# Patient Record
Sex: Male | Born: 1935 | Race: White | Hispanic: No | Marital: Married | Smoking: Former smoker
Health system: Southern US, Community
[De-identification: ages and names within clinical notes are randomized; demographics above are authoritative.]

## PROBLEM LIST (undated history)

## (undated) DIAGNOSIS — K219 Gastro-esophageal reflux disease without esophagitis: Secondary | ICD-10-CM

## (undated) DIAGNOSIS — R3915 Urgency of urination: Secondary | ICD-10-CM

## (undated) DIAGNOSIS — I10 Essential (primary) hypertension: Secondary | ICD-10-CM

## (undated) DIAGNOSIS — I252 Old myocardial infarction: Secondary | ICD-10-CM

## (undated) DIAGNOSIS — J449 Chronic obstructive pulmonary disease, unspecified: Secondary | ICD-10-CM

## (undated) DIAGNOSIS — N201 Calculus of ureter: Secondary | ICD-10-CM

## (undated) DIAGNOSIS — I440 Atrioventricular block, first degree: Secondary | ICD-10-CM

## (undated) DIAGNOSIS — C61 Malignant neoplasm of prostate: Secondary | ICD-10-CM

## (undated) DIAGNOSIS — E785 Hyperlipidemia, unspecified: Secondary | ICD-10-CM

## (undated) DIAGNOSIS — M1990 Unspecified osteoarthritis, unspecified site: Secondary | ICD-10-CM

## (undated) DIAGNOSIS — Z973 Presence of spectacles and contact lenses: Secondary | ICD-10-CM

## (undated) DIAGNOSIS — J4520 Mild intermittent asthma, uncomplicated: Secondary | ICD-10-CM

## (undated) DIAGNOSIS — L309 Dermatitis, unspecified: Secondary | ICD-10-CM

## (undated) DIAGNOSIS — E039 Hypothyroidism, unspecified: Secondary | ICD-10-CM

## (undated) DIAGNOSIS — N200 Calculus of kidney: Secondary | ICD-10-CM

## (undated) DIAGNOSIS — Z9189 Other specified personal risk factors, not elsewhere classified: Secondary | ICD-10-CM

## (undated) DIAGNOSIS — R600 Localized edema: Secondary | ICD-10-CM

## (undated) DIAGNOSIS — M069 Rheumatoid arthritis, unspecified: Secondary | ICD-10-CM

## (undated) DIAGNOSIS — R350 Frequency of micturition: Secondary | ICD-10-CM

## (undated) DIAGNOSIS — I2510 Atherosclerotic heart disease of native coronary artery without angina pectoris: Secondary | ICD-10-CM

## (undated) DIAGNOSIS — B0050 Herpesviral ocular disease, unspecified: Secondary | ICD-10-CM

## (undated) DIAGNOSIS — R972 Elevated prostate specific antigen [PSA]: Secondary | ICD-10-CM

## (undated) DIAGNOSIS — Z974 Presence of external hearing-aid: Secondary | ICD-10-CM

## (undated) DIAGNOSIS — Z87442 Personal history of urinary calculi: Secondary | ICD-10-CM

## (undated) HISTORY — DX: Elevated prostate specific antigen (PSA): R97.20

## (undated) HISTORY — DX: Atherosclerotic heart disease of native coronary artery without angina pectoris: I25.10

## (undated) HISTORY — PX: CORONARY ANGIOPLASTY WITH STENT PLACEMENT: SHX49

## (undated) HISTORY — PX: TONSILLECTOMY: SUR1361

## (undated) HISTORY — DX: Rheumatoid arthritis, unspecified: M06.9

---

## 1999-09-22 HISTORY — PX: TOTAL HIP ARTHROPLASTY: SHX124

## 2004-09-21 HISTORY — PX: PILONIDAL CYST EXCISION: SHX744

## 2006-10-10 DIAGNOSIS — R972 Elevated prostate specific antigen [PSA]: Secondary | ICD-10-CM

## 2006-10-10 HISTORY — DX: Elevated prostate specific antigen (PSA): R97.20

## 2008-09-21 HISTORY — PX: COLONOSCOPY: SHX174

## 2009-08-07 LAB — HM COLONOSCOPY: HM Colonoscopy: 2010

## 2011-11-27 DIAGNOSIS — L570 Actinic keratosis: Secondary | ICD-10-CM | POA: Diagnosis not present

## 2011-11-27 DIAGNOSIS — B356 Tinea cruris: Secondary | ICD-10-CM | POA: Diagnosis not present

## 2011-12-08 DIAGNOSIS — L259 Unspecified contact dermatitis, unspecified cause: Secondary | ICD-10-CM | POA: Diagnosis not present

## 2011-12-11 DIAGNOSIS — H2510 Age-related nuclear cataract, unspecified eye: Secondary | ICD-10-CM | POA: Diagnosis not present

## 2011-12-15 DIAGNOSIS — M76829 Posterior tibial tendinitis, unspecified leg: Secondary | ICD-10-CM | POA: Diagnosis not present

## 2011-12-15 DIAGNOSIS — M79609 Pain in unspecified limb: Secondary | ICD-10-CM | POA: Diagnosis not present

## 2011-12-16 DIAGNOSIS — L259 Unspecified contact dermatitis, unspecified cause: Secondary | ICD-10-CM | POA: Diagnosis not present

## 2011-12-16 DIAGNOSIS — R0602 Shortness of breath: Secondary | ICD-10-CM | POA: Diagnosis not present

## 2011-12-16 DIAGNOSIS — B356 Tinea cruris: Secondary | ICD-10-CM | POA: Diagnosis not present

## 2011-12-17 DIAGNOSIS — H2510 Age-related nuclear cataract, unspecified eye: Secondary | ICD-10-CM | POA: Diagnosis not present

## 2011-12-17 DIAGNOSIS — H269 Unspecified cataract: Secondary | ICD-10-CM | POA: Diagnosis not present

## 2012-01-05 DIAGNOSIS — Z79899 Other long term (current) drug therapy: Secondary | ICD-10-CM | POA: Diagnosis not present

## 2012-01-05 DIAGNOSIS — E7800 Pure hypercholesterolemia, unspecified: Secondary | ICD-10-CM | POA: Diagnosis not present

## 2012-01-05 DIAGNOSIS — M25559 Pain in unspecified hip: Secondary | ICD-10-CM | POA: Diagnosis not present

## 2012-01-11 DIAGNOSIS — H2510 Age-related nuclear cataract, unspecified eye: Secondary | ICD-10-CM | POA: Diagnosis not present

## 2012-01-14 DIAGNOSIS — H2510 Age-related nuclear cataract, unspecified eye: Secondary | ICD-10-CM | POA: Diagnosis not present

## 2012-01-14 DIAGNOSIS — H5200 Hypermetropia, unspecified eye: Secondary | ICD-10-CM | POA: Diagnosis not present

## 2012-01-14 DIAGNOSIS — M5137 Other intervertebral disc degeneration, lumbosacral region: Secondary | ICD-10-CM | POA: Diagnosis not present

## 2012-01-18 DIAGNOSIS — J209 Acute bronchitis, unspecified: Secondary | ICD-10-CM | POA: Diagnosis not present

## 2012-03-04 DIAGNOSIS — H2510 Age-related nuclear cataract, unspecified eye: Secondary | ICD-10-CM | POA: Diagnosis not present

## 2012-03-21 DIAGNOSIS — H269 Unspecified cataract: Secondary | ICD-10-CM | POA: Diagnosis not present

## 2012-03-21 DIAGNOSIS — H52209 Unspecified astigmatism, unspecified eye: Secondary | ICD-10-CM | POA: Diagnosis not present

## 2012-03-21 DIAGNOSIS — H2510 Age-related nuclear cataract, unspecified eye: Secondary | ICD-10-CM | POA: Diagnosis not present

## 2012-04-14 DIAGNOSIS — Z9861 Coronary angioplasty status: Secondary | ICD-10-CM | POA: Diagnosis not present

## 2012-04-14 DIAGNOSIS — N289 Disorder of kidney and ureter, unspecified: Secondary | ICD-10-CM | POA: Diagnosis not present

## 2012-04-14 DIAGNOSIS — I252 Old myocardial infarction: Secondary | ICD-10-CM | POA: Diagnosis not present

## 2012-04-14 DIAGNOSIS — I2510 Atherosclerotic heart disease of native coronary artery without angina pectoris: Secondary | ICD-10-CM | POA: Diagnosis not present

## 2012-05-09 DIAGNOSIS — R972 Elevated prostate specific antigen [PSA]: Secondary | ICD-10-CM | POA: Diagnosis not present

## 2012-05-09 DIAGNOSIS — N400 Enlarged prostate without lower urinary tract symptoms: Secondary | ICD-10-CM | POA: Diagnosis not present

## 2012-05-09 DIAGNOSIS — N201 Calculus of ureter: Secondary | ICD-10-CM | POA: Diagnosis not present

## 2012-05-09 DIAGNOSIS — R350 Frequency of micturition: Secondary | ICD-10-CM | POA: Diagnosis not present

## 2012-05-12 DIAGNOSIS — R350 Frequency of micturition: Secondary | ICD-10-CM | POA: Diagnosis not present

## 2012-05-12 DIAGNOSIS — N201 Calculus of ureter: Secondary | ICD-10-CM | POA: Diagnosis not present

## 2012-05-12 DIAGNOSIS — R972 Elevated prostate specific antigen [PSA]: Secondary | ICD-10-CM | POA: Diagnosis not present

## 2012-05-12 DIAGNOSIS — N400 Enlarged prostate without lower urinary tract symptoms: Secondary | ICD-10-CM | POA: Diagnosis not present

## 2012-05-16 DIAGNOSIS — H169 Unspecified keratitis: Secondary | ICD-10-CM | POA: Diagnosis not present

## 2012-05-17 DIAGNOSIS — Z0000 Encounter for general adult medical examination without abnormal findings: Secondary | ICD-10-CM | POA: Diagnosis not present

## 2012-05-17 DIAGNOSIS — N400 Enlarged prostate without lower urinary tract symptoms: Secondary | ICD-10-CM | POA: Diagnosis not present

## 2012-05-17 DIAGNOSIS — M1990 Unspecified osteoarthritis, unspecified site: Secondary | ICD-10-CM | POA: Diagnosis not present

## 2012-05-17 DIAGNOSIS — I2510 Atherosclerotic heart disease of native coronary artery without angina pectoris: Secondary | ICD-10-CM | POA: Diagnosis not present

## 2012-05-19 DIAGNOSIS — B0052 Herpesviral keratitis: Secondary | ICD-10-CM | POA: Diagnosis not present

## 2012-05-25 DIAGNOSIS — I6529 Occlusion and stenosis of unspecified carotid artery: Secondary | ICD-10-CM | POA: Diagnosis not present

## 2012-05-30 DIAGNOSIS — I2510 Atherosclerotic heart disease of native coronary artery without angina pectoris: Secondary | ICD-10-CM | POA: Diagnosis not present

## 2012-05-30 DIAGNOSIS — D649 Anemia, unspecified: Secondary | ICD-10-CM | POA: Diagnosis not present

## 2012-05-30 DIAGNOSIS — Z23 Encounter for immunization: Secondary | ICD-10-CM | POA: Diagnosis not present

## 2012-06-02 DIAGNOSIS — B0052 Herpesviral keratitis: Secondary | ICD-10-CM | POA: Diagnosis not present

## 2012-06-02 DIAGNOSIS — Z961 Presence of intraocular lens: Secondary | ICD-10-CM | POA: Diagnosis not present

## 2012-06-21 DIAGNOSIS — B0052 Herpesviral keratitis: Secondary | ICD-10-CM | POA: Diagnosis not present

## 2012-06-21 DIAGNOSIS — M1990 Unspecified osteoarthritis, unspecified site: Secondary | ICD-10-CM | POA: Diagnosis not present

## 2012-06-21 DIAGNOSIS — M25559 Pain in unspecified hip: Secondary | ICD-10-CM | POA: Diagnosis not present

## 2012-09-21 HISTORY — PX: CATARACT EXTRACTION W/ INTRAOCULAR LENS  IMPLANT, BILATERAL: SHX1307

## 2012-10-04 DIAGNOSIS — B0050 Herpesviral ocular disease, unspecified: Secondary | ICD-10-CM | POA: Insufficient documentation

## 2012-10-04 DIAGNOSIS — I10 Essential (primary) hypertension: Secondary | ICD-10-CM | POA: Insufficient documentation

## 2012-10-04 DIAGNOSIS — E785 Hyperlipidemia, unspecified: Secondary | ICD-10-CM | POA: Insufficient documentation

## 2012-10-05 DIAGNOSIS — R269 Unspecified abnormalities of gait and mobility: Secondary | ICD-10-CM | POA: Diagnosis not present

## 2012-10-05 DIAGNOSIS — I10 Essential (primary) hypertension: Secondary | ICD-10-CM | POA: Diagnosis not present

## 2012-10-05 DIAGNOSIS — L2089 Other atopic dermatitis: Secondary | ICD-10-CM | POA: Diagnosis not present

## 2012-10-05 DIAGNOSIS — E785 Hyperlipidemia, unspecified: Secondary | ICD-10-CM | POA: Diagnosis not present

## 2012-10-06 DIAGNOSIS — E785 Hyperlipidemia, unspecified: Secondary | ICD-10-CM | POA: Diagnosis not present

## 2012-10-06 LAB — LIPID PANEL
Cholesterol: 124 mg/dL (ref 0–200)
HDL: 48 mg/dL (ref 35–70)
Triglycerides: 69 mg/dL (ref 40–160)

## 2012-10-07 DIAGNOSIS — R609 Edema, unspecified: Secondary | ICD-10-CM | POA: Insufficient documentation

## 2012-10-07 DIAGNOSIS — R269 Unspecified abnormalities of gait and mobility: Secondary | ICD-10-CM | POA: Insufficient documentation

## 2012-10-07 DIAGNOSIS — L309 Dermatitis, unspecified: Secondary | ICD-10-CM | POA: Insufficient documentation

## 2012-10-10 DIAGNOSIS — E669 Obesity, unspecified: Secondary | ICD-10-CM | POA: Diagnosis not present

## 2012-10-10 DIAGNOSIS — R269 Unspecified abnormalities of gait and mobility: Secondary | ICD-10-CM | POA: Diagnosis not present

## 2012-10-10 DIAGNOSIS — M1990 Unspecified osteoarthritis, unspecified site: Secondary | ICD-10-CM | POA: Diagnosis not present

## 2012-10-10 DIAGNOSIS — E785 Hyperlipidemia, unspecified: Secondary | ICD-10-CM | POA: Diagnosis not present

## 2012-11-10 DIAGNOSIS — M6281 Muscle weakness (generalized): Secondary | ICD-10-CM | POA: Diagnosis not present

## 2012-11-10 DIAGNOSIS — M159 Polyosteoarthritis, unspecified: Secondary | ICD-10-CM | POA: Diagnosis not present

## 2012-11-10 DIAGNOSIS — R269 Unspecified abnormalities of gait and mobility: Secondary | ICD-10-CM | POA: Diagnosis not present

## 2012-11-10 DIAGNOSIS — R279 Unspecified lack of coordination: Secondary | ICD-10-CM | POA: Diagnosis not present

## 2012-11-14 DIAGNOSIS — M159 Polyosteoarthritis, unspecified: Secondary | ICD-10-CM | POA: Diagnosis not present

## 2012-11-14 DIAGNOSIS — M6281 Muscle weakness (generalized): Secondary | ICD-10-CM | POA: Diagnosis not present

## 2012-11-14 DIAGNOSIS — R269 Unspecified abnormalities of gait and mobility: Secondary | ICD-10-CM | POA: Diagnosis not present

## 2012-11-14 DIAGNOSIS — R279 Unspecified lack of coordination: Secondary | ICD-10-CM | POA: Diagnosis not present

## 2012-11-16 DIAGNOSIS — R269 Unspecified abnormalities of gait and mobility: Secondary | ICD-10-CM | POA: Diagnosis not present

## 2012-11-16 DIAGNOSIS — R279 Unspecified lack of coordination: Secondary | ICD-10-CM | POA: Diagnosis not present

## 2012-11-16 DIAGNOSIS — M159 Polyosteoarthritis, unspecified: Secondary | ICD-10-CM | POA: Diagnosis not present

## 2012-11-16 DIAGNOSIS — M6281 Muscle weakness (generalized): Secondary | ICD-10-CM | POA: Diagnosis not present

## 2012-11-17 DIAGNOSIS — R279 Unspecified lack of coordination: Secondary | ICD-10-CM | POA: Diagnosis not present

## 2012-11-17 DIAGNOSIS — R269 Unspecified abnormalities of gait and mobility: Secondary | ICD-10-CM | POA: Diagnosis not present

## 2012-11-17 DIAGNOSIS — M159 Polyosteoarthritis, unspecified: Secondary | ICD-10-CM | POA: Diagnosis not present

## 2012-11-17 DIAGNOSIS — M6281 Muscle weakness (generalized): Secondary | ICD-10-CM | POA: Diagnosis not present

## 2012-11-22 DIAGNOSIS — M6281 Muscle weakness (generalized): Secondary | ICD-10-CM | POA: Diagnosis not present

## 2012-11-22 DIAGNOSIS — R269 Unspecified abnormalities of gait and mobility: Secondary | ICD-10-CM | POA: Diagnosis not present

## 2012-11-22 DIAGNOSIS — R279 Unspecified lack of coordination: Secondary | ICD-10-CM | POA: Diagnosis not present

## 2012-11-22 DIAGNOSIS — M159 Polyosteoarthritis, unspecified: Secondary | ICD-10-CM | POA: Diagnosis not present

## 2012-11-23 DIAGNOSIS — R279 Unspecified lack of coordination: Secondary | ICD-10-CM | POA: Diagnosis not present

## 2012-11-23 DIAGNOSIS — M6281 Muscle weakness (generalized): Secondary | ICD-10-CM | POA: Diagnosis not present

## 2012-11-23 DIAGNOSIS — M159 Polyosteoarthritis, unspecified: Secondary | ICD-10-CM | POA: Diagnosis not present

## 2012-11-23 DIAGNOSIS — R269 Unspecified abnormalities of gait and mobility: Secondary | ICD-10-CM | POA: Diagnosis not present

## 2012-11-28 DIAGNOSIS — M159 Polyosteoarthritis, unspecified: Secondary | ICD-10-CM | POA: Diagnosis not present

## 2012-11-28 DIAGNOSIS — R279 Unspecified lack of coordination: Secondary | ICD-10-CM | POA: Diagnosis not present

## 2012-11-28 DIAGNOSIS — R269 Unspecified abnormalities of gait and mobility: Secondary | ICD-10-CM | POA: Diagnosis not present

## 2012-11-28 DIAGNOSIS — M6281 Muscle weakness (generalized): Secondary | ICD-10-CM | POA: Diagnosis not present

## 2012-11-29 DIAGNOSIS — M6281 Muscle weakness (generalized): Secondary | ICD-10-CM | POA: Diagnosis not present

## 2012-11-29 DIAGNOSIS — R269 Unspecified abnormalities of gait and mobility: Secondary | ICD-10-CM | POA: Diagnosis not present

## 2012-11-29 DIAGNOSIS — R279 Unspecified lack of coordination: Secondary | ICD-10-CM | POA: Diagnosis not present

## 2012-11-29 DIAGNOSIS — M159 Polyosteoarthritis, unspecified: Secondary | ICD-10-CM | POA: Diagnosis not present

## 2012-12-05 DIAGNOSIS — M6281 Muscle weakness (generalized): Secondary | ICD-10-CM | POA: Diagnosis not present

## 2012-12-05 DIAGNOSIS — R279 Unspecified lack of coordination: Secondary | ICD-10-CM | POA: Diagnosis not present

## 2012-12-05 DIAGNOSIS — M159 Polyosteoarthritis, unspecified: Secondary | ICD-10-CM | POA: Diagnosis not present

## 2012-12-05 DIAGNOSIS — R269 Unspecified abnormalities of gait and mobility: Secondary | ICD-10-CM | POA: Diagnosis not present

## 2012-12-06 DIAGNOSIS — R269 Unspecified abnormalities of gait and mobility: Secondary | ICD-10-CM | POA: Diagnosis not present

## 2012-12-06 DIAGNOSIS — R279 Unspecified lack of coordination: Secondary | ICD-10-CM | POA: Diagnosis not present

## 2012-12-06 DIAGNOSIS — M6281 Muscle weakness (generalized): Secondary | ICD-10-CM | POA: Diagnosis not present

## 2012-12-06 DIAGNOSIS — M159 Polyosteoarthritis, unspecified: Secondary | ICD-10-CM | POA: Diagnosis not present

## 2012-12-07 ENCOUNTER — Telehealth: Payer: Self-pay | Admitting: *Deleted

## 2012-12-07 NOTE — Telephone Encounter (Signed)
Patient stated that he is going to Dr Elmon Else or [facility] office tomorrow. He will keep [place] posted

## 2012-12-07 NOTE — Telephone Encounter (Signed)
Message copied by Mariana Kaufman on Wed Dec 07, 2012  4:54 PM ------      Message from: Kimber Relic      Created: Wed Dec 07, 2012  2:02 PM      Regarding: Referral to dermatologist      Contact: [phone]       [facility] # [id number]      Left msg on my cell phone that he needs referral to Derm for a rash. I am OK with any Drematologist he wants. If no preference, refer to Dr. Margo Aye or Terri Piedra. ------

## 2012-12-08 DIAGNOSIS — L538 Other specified erythematous conditions: Secondary | ICD-10-CM | POA: Diagnosis not present

## 2012-12-08 DIAGNOSIS — I872 Venous insufficiency (chronic) (peripheral): Secondary | ICD-10-CM | POA: Diagnosis not present

## 2012-12-12 DIAGNOSIS — R269 Unspecified abnormalities of gait and mobility: Secondary | ICD-10-CM | POA: Diagnosis not present

## 2012-12-12 DIAGNOSIS — R279 Unspecified lack of coordination: Secondary | ICD-10-CM | POA: Diagnosis not present

## 2012-12-12 DIAGNOSIS — M6281 Muscle weakness (generalized): Secondary | ICD-10-CM | POA: Diagnosis not present

## 2012-12-12 DIAGNOSIS — M159 Polyosteoarthritis, unspecified: Secondary | ICD-10-CM | POA: Diagnosis not present

## 2012-12-13 DIAGNOSIS — M159 Polyosteoarthritis, unspecified: Secondary | ICD-10-CM | POA: Diagnosis not present

## 2012-12-13 DIAGNOSIS — R269 Unspecified abnormalities of gait and mobility: Secondary | ICD-10-CM | POA: Diagnosis not present

## 2012-12-13 DIAGNOSIS — R279 Unspecified lack of coordination: Secondary | ICD-10-CM | POA: Diagnosis not present

## 2012-12-13 DIAGNOSIS — M6281 Muscle weakness (generalized): Secondary | ICD-10-CM | POA: Diagnosis not present

## 2012-12-15 DIAGNOSIS — M159 Polyosteoarthritis, unspecified: Secondary | ICD-10-CM | POA: Diagnosis not present

## 2012-12-15 DIAGNOSIS — R279 Unspecified lack of coordination: Secondary | ICD-10-CM | POA: Diagnosis not present

## 2012-12-15 DIAGNOSIS — R269 Unspecified abnormalities of gait and mobility: Secondary | ICD-10-CM | POA: Diagnosis not present

## 2012-12-15 DIAGNOSIS — M6281 Muscle weakness (generalized): Secondary | ICD-10-CM | POA: Diagnosis not present

## 2012-12-20 DIAGNOSIS — M6281 Muscle weakness (generalized): Secondary | ICD-10-CM | POA: Diagnosis not present

## 2012-12-20 DIAGNOSIS — M159 Polyosteoarthritis, unspecified: Secondary | ICD-10-CM | POA: Diagnosis not present

## 2012-12-20 DIAGNOSIS — R279 Unspecified lack of coordination: Secondary | ICD-10-CM | POA: Diagnosis not present

## 2012-12-20 DIAGNOSIS — R269 Unspecified abnormalities of gait and mobility: Secondary | ICD-10-CM | POA: Diagnosis not present

## 2012-12-21 DIAGNOSIS — R269 Unspecified abnormalities of gait and mobility: Secondary | ICD-10-CM | POA: Diagnosis not present

## 2012-12-21 DIAGNOSIS — R279 Unspecified lack of coordination: Secondary | ICD-10-CM | POA: Diagnosis not present

## 2012-12-21 DIAGNOSIS — M6281 Muscle weakness (generalized): Secondary | ICD-10-CM | POA: Diagnosis not present

## 2012-12-21 DIAGNOSIS — M159 Polyosteoarthritis, unspecified: Secondary | ICD-10-CM | POA: Diagnosis not present

## 2012-12-22 DIAGNOSIS — R279 Unspecified lack of coordination: Secondary | ICD-10-CM | POA: Diagnosis not present

## 2012-12-22 DIAGNOSIS — R269 Unspecified abnormalities of gait and mobility: Secondary | ICD-10-CM | POA: Diagnosis not present

## 2012-12-22 DIAGNOSIS — M159 Polyosteoarthritis, unspecified: Secondary | ICD-10-CM | POA: Diagnosis not present

## 2012-12-22 DIAGNOSIS — M6281 Muscle weakness (generalized): Secondary | ICD-10-CM | POA: Diagnosis not present

## 2012-12-26 DIAGNOSIS — R279 Unspecified lack of coordination: Secondary | ICD-10-CM | POA: Diagnosis not present

## 2012-12-26 DIAGNOSIS — R269 Unspecified abnormalities of gait and mobility: Secondary | ICD-10-CM | POA: Diagnosis not present

## 2012-12-26 DIAGNOSIS — M159 Polyosteoarthritis, unspecified: Secondary | ICD-10-CM | POA: Diagnosis not present

## 2012-12-26 DIAGNOSIS — M6281 Muscle weakness (generalized): Secondary | ICD-10-CM | POA: Diagnosis not present

## 2013-01-11 DIAGNOSIS — B0052 Herpesviral keratitis: Secondary | ICD-10-CM | POA: Diagnosis not present

## 2013-01-11 DIAGNOSIS — Z961 Presence of intraocular lens: Secondary | ICD-10-CM | POA: Diagnosis not present

## 2013-01-11 DIAGNOSIS — H35369 Drusen (degenerative) of macula, unspecified eye: Secondary | ICD-10-CM | POA: Diagnosis not present

## 2013-01-13 DIAGNOSIS — M67919 Unspecified disorder of synovium and tendon, unspecified shoulder: Secondary | ICD-10-CM | POA: Diagnosis not present

## 2013-01-13 DIAGNOSIS — M719 Bursopathy, unspecified: Secondary | ICD-10-CM | POA: Diagnosis not present

## 2013-02-07 DIAGNOSIS — E7800 Pure hypercholesterolemia, unspecified: Secondary | ICD-10-CM | POA: Diagnosis not present

## 2013-02-07 DIAGNOSIS — I2510 Atherosclerotic heart disease of native coronary artery without angina pectoris: Secondary | ICD-10-CM | POA: Diagnosis not present

## 2013-02-07 DIAGNOSIS — J40 Bronchitis, not specified as acute or chronic: Secondary | ICD-10-CM | POA: Diagnosis not present

## 2013-02-07 DIAGNOSIS — J45909 Unspecified asthma, uncomplicated: Secondary | ICD-10-CM | POA: Diagnosis not present

## 2013-02-14 DIAGNOSIS — E7800 Pure hypercholesterolemia, unspecified: Secondary | ICD-10-CM | POA: Diagnosis not present

## 2013-02-14 DIAGNOSIS — R059 Cough, unspecified: Secondary | ICD-10-CM | POA: Diagnosis not present

## 2013-02-14 DIAGNOSIS — R0989 Other specified symptoms and signs involving the circulatory and respiratory systems: Secondary | ICD-10-CM | POA: Diagnosis not present

## 2013-02-14 DIAGNOSIS — J45909 Unspecified asthma, uncomplicated: Secondary | ICD-10-CM | POA: Diagnosis not present

## 2013-02-14 DIAGNOSIS — J40 Bronchitis, not specified as acute or chronic: Secondary | ICD-10-CM | POA: Diagnosis not present

## 2013-02-14 DIAGNOSIS — I2510 Atherosclerotic heart disease of native coronary artery without angina pectoris: Secondary | ICD-10-CM | POA: Diagnosis not present

## 2013-02-14 DIAGNOSIS — R05 Cough: Secondary | ICD-10-CM | POA: Diagnosis not present

## 2013-02-15 DIAGNOSIS — M25549 Pain in joints of unspecified hand: Secondary | ICD-10-CM | POA: Diagnosis not present

## 2013-02-15 DIAGNOSIS — M67919 Unspecified disorder of synovium and tendon, unspecified shoulder: Secondary | ICD-10-CM | POA: Diagnosis not present

## 2013-02-15 DIAGNOSIS — Z0189 Encounter for other specified special examinations: Secondary | ICD-10-CM | POA: Diagnosis not present

## 2013-02-28 DIAGNOSIS — M9981 Other biomechanical lesions of cervical region: Secondary | ICD-10-CM | POA: Diagnosis not present

## 2013-02-28 DIAGNOSIS — S239XXA Sprain of unspecified parts of thorax, initial encounter: Secondary | ICD-10-CM | POA: Diagnosis not present

## 2013-02-28 DIAGNOSIS — M999 Biomechanical lesion, unspecified: Secondary | ICD-10-CM | POA: Diagnosis not present

## 2013-03-06 DIAGNOSIS — M999 Biomechanical lesion, unspecified: Secondary | ICD-10-CM | POA: Diagnosis not present

## 2013-03-06 DIAGNOSIS — S239XXA Sprain of unspecified parts of thorax, initial encounter: Secondary | ICD-10-CM | POA: Diagnosis not present

## 2013-03-06 DIAGNOSIS — M9981 Other biomechanical lesions of cervical region: Secondary | ICD-10-CM | POA: Diagnosis not present

## 2013-03-07 DIAGNOSIS — M9981 Other biomechanical lesions of cervical region: Secondary | ICD-10-CM | POA: Diagnosis not present

## 2013-03-07 DIAGNOSIS — M999 Biomechanical lesion, unspecified: Secondary | ICD-10-CM | POA: Diagnosis not present

## 2013-03-07 DIAGNOSIS — S239XXA Sprain of unspecified parts of thorax, initial encounter: Secondary | ICD-10-CM | POA: Diagnosis not present

## 2013-03-13 DIAGNOSIS — S239XXA Sprain of unspecified parts of thorax, initial encounter: Secondary | ICD-10-CM | POA: Diagnosis not present

## 2013-03-13 DIAGNOSIS — M999 Biomechanical lesion, unspecified: Secondary | ICD-10-CM | POA: Diagnosis not present

## 2013-03-13 DIAGNOSIS — M9981 Other biomechanical lesions of cervical region: Secondary | ICD-10-CM | POA: Diagnosis not present

## 2013-03-15 DIAGNOSIS — M999 Biomechanical lesion, unspecified: Secondary | ICD-10-CM | POA: Diagnosis not present

## 2013-03-15 DIAGNOSIS — S239XXA Sprain of unspecified parts of thorax, initial encounter: Secondary | ICD-10-CM | POA: Diagnosis not present

## 2013-03-15 DIAGNOSIS — M9981 Other biomechanical lesions of cervical region: Secondary | ICD-10-CM | POA: Diagnosis not present

## 2013-03-16 DIAGNOSIS — Z0189 Encounter for other specified special examinations: Secondary | ICD-10-CM | POA: Diagnosis not present

## 2013-03-16 DIAGNOSIS — M1990 Unspecified osteoarthritis, unspecified site: Secondary | ICD-10-CM | POA: Diagnosis not present

## 2013-03-16 DIAGNOSIS — R972 Elevated prostate specific antigen [PSA]: Secondary | ICD-10-CM | POA: Diagnosis not present

## 2013-03-16 DIAGNOSIS — D649 Anemia, unspecified: Secondary | ICD-10-CM | POA: Diagnosis not present

## 2013-03-16 DIAGNOSIS — I00 Rheumatic fever without heart involvement: Secondary | ICD-10-CM | POA: Diagnosis not present

## 2013-03-20 DIAGNOSIS — M9981 Other biomechanical lesions of cervical region: Secondary | ICD-10-CM | POA: Diagnosis not present

## 2013-03-20 DIAGNOSIS — M999 Biomechanical lesion, unspecified: Secondary | ICD-10-CM | POA: Diagnosis not present

## 2013-03-20 DIAGNOSIS — S239XXA Sprain of unspecified parts of thorax, initial encounter: Secondary | ICD-10-CM | POA: Diagnosis not present

## 2013-03-23 DIAGNOSIS — N400 Enlarged prostate without lower urinary tract symptoms: Secondary | ICD-10-CM | POA: Diagnosis not present

## 2013-03-23 DIAGNOSIS — D649 Anemia, unspecified: Secondary | ICD-10-CM | POA: Diagnosis not present

## 2013-03-23 DIAGNOSIS — I2510 Atherosclerotic heart disease of native coronary artery without angina pectoris: Secondary | ICD-10-CM | POA: Diagnosis not present

## 2013-03-27 DIAGNOSIS — E7800 Pure hypercholesterolemia, unspecified: Secondary | ICD-10-CM | POA: Diagnosis not present

## 2013-03-27 DIAGNOSIS — M9981 Other biomechanical lesions of cervical region: Secondary | ICD-10-CM | POA: Diagnosis not present

## 2013-03-27 DIAGNOSIS — M129 Arthropathy, unspecified: Secondary | ICD-10-CM | POA: Diagnosis not present

## 2013-03-27 DIAGNOSIS — D649 Anemia, unspecified: Secondary | ICD-10-CM | POA: Diagnosis not present

## 2013-03-27 DIAGNOSIS — S239XXA Sprain of unspecified parts of thorax, initial encounter: Secondary | ICD-10-CM | POA: Diagnosis not present

## 2013-03-27 DIAGNOSIS — I2510 Atherosclerotic heart disease of native coronary artery without angina pectoris: Secondary | ICD-10-CM | POA: Diagnosis not present

## 2013-03-27 DIAGNOSIS — M999 Biomechanical lesion, unspecified: Secondary | ICD-10-CM | POA: Diagnosis not present

## 2013-04-04 DIAGNOSIS — S239XXA Sprain of unspecified parts of thorax, initial encounter: Secondary | ICD-10-CM | POA: Diagnosis not present

## 2013-04-04 DIAGNOSIS — M9981 Other biomechanical lesions of cervical region: Secondary | ICD-10-CM | POA: Diagnosis not present

## 2013-04-04 DIAGNOSIS — M999 Biomechanical lesion, unspecified: Secondary | ICD-10-CM | POA: Diagnosis not present

## 2013-04-10 DIAGNOSIS — M999 Biomechanical lesion, unspecified: Secondary | ICD-10-CM | POA: Diagnosis not present

## 2013-04-10 DIAGNOSIS — M9981 Other biomechanical lesions of cervical region: Secondary | ICD-10-CM | POA: Diagnosis not present

## 2013-04-10 DIAGNOSIS — S239XXA Sprain of unspecified parts of thorax, initial encounter: Secondary | ICD-10-CM | POA: Diagnosis not present

## 2013-04-11 DIAGNOSIS — M2550 Pain in unspecified joint: Secondary | ICD-10-CM | POA: Diagnosis not present

## 2013-04-11 DIAGNOSIS — M129 Arthropathy, unspecified: Secondary | ICD-10-CM | POA: Diagnosis not present

## 2013-04-11 DIAGNOSIS — M064 Inflammatory polyarthropathy: Secondary | ICD-10-CM | POA: Diagnosis not present

## 2013-04-11 DIAGNOSIS — IMO0001 Reserved for inherently not codable concepts without codable children: Secondary | ICD-10-CM | POA: Diagnosis not present

## 2013-04-11 DIAGNOSIS — Z0189 Encounter for other specified special examinations: Secondary | ICD-10-CM | POA: Diagnosis not present

## 2013-04-11 DIAGNOSIS — Z79899 Other long term (current) drug therapy: Secondary | ICD-10-CM | POA: Diagnosis not present

## 2013-04-14 DIAGNOSIS — I10 Essential (primary) hypertension: Secondary | ICD-10-CM | POA: Diagnosis not present

## 2013-04-14 DIAGNOSIS — I6529 Occlusion and stenosis of unspecified carotid artery: Secondary | ICD-10-CM | POA: Diagnosis not present

## 2013-04-14 DIAGNOSIS — E7800 Pure hypercholesterolemia, unspecified: Secondary | ICD-10-CM | POA: Diagnosis not present

## 2013-04-14 DIAGNOSIS — I2510 Atherosclerotic heart disease of native coronary artery without angina pectoris: Secondary | ICD-10-CM | POA: Diagnosis not present

## 2013-04-19 DIAGNOSIS — M19049 Primary osteoarthritis, unspecified hand: Secondary | ICD-10-CM | POA: Diagnosis not present

## 2013-04-19 DIAGNOSIS — M069 Rheumatoid arthritis, unspecified: Secondary | ICD-10-CM | POA: Diagnosis not present

## 2013-05-09 DIAGNOSIS — M069 Rheumatoid arthritis, unspecified: Secondary | ICD-10-CM | POA: Diagnosis not present

## 2013-05-12 DIAGNOSIS — N201 Calculus of ureter: Secondary | ICD-10-CM | POA: Diagnosis not present

## 2013-05-12 DIAGNOSIS — R350 Frequency of micturition: Secondary | ICD-10-CM | POA: Diagnosis not present

## 2013-05-12 DIAGNOSIS — R972 Elevated prostate specific antigen [PSA]: Secondary | ICD-10-CM | POA: Diagnosis not present

## 2013-05-12 DIAGNOSIS — N400 Enlarged prostate without lower urinary tract symptoms: Secondary | ICD-10-CM | POA: Diagnosis not present

## 2013-05-22 DIAGNOSIS — M069 Rheumatoid arthritis, unspecified: Secondary | ICD-10-CM

## 2013-05-22 HISTORY — DX: Rheumatoid arthritis, unspecified: M06.9

## 2013-05-23 DIAGNOSIS — H43819 Vitreous degeneration, unspecified eye: Secondary | ICD-10-CM | POA: Diagnosis not present

## 2013-05-23 DIAGNOSIS — H35369 Drusen (degenerative) of macula, unspecified eye: Secondary | ICD-10-CM | POA: Diagnosis not present

## 2013-05-23 DIAGNOSIS — B0052 Herpesviral keratitis: Secondary | ICD-10-CM | POA: Diagnosis not present

## 2013-05-26 DIAGNOSIS — R972 Elevated prostate specific antigen [PSA]: Secondary | ICD-10-CM | POA: Diagnosis not present

## 2013-05-26 DIAGNOSIS — R350 Frequency of micturition: Secondary | ICD-10-CM | POA: Diagnosis not present

## 2013-05-26 DIAGNOSIS — N201 Calculus of ureter: Secondary | ICD-10-CM | POA: Diagnosis not present

## 2013-05-26 DIAGNOSIS — N400 Enlarged prostate without lower urinary tract symptoms: Secondary | ICD-10-CM | POA: Diagnosis not present

## 2013-06-05 DIAGNOSIS — Z23 Encounter for immunization: Secondary | ICD-10-CM | POA: Diagnosis not present

## 2013-06-06 DIAGNOSIS — M7989 Other specified soft tissue disorders: Secondary | ICD-10-CM | POA: Diagnosis not present

## 2013-06-06 DIAGNOSIS — M79609 Pain in unspecified limb: Secondary | ICD-10-CM | POA: Diagnosis not present

## 2013-06-06 DIAGNOSIS — M069 Rheumatoid arthritis, unspecified: Secondary | ICD-10-CM | POA: Diagnosis not present

## 2013-07-10 ENCOUNTER — Encounter: Payer: Self-pay | Admitting: Internal Medicine

## 2013-08-07 ENCOUNTER — Encounter: Payer: Self-pay | Admitting: Geriatric Medicine

## 2013-08-07 ENCOUNTER — Non-Acute Institutional Stay: Payer: Medicare Other | Admitting: Geriatric Medicine

## 2013-08-07 DIAGNOSIS — M069 Rheumatoid arthritis, unspecified: Secondary | ICD-10-CM

## 2013-08-07 DIAGNOSIS — I10 Essential (primary) hypertension: Secondary | ICD-10-CM | POA: Diagnosis not present

## 2013-08-07 DIAGNOSIS — R269 Unspecified abnormalities of gait and mobility: Secondary | ICD-10-CM

## 2013-08-07 DIAGNOSIS — R609 Edema, unspecified: Secondary | ICD-10-CM

## 2013-08-07 DIAGNOSIS — L259 Unspecified contact dermatitis, unspecified cause: Secondary | ICD-10-CM

## 2013-08-07 DIAGNOSIS — E785 Hyperlipidemia, unspecified: Secondary | ICD-10-CM | POA: Diagnosis not present

## 2013-08-07 DIAGNOSIS — B0050 Herpesviral ocular disease, unspecified: Secondary | ICD-10-CM | POA: Diagnosis not present

## 2013-08-07 DIAGNOSIS — L309 Dermatitis, unspecified: Secondary | ICD-10-CM

## 2013-08-07 NOTE — Progress Notes (Signed)
Patient ID: Kevin Kane, male   DOB: 1930/06/21, 79 y.o.   MRN: [id number]  [facility] [phone])  Code Status:Full Code   Contact Information   Name Relation Home Work Mobile   Camplin,Susan Spouse [phone]  [phone]      Chief Complaint  Patient presents with  . Medical Managment of Chronic Issues    blood pressure, cholesterol, obesity, CAD, eczema    HPI: This is an 79 y.o. male resident of [facility], Independent Living section evaluated today for management of ongoing medical issues. Patient returned from his home in [place] approximately one month ago. He reports that he was seen by her primary care provider and cardiologist recently , lab work was performed. No changes regarding coronary disease, blood pressure or cholesterol management.   Patient has developed new problems over the last few months. He had a torn rotator cuff on the left. Was evaluated by orthopedics, had an injection, the pain was relieved. Sometime after this he developed pain in his shoulder and fingers. He was evaluated by a rheumatologist who diagnosed rheumatoid arthritis. He has been taking a prednisone taper with good relief of his pain symptoms. Patient has also been started on low dose Plaquenil.   A few weeks ago patient was in [place], had a cough with wheezing. The cough had been present for a while, he felt very bad. He arranged to be seen by a physician who did housecalls. Patient was diagnosed with sinusitis and prescribed a 3 week course of Augmentin. Cough has resolved, no wheezing.  The patient feels generally very well today, he does have a pain in his right calf that comes and goes. This was occurring during the summer he was evaluated and told he did not have a blood clot in that leg. Pain does not occur when he's at rest, it does happen while he is walking. Feels like a cramp in his leg.    Allergies  Allergen Reactions  .  Ketoconazole       Medication List       This list is accurate as of: 08/07/13  5:20 PM.  Always use your most recent med list.               acyclovir 400 MG tablet  Commonly known as:  ZOVIRAX  Take 400 mg by mouth. Take one tablet daily for virus in eyes     amoxicillin 875 MG tablet  Commonly known as:  AMOXIL  Take 875 mg by mouth 2 (two) times daily.     aspirin 81 MG tablet  Take 81 mg by mouth daily.     atorvastatin 80 MG tablet  Commonly known as:  LIPITOR  Take 80 mg by mouth. Take one daily for cholesterol     fluocinolone 0.025 % cream  Commonly known as:  SYNALAR  Apply topically as needed. FOR ECZEMA     hydroxychloroquine 200 MG tablet  Commonly known as:  PLAQUENIL  Take 200 mg by mouth daily.     levothyroxine 25 MCG tablet  Commonly known as:  SYNTHROID, LEVOTHROID  Take 25 mcg by mouth daily before breakfast.     metoprolol succinate 50 MG 24 hr tablet  Commonly known as:  TOPROL-XL  Take 50 mg by mouth. Take one tablet daily for blood pressure     multivitamin tablet  Take 1 tablet by mouth daily.     nitroGLYCERIN 0.4 MG SL tablet  Commonly known  as:  NITROSTAT  Place 0.4 mg under the tongue every 5 (five) minutes as needed for chest pain.     predniSONE 5 MG tablet  Commonly known as:  DELTASONE  Take 5 mg by mouth 2 (two) times daily with a meal. Patient is tapering off medicaiton     triamcinolone cream 0.1 %  Commonly known as:  KENALOG  Apply 1 application topically. Apply 1-2 times daily behind ear and belly button        DATA REVIEWED  Radiologic Exams:   Cardiovascular Exams:   Laboratory Studies  05/30/2012 Dr. Fara Boros office labs  WBC 9.7, hemoglobin 12.9, hematocrit 40.2, platelet 293  B12 425.3  Iron 75  Sodium 145, potassium 4.3, chloride 109, glucose 107, BUN 26, creatinine 1.1  Lab Results- [facility]  Component Value Date   CHOL 124 10/06/2012   TRIG 69 10/06/2012   HDL 48 10/06/2012   LDLCALC 62  10/06/2012   History   Social History Narrative   Patient is Married since 1955. Retired Psychologist, educational. Lives in single level home, Independent Living section at [facility] since 2013.  Spends half the year at home in [place].    Stopped smoking 1964, Moderate alcohol intake, 5 glasses wine/ week    Minimal exercise, walking. Has pet cat.   Patient has no Advanced planning documents                Review of Systems  DATA OBTAINED: from patient GENERAL: Feels well   No fevers, fatigue, change in appetite or weight SKIN: No rash or open wounds. Dry itchy skin lower legs EYES: No eye pain, dryness or itching  No change in vision, recent eye exam in [place] EARS: No earache, tinnitus or change in hearing(uses hearing aids) NOSE: No congestion, drainage or bleeding MOUTH/THROAT: No mouth or tooth pain  No sore throat No difficulty chewing or swallowing RESPIRATORY: No cough, wheezing, SOB CARDIAC: No chest pain, palpitations  No edema. GI: No abdominal pain  No N/V/ Occasional Diarrhea, No constipation  No heartburn or reflux  GU: No dysuria, frequency or urgency  No change in urine volume or character No nocturia or change in stream   MUSCULOSKELETAL: No joint pain, swelling Mild stiffness finger joints  No back pain  Left calf ache(cramp) No muscle weakness  Gait is steady  No recent falls.  NEUROLOGIC: No dizziness, fainting, headacheNo change in mental status.  PSYCHIATRIC: No feelings of anxiety, depression Sleeps well.      Physical Exam Filed Vitals:   08/07/13 2019  BP: 136/80  Pulse: 68  Height: 5\' 7"  (1.702 m)  Weight: 257 lb (116.574 kg)  SpO2: 96%   Body mass index is 40.24 kg/(m^2).  GENERAL APPEARANCE: No acute distress, appropriately groomed, Morbidly obese body habitus. Alert, pleasant, conversant. SKIN: No diaphoresis, rash, unusual lesions, wounds. Skin on lower legs as very thick, dry HEAD: Normocephalic, atraumatic EYES:  Conjunctiva/lids clear. Pupils round, reactive.  EARS: External exam WNL   Hearing grossly normal. NOSE: No deformity or discharge. MOUTH/THROAT: Lips w/o lesions. Oral mucosa, tongue moist, w/o lesion. Oropharynx w/o redness or lesions.  NECK: Supple, full ROM. No thyroid tenderness, enlargement or nodule LYMPHATICS: No head, neck or supraclavicular adenopathy RESPIRATORY: Breathing is even, unlabored. Lung sounds are clear and full.  CARDIOVASCULAR: Heart RRR. No murmur or extra heart sounds  ARTERIAL: No carotid bruit.   VENOUS: Venous stasis skin changes present. Mild tenderness left posterior-lateral calf  EDEMA:  Trace, non pitting bilateral LE edema.  MUSCULOSKELETAL: Moves all extremities with full ROM, strength and tone. Back is without kyphosis, scoliosis or spinal process tenderness. Gait is steady NEUROLOGIC: Oriented to time, place, person. Cranial nerves 2-12 grossly intact, speech clear, no tremor. Patella, brachial DTR 2+. PSYCHIATRIC: Mood and affect appropriate to situation  ASSESSMENT/PLAN  Unspecified essential hypertension Blood pressure well controlled with current medication. Lab updated while he was in [place], will request copy of results  Rheumatoid arthritis New diagnosis in the last month or so. Patient is completing a prednisone taper, is also taking low-dose Plaquenil. He reports no joint pain today. Will follow lab at intervals  Morbid obesity Approximately 5 pound weight loss since last visit here, patient has not changed any dietary or exercise habits  Herpes simplex with unspecified ophthalmic complication The patient returned to ophthalmologist recently. Was told he is to take the acyclovir as a prophylactic measure to prevent reemergence of viral symptoms.  Other and unspecified hyperlipidemia Patient had recent lab studies while in [place], have requested   Follow up: 4 months, Dr. Annamary Carolin T.Sholanda Croson, NP-C 08/07/2013

## 2013-08-07 NOTE — Assessment & Plan Note (Signed)
New diagnosis in the last month or so. Patient is completing a prednisone taper, is also taking low-dose Plaquenil. He reports no joint pain today. Will follow lab at intervals

## 2013-08-07 NOTE — Assessment & Plan Note (Signed)
The patient returned to ophthalmologist recently. Was told he is to take the acyclovir as a prophylactic measure to prevent reemergence of viral symptoms.

## 2013-08-07 NOTE — Assessment & Plan Note (Signed)
Blood pressure well controlled with current medication. Lab updated while he was in [place], will request copy of results

## 2013-08-07 NOTE — Assessment & Plan Note (Signed)
Patient had recent lab studies while in [place], have requested

## 2013-08-07 NOTE — Assessment & Plan Note (Signed)
Approximately 5 pound weight loss since last visit here, patient has not changed any dietary or exercise habits

## 2013-08-08 ENCOUNTER — Encounter: Payer: Self-pay | Admitting: Geriatric Medicine

## 2013-08-10 ENCOUNTER — Encounter: Payer: Self-pay | Admitting: Geriatric Medicine

## 2013-09-06 DIAGNOSIS — L259 Unspecified contact dermatitis, unspecified cause: Secondary | ICD-10-CM | POA: Diagnosis not present

## 2013-09-06 DIAGNOSIS — I872 Venous insufficiency (chronic) (peripheral): Secondary | ICD-10-CM | POA: Diagnosis not present

## 2013-10-06 ENCOUNTER — Non-Acute Institutional Stay (SKILLED_NURSING_FACILITY): Payer: Medicare Other | Admitting: Geriatric Medicine

## 2013-10-06 ENCOUNTER — Encounter: Payer: Self-pay | Admitting: Geriatric Medicine

## 2013-10-06 ENCOUNTER — Telehealth: Payer: Self-pay

## 2013-10-06 DIAGNOSIS — L309 Dermatitis, unspecified: Secondary | ICD-10-CM

## 2013-10-06 DIAGNOSIS — R0602 Shortness of breath: Secondary | ICD-10-CM | POA: Diagnosis not present

## 2013-10-06 DIAGNOSIS — L259 Unspecified contact dermatitis, unspecified cause: Secondary | ICD-10-CM | POA: Diagnosis not present

## 2013-10-06 DIAGNOSIS — R0902 Hypoxemia: Secondary | ICD-10-CM

## 2013-10-06 NOTE — Progress Notes (Signed)
Patient ID: Kevin Kane, male   DOB: May 18, 1930, 80 y.o.   MRN: [id number]  [facility] [phone])  Code Status:Full Code       Contact Information   Name [place] Spouse [phone]  [place]  Patient presents with  . Hypoxemia    HPI: This is a 80 y.o. male resident of [facility],  Independent Living  section.  He was admitted to the rehabilitation section this afternoon due to coughing, shortness of breath and low O2 saturation(84%). He was placed on supplemental oxygen, O2 saturation improved to 92%. By the time he arrived in the rehabilitation section oxygen level was 98% on room air. Chest x-ray was ordered result below. Patient and spouse recount some history regarding this man's respiratory issues. He has been using budesonide nebulizer treatment and albuterol MDI intermittently since 2011. This medication was prescribed by a pulmonologist in [place]. Patient has also had at least 2 ED visit, 1 each in [place] and [place], in the last year due to episodes of shortness of breath and hypoxia. Both of these episodes resolved with minimal interventions.   Allergies  Allergen Reactions  . Ketoconazole       Medication List       This list is accurate as of: 10/06/13  3:51 PM.  Always use your most recent med list.               acyclovir 400 MG tablet  Commonly known as:  ZOVIRAX  Take 400 mg by mouth. Take one tablet daily for virus in eyes     albuterol 108 (90 BASE) MCG/ACT inhaler  Commonly known as:  PROVENTIL HFA;VENTOLIN HFA  Inhale 2 puffs into the lungs every 6 (six) hours as needed for wheezing or shortness of breath.  Start taking on:  10/07/2013     amoxicillin 875 MG tablet  Commonly known as:  AMOXIL  Take 875 mg by mouth 2 (two) times daily.     aspirin 81 MG tablet  Take 81 mg by mouth daily.     atorvastatin 80 MG tablet  Commonly known  as:  LIPITOR  Take 80 mg by mouth. Take one daily for cholesterol     budesonide 0.25 MG/2ML nebulizer solution  Commonly known as:  PULMICORT  Take 0.25 mg by nebulization daily.  Start taking on:  10/07/2013     hydroxychloroquine 200 MG tablet  Commonly known as:  PLAQUENIL  Take 200 mg by mouth daily.     levothyroxine 25 MCG tablet  Commonly known as:  SYNTHROID, LEVOTHROID  Take 25 mcg by mouth daily before breakfast.     metoprolol succinate 50 MG 24 hr tablet  Commonly known as:  TOPROL-XL  Take 50 mg by mouth. Take one tablet daily for blood pressure     multivitamin tablet  Take 1 tablet by mouth daily.     nitroGLYCERIN 0.4 MG SL tablet  Commonly known as:  NITROSTAT  Place 0.4 mg under the tongue every 5 (five) minutes as needed for chest pain.        DATA REVIEWED  Radiologic Exams  [facility] X-ray 10/06/2013 Chest x-ray: No cardiomegaly. Mild pulmonary vascular congestion. No pleural effusion. Patchy atelectasis or interstitial pneumonitis right lung base   Cardiovascular Exams:   Laboratory Studies  05/30/2012 Dr. Macie Burows office labs  WBC 9.7, hemoglobin 12.9, hematocrit 40.2, platelet  293  B12 425.3  Iron 75  Sodium 145, potassium 4.3, chloride 109, glucose 107, BUN 26, creatinine 1.1  Lab Results- [facility]  Component Value Date   CHOL 124 10/06/2012   TRIG 69 10/06/2012   HDL 48 10/06/2012   LDLCALC 62 10/06/2012        Review of Systems  DATA OBTAINED: from patient GENERAL: Feels "OK now"   No fevers, fatigue. Recent decreased appetite, mild weight loss SKIN: No rash or open wounds. Dry itchy skin lower legs EYES: No eye pain, dryness or itching  No change in vision EARS: No earache, tinnitus or change in hearing(uses hearing aids) NOSE: No congestion, drainage or bleeding MOUTH/THROAT: No mouth or tooth pain   No sore throat   has difficulty chewing, lower bridge is uncomfortable  RESPIRATORY: Cough, SOB earlier today    CARDIAC: No chest pain, palpitations  No edema. GI: No abdominal pain  No N/V/ Occasional Diarrhea, No constipation  No heartburn or reflux  GU: No dysuria, frequency or urgency  No change in urine volume or character No nocturia or change in stream   MUSCULOSKELETAL: No joint pain, swelling Mild stiffness finger joints, mild lower back pain he attributes these both to RA   No muscle weakness  Gait is steady  No recent falls.  NEUROLOGIC: No dizziness, fainting, headache    No change in mental status.  PSYCHIATRIC: No feelings of anxiety, depression Sleeps well.      PHYSICAL EXAM  Filed Vitals:   10/06/13 1417  BP: 137/70  Pulse: 80  Temp: 99.2 F (37.3 C)  Resp: 20  Weight: 257 lb 12.8 oz (116.937 kg)  SpO2: 98%   Body mass index is 40.37 kg/(m^2).  GENERAL APPEARANCE: No acute distress, appropriately groomed, Morbidly obese body habitus. Alert, pleasant, conversant. SKIN: No diaphoresis, rash, unusual lesions, wounds. Skin on lower legs as very thick, dry HEAD: Normocephalic, atraumatic EYES: Conjunctiva/lids clear. Pupils round, reactive.  EARS: External exam WNL   Hearing grossly normal. NOSE: No deformity or discharge. MOUTH/THROAT: Lips w/o lesions. Oral mucosa, tongue moist, w/o lesion. Oropharynx w/o redness or lesions.  NECK: Supple, full ROM. No thyroid tenderness, enlargement or nodule LYMPHATICS: No head, neck or supraclavicular adenopathy RESPIRATORY: Breathing is even, unlabored. Lung sounds are clear and full.  CARDIOVASCULAR: Heart RRR. No murmur or extra heart sounds  ARTERIAL: No carotid bruit.   VENOUS: Venous stasis skin changes present. Mild tenderness left posterior-lateral calf  EDEMA: Trace, non pitting bilateral LE edema.  MUSCULOSKELETAL: Moves all extremities with full ROM, strength and tone. Back is without kyphosis, scoliosis or spinal process tenderness. Gait is steady NEUROLOGIC: Oriented to time, place, person. Cranial nerves 2-12 grossly intact,  speech clear, no tremor. Patella, brachial DTR 2+. PSYCHIATRIC: Mood and affect appropriate to situation  ASSESSMENT/PLAN  Hypoxia SOB/ hypoxia, recurrent episodes, may be asthma aggravated by URI, obesity , poor general conditioning are other factors. Has used budesonide neb tx intermittently, recommend daily use to maintain respiratory status, use albuterol MDI as rescue inhaler. Recommend PFTs- pt. Agrees.  Recommend he spend the night in Rehab, if he remains stable [place]/c to [place] home tomorrow.  Morbid obesity Unchanged, no intentional weight loss. He has depressed appetite recently, possibly due to poor fitting dental appliance.   Eczema Patient reports he has seen a new dermatologist recently, Rx a different cream which has been very helpful. He does not have the cream with him today, cannot recall the name   Follow up:  AS scheduled in [place] clinic  PFTs to be arranged  Evania Lyne T.Hobie Kohles, NP-C 10/06/2013

## 2013-10-06 NOTE — Telephone Encounter (Signed)
[facility] called, Kevin Kane wants appt made at [facility] for Pulmonary Function test and appt with her next month. Appt at [facility] Tues 10/10/13 at 3:00 at [address]. Appt with Kevin Kane 11/15/13 at 9:30. Called [name] back at [facility] and gave her the date and time.

## 2013-10-09 ENCOUNTER — Encounter: Payer: Self-pay | Admitting: Geriatric Medicine

## 2013-10-09 ENCOUNTER — Other Ambulatory Visit: Payer: Self-pay | Admitting: Geriatric Medicine

## 2013-10-09 DIAGNOSIS — R0602 Shortness of breath: Secondary | ICD-10-CM

## 2013-10-09 DIAGNOSIS — R0902 Hypoxemia: Secondary | ICD-10-CM | POA: Insufficient documentation

## 2013-10-09 NOTE — Assessment & Plan Note (Signed)
Patient reports he has seen a new dermatologist recently, Rx a different cream which has been very helpful. He does not have the cream with him today, cannot recall the name

## 2013-10-09 NOTE — Assessment & Plan Note (Signed)
Unchanged, no intentional weight loss. He has depressed appetite recently, possibly due to poor fitting dental appliance.

## 2013-10-09 NOTE — Assessment & Plan Note (Signed)
SOB/ hypoxia, recurrent episodes, may be asthma aggravated by URI, obesity , poor general conditioning are other factors. Has used budesonide neb tx intermittently, recommend daily use to maintain respiratory status, use albuterol MDI as rescue inhaler. Recommend PFTs- pt. Agrees.  Recommend he spend the night in Rehab, if he remains stable [place] tomorrow.

## 2013-10-10 ENCOUNTER — Ambulatory Visit (INDEPENDENT_AMBULATORY_CARE_PROVIDER_SITE_OTHER): Payer: Medicare Other | Admitting: Internal Medicine

## 2013-10-10 DIAGNOSIS — R0602 Shortness of breath: Secondary | ICD-10-CM

## 2013-10-10 LAB — PULMONARY FUNCTION TEST
DL/VA % pred: 56 %
DL/VA: 2.53 ml/min/mmHg/L
DLCO unc % pred: 38 %
DLCO unc: 11.48 ml/min/mmHg
FEF 25-75 Post: 1.05 L/sec
FEF 25-75 Pre: 0.75 L/sec
FEF2575-%Change-Post: 40 %
FEF2575-%Pred-Post: 60 %
FEF2575-%Pred-Pre: 43 %
FEV1-%Change-Post: 8 %
FEV1-%Pred-Post: 59 %
FEV1-%Pred-Pre: 54 %
FEV1-POST: 1.52 L
FEV1-Pre: 1.4 L
FEV1FVC-%CHANGE-POST: 0 %
FEV1FVC-%Pred-Pre: 94 %
FEV6-%CHANGE-POST: 9 %
FEV6-%PRED-PRE: 61 %
FEV6-%Pred-Post: 67 %
FEV6-PRE: 2.08 L
FEV6-Post: 2.28 L
FEV6FVC-%Pred-Post: 107 %
FEV6FVC-%Pred-Pre: 107 %
FVC-%Change-Post: 9 %
FVC-%PRED-POST: 62 %
FVC-%PRED-PRE: 57 %
FVC-PRE: 2.08 L
FVC-Post: 2.28 L
POST FEV6/FVC RATIO: 100 %
Post FEV1/FVC ratio: 67 %
Pre FEV1/FVC ratio: 67 %
Pre FEV6/FVC Ratio: 100 %
RV % PRED: 88 %
RV: 2.27 L
TLC % pred: 69 %
TLC: 4.66 L

## 2013-10-10 NOTE — Progress Notes (Signed)
PFT done today. Katie Welchel,CMA  

## 2013-10-17 ENCOUNTER — Telehealth: Payer: Self-pay

## 2013-10-17 NOTE — Telephone Encounter (Signed)
Patient called asking for Pulmonary Function test results done on 10/10/13. Results are here, will ask Claudette what she recommends.

## 2013-10-19 NOTE — Telephone Encounter (Signed)
Spoke with [name] about his Pulmonary test per Claudette probable Asthma, continue same medication. Keep appt in Feb with Claudette.

## 2013-10-19 NOTE — Telephone Encounter (Signed)
Thank you :)

## 2013-11-15 ENCOUNTER — Non-Acute Institutional Stay: Payer: Medicare Other | Admitting: Geriatric Medicine

## 2013-11-15 ENCOUNTER — Encounter: Payer: Self-pay | Admitting: Geriatric Medicine

## 2013-11-15 VITALS — BP 124/62 | HR 64 | Ht 67.0 in | Wt 249.0 lb

## 2013-11-15 DIAGNOSIS — J449 Chronic obstructive pulmonary disease, unspecified: Secondary | ICD-10-CM

## 2013-11-15 MED ORDER — ALBUTEROL SULFATE HFA 108 (90 BASE) MCG/ACT IN AERS: 2.0000 | INHALATION_SPRAY | Freq: Four times a day (QID) | RESPIRATORY_TRACT | Status: DC | PRN

## 2013-11-15 NOTE — Assessment & Plan Note (Signed)
8lb weight loss since last visit due to limited diet re: dental issues. He is undergoing "1 1/2 year project" including dental implants. Not able to chew well at this time.  Weight loss is desirable though we discussed importance of maintaining adequate nutrition. Since he is unable to eat balanced meals due to his dental issues have recommended 1-2 servings of a nutritional supplement (boost or Ensure) daily to be sure he is maintaining adequate protein intake.  Discussed again that significant and permanent weight loss his best achieved by a combination of diet management and exercise.

## 2013-11-15 NOTE — Progress Notes (Signed)
Patient ID: Kevin Kane, male   DOB: 04-Aug-1930, 80 y.o.   MRN: [id number]  [facility] [phone])  Code Status:Full Code       Contact Information   Name Relation Home Work [place] Spouse [phone]  [phone]      Chief Complaint  Patient presents with  . Medical Managment of Chronic Issues    shortness of breath    HPI: This is a 80 y.o. male resident of [place], Independent [facility]  section.  He returns to clinic today in follow up of short Rehab stay due to SOB/ hypoxia.  Last visit: Hypoxia SOB/ hypoxia, recurrent episodes, may be asthma aggravated by URI, obesity , poor general conditioning are other factors. Has used budesonide neb tx intermittently, recommend daily use to maintain respiratory status, use albuterol MDI as rescue inhaler. Recommend PFTs- pt. Agrees.  Recommend he spend the night in Rehab, if he remains stable d/c to [place] home tomorrow.  Morbid obesity Unchanged, no intentional weight loss. He has depressed appetite recently, possibly due to poor fitting dental appliance.   Eczema Patient reports he has seen a new dermatologist recently, Rx a different cream which has been very helpful. He does not have the cream with him today, cannot recall the name  Since last visit patient underwent pulmonary function testing; results consistent with asthma. He was instructed to use budesonide daily and albuterol prn. Patient reports he has not been using the desonide very often, "I've been feeling pretty well".  Expresses concern about weight loss; he has less appetite, is not able to chew very well; "I'm eating less bad stuff" due to extended "dental project". This has included teeth extractions and will eventually include dental implants.  Patient reports his eczema has been under better control, is using clobetasol cream p.r.n.   Allergies  Allergen Reactions  . Ketoconazole       Medication List       This list is accurate as of: 11/15/13 10:21 AM.  Always use your most recent med list.               acyclovir 400 MG tablet  Commonly known as:  ZOVIRAX  Take 400 mg by mouth. Take one tablet daily for virus in eyes     albuterol 108 (90 BASE) MCG/ACT inhaler  Commonly known as:  PROAIR HFA  Inhale 2 puffs into the lungs every 6 (six) hours as needed for wheezing or shortness of breath.     aspirin 81 MG tablet  Take 81 mg by mouth daily.     atorvastatin 80 MG tablet  Commonly known as:  LIPITOR  Take 80 mg by mouth. Take one daily for cholesterol     budesonide 0.25 MG/2ML nebulizer solution  Commonly known as:  PULMICORT  Take 0.25 mg by nebulization daily.     clobetasol cream 0.05 %  Commonly known as:  TEMOVATE  Apply 1 application topically. As needed for eczema     hydroxychloroquine 200 MG tablet  Commonly known as:  PLAQUENIL  Take 200 mg by mouth daily.     levothyroxine 25 MCG tablet  Commonly known as:  SYNTHROID, LEVOTHROID  Take 25 mcg by mouth daily before breakfast.     metoprolol succinate 50 MG 24 hr tablet  Commonly known as:  TOPROL-XL  Take 50 mg by mouth. Take one tablet daily for blood pressure     multivitamin tablet  Take 1 tablet  by mouth daily.     nitroGLYCERIN 0.4 MG SL tablet  Commonly known as:  NITROSTAT  Place 0.4 mg under the tongue every 5 (five) minutes as needed for chest pain.        DATA REVIEWED  Radiologic Exams  [facility] 10/06/2013 Chest x-ray: No cardiomegaly. Mild pulmonary vascular congestion. No pleural effusion. Patchy atelectasis or interstitial pneumonitis right lung base   Cardiovascular Exams:   Laboratory Studies  05/30/2012 Dr. Macie Burows office labs  WBC 9.7, hemoglobin 12.9, hematocrit 40.2, platelet 293  B12 425.3  Iron 75  Sodium 145, potassium 4.3, chloride 109, glucose 107, BUN 26, creatinine 1.1  Pulmonary function test   [facility] 10/10/2013: Evidence of  moderate obstructive lung disease with small airway involvement (asthma); FEV1 54% predicted, FEV1/FVC <70%, FEF25-75% is reduced. Mild response to bronchodilator; FEV1 w/ 8% change after bronchodilator.     Lab Results- [facility]  Component Value Date   CHOL 124 10/06/2012   TRIG 69 10/06/2012   HDL 48 10/06/2012   LDLCALC 62 10/06/2012        Review of Systems  DATA OBTAINED: from patient GENERAL: Feels "OK now"   No fevers, fatigue. Recent decreased appetite, mild weight loss- see HPI SKIN: No rash or open wounds.  skin lower legs less dry/itchy EYES: No eye pain, dryness or itching  No change in vision EARS: No earache, tinnitus or change in hearing(uses hearing aids) NOSE: No congestion, drainage or bleeding MOUTH/THROAT: No mouth or tooth pain   No sore throat   has difficulty chewing,multiple missing teeth  RESPIRATORY: No Cough, wheezing. Feels SOB with activity CARDIAC: No chest pain, palpitations  No edema. NEUROLOGIC: No dizziness, fainting, headache    No change in mental status.  PSYCHIATRIC: No feelings of anxiety, depression Sleeps well.     PHYSICAL EXAM  Filed Vitals:   11/15/13 0937  BP: 124/62  Pulse: 64  Height: 5\' 7"  (1.702 m)  Weight: 249 lb (112.946 kg)  SpO2: 96%   Body mass index is 38.99 kg/(m^2).  GENERAL APPEARANCE: No acute distress, appropriately groomed, Morbidly obese body habitus. Alert, pleasant, conversant. SKIN: No diaphoresis, rash, unusual lesions, wounds. Skin on lower legs improved HEAD: Normocephalic, atraumatic EYES: Conjunctiva/lids clear. Pupils round, reactive.  EARS: External exam WNL   Hearing grossly normal. NOSE: No deformity or discharge. MOUTH/THROAT: Lips w/o lesions. Oral mucosa, tongue moist, w/o lesion. Oropharynx w/o redness or lesions.  NECK: Supple, full ROM. No thyroid tenderness, enlargement or nodule LYMPHATICS: No head, neck or supraclavicular adenopathy RESPIRATORY: Breathing is even, unlabored. Lung sounds are  clear, distant (due to body habitus).  CARDIOVASCULAR: Heart RRR. No murmur or extra heart sounds  EDEMA: Trace, non pitting bilateral LE edema.  MUSCULOSKELETAL:  Gait is steady NEUROLOGIC: Oriented to time, place, person. Speech clear, no tremor. Marland Kitchen PSYCHIATRIC: Mood and affect appropriate to situation  ASSESSMENT/PLAN  Chronic obstructive asthma, unspecified Reviewed asthma diagnosis as a chronic inflammatory problem best treated consistently since he has multiple exacerbations of SOB/hypoxemia in the last year Encouraged patient to use budesonide daily to prevent exacerbation of asthma. Use albuterol as rescue inhaler  Morbid obesity 8lb weight loss since last visit due to limited diet re: dental issues. He is undergoing "1 1/2 year project" including dental implants. Not able to chew well at this time.  Weight loss is desirable though we discussed importance of maintaining adequate nutrition. Since he is unable to eat balanced meals due to his dental issues  have recommended 1-2 servings of a nutritional supplement (boost or Ensure) daily to be sure he is maintaining adequate protein intake.  Discussed again that significant and permanent weight loss his best achieved by a combination of diet management and exercise.    Follow up: As scheduled w/ Dr.Green  Mardene Celeste, NP-C [place] [phone]   11/15/2013

## 2013-11-15 NOTE — Assessment & Plan Note (Addendum)
Reviewed asthma diagnosis as a chronic inflammatory problem best treated consistently since he has multiple exacerbations of SOB/hypoxemia in the last year Encouraged patient to use budesonide daily to prevent exacerbation of asthma. Use albuterol as rescue inhaler

## 2013-12-04 ENCOUNTER — Non-Acute Institutional Stay: Payer: Medicare Other | Admitting: Internal Medicine

## 2013-12-04 ENCOUNTER — Encounter: Payer: Self-pay | Admitting: Internal Medicine

## 2013-12-04 VITALS — BP 120/70 | HR 72 | Ht 67.0 in | Wt 247.0 lb

## 2013-12-04 DIAGNOSIS — J449 Chronic obstructive pulmonary disease, unspecified: Secondary | ICD-10-CM

## 2013-12-04 DIAGNOSIS — M069 Rheumatoid arthritis, unspecified: Secondary | ICD-10-CM | POA: Diagnosis not present

## 2013-12-04 DIAGNOSIS — I8310 Varicose veins of unspecified lower extremity with inflammation: Secondary | ICD-10-CM

## 2013-12-04 DIAGNOSIS — L309 Dermatitis, unspecified: Secondary | ICD-10-CM

## 2013-12-04 DIAGNOSIS — M545 Low back pain, unspecified: Secondary | ICD-10-CM

## 2013-12-04 DIAGNOSIS — E039 Hypothyroidism, unspecified: Secondary | ICD-10-CM

## 2013-12-04 DIAGNOSIS — I10 Essential (primary) hypertension: Secondary | ICD-10-CM

## 2013-12-04 DIAGNOSIS — L259 Unspecified contact dermatitis, unspecified cause: Secondary | ICD-10-CM

## 2013-12-04 DIAGNOSIS — I872 Venous insufficiency (chronic) (peripheral): Secondary | ICD-10-CM

## 2013-12-04 DIAGNOSIS — E785 Hyperlipidemia, unspecified: Secondary | ICD-10-CM | POA: Diagnosis not present

## 2013-12-04 DIAGNOSIS — R609 Edema, unspecified: Secondary | ICD-10-CM

## 2013-12-04 DIAGNOSIS — R269 Unspecified abnormalities of gait and mobility: Secondary | ICD-10-CM

## 2013-12-04 NOTE — Progress Notes (Signed)
Patient ID: Kevin Kane, male   DOB: August 11, 1930, 80 y.o.   MRN: [id number]    Location:  [facility] (12)    Allergies  Allergen Reactions  . Ketoconazole     Chief Complaint  Patient presents with  . Medical Managment of Chronic Issues    blood pressure, asthma, edema    HPI:   Unspecified essential hypertension: Controlled  Rheumatoid arthritis: Using Plaquenil. Gen. good control of his pain.  Eczema: Chronic changes in the lower legs. Seems to respond to steroid creams.  Other and unspecified hyperlipidemia. : Needs followup  Edema: Worsened when examined last year.  Gait disorder: Patient lives that his gait disturbance is getting worse. He is using any. He feels unbalanced when walking  Morbid obesity: Patient has lost 17 pounds in the last 14 months. He has been undergoing some dental procedures and probably cause him to lose weight. He has not altered his diet significantly otherwise.  Chronic obstructive asthma, unspecified: Patient has several different inhalers. He wondered if there is any overlap and if he can stop these medicines. He is doing well his breathing at the present time.  Lumbago: Chronic low back discomfort.  Venous stasis dermatitis: Patient is not applying his moisturizing agent on a regular basis.    Medications: Patient's Medications  New Prescriptions   No medications on file  Previous Medications   ACYCLOVIR (ZOVIRAX) 400 MG TABLET    Take 400 mg by mouth. Take one tablet daily for virus in eyes   ALBUTEROL (PROAIR HFA) 108 (90 BASE) MCG/ACT INHALER    Inhale 2 puffs into the lungs every 6 (six) hours as needed for wheezing or shortness of breath.   ASPIRIN 81 MG TABLET    Take 81 mg by mouth daily.   ATORVASTATIN (LIPITOR) 80 MG TABLET    Take 80 mg by mouth. Take one daily for cholesterol   BUDESONIDE (PULMICORT) 0.25 MG/2ML NEBULIZER SOLUTION    Take 0.25 mg by nebulization daily.   CLOBETASOL CREAM (TEMOVATE) 0.05 %    Apply 1 application topically. As needed for eczema   FEXOFENADINE (ALLEGRA) 180 MG TABLET    Take 180 mg by mouth daily. Take one tablet daily for allergies   HYDROXYCHLOROQUINE (PLAQUENIL) 200 MG TABLET    Take 200 mg by mouth daily.   IBUPROFEN (ADVIL) 200 MG CAPS    Take by mouth. Take one as needed for pain   LEVOTHYROXINE (SYNTHROID, LEVOTHROID) 25 MCG TABLET    Take 25 mcg by mouth daily before breakfast.   METOPROLOL SUCCINATE (TOPROL-XL) 50 MG 24 HR TABLET    Take 50 mg by mouth. Take one tablet daily for blood pressure   MULTIPLE VITAMIN (MULTIVITAMIN) TABLET    Take 1 tablet by mouth daily.   NITROGLYCERIN (NITROSTAT) 0.4 MG SL TABLET    Place 0.4 mg under the tongue every 5 (five) minutes as needed for chest pain.  Modified Medications   No medications on file  Discontinued Medications   No medications on file     Review of Systems  Constitutional: Positive for unexpected weight change (17# in last 14 Months. Possibly related to dental work).  Eyes: Negative.   Respiratory: Positive for shortness of breath and wheezing.   Cardiovascular: Positive for leg swelling. Negative for chest pain and palpitations.  Gastrointestinal:       Occasional nausea.  Endocrine:       History of elevated TSH.  Genitourinary: Positive for urgency.  Musculoskeletal: Positive for arthralgias, back pain and gait problem (walks wobbly and unsteady ).       One leg shorter than the other. Limps from prior hip surgery. Pain in the lumbar area after walking short distances.  Skin:       Chronic venous stasis changes in both legs. Pink inflammation of both lower legs.  Neurological: Positive for dizziness. Negative for tremors, seizures, syncope, speech difficulty, weakness, light-headedness, numbness and headaches.  Hematological: Negative for adenopathy. Bruises/bleeds easily.  Psychiatric/Behavioral: Negative for suicidal ideas, behavioral problems, confusion,  sleep disturbance, self-injury, decreased concentration and agitation. The patient is not nervous/anxious and is not hyperactive.     Filed Vitals:   12/04/13 1627  BP: 120/70  Pulse: 72  Height: 5\' 7"  (1.702 m)  Weight: 247 lb (112.038 kg)  SpO2: 93%   Physical Exam  Constitutional:  Obese  HENT:  Right Ear: External ear normal.  Left Ear: External ear normal.  Nose: Nose normal.  Mouth/Throat: Oropharynx is clear and moist. No oropharyngeal exudate.  Dentures and partial plate. Some teeth are missing.  Eyes: Conjunctivae and EOM are normal. Pupils are equal, round, and reactive to light.  Neck: No JVD present. No tracheal deviation present. No thyromegaly present.  Cardiovascular: Normal rate, regular rhythm, normal heart sounds and intact distal pulses.  Exam reveals no gallop and no friction rub.   No murmur heard. Pulmonary/Chest: No respiratory distress. He has no wheezes. He has no rales. He exhibits no tenderness.  Abdominal: He exhibits no distension and no mass. There is no tenderness.  Musculoskeletal: He exhibits edema. He exhibits no tenderness.  Lymphadenopathy:    He has no cervical adenopathy.  Skin:  Dry skin in general. Lower legs have a pink color suggesting chronic inflammation.  Psychiatric: He has a normal mood and affect. His behavior is normal. Judgment and thought content normal.     Labs reviewed: 05/30/2012  Dr. Macie Burows office labs CBC: Wbc 9.7, Rbc 4.46, Hgb 12.9, Hct 40.2, Platelet 293 Vitamin B12  425.3 Iron 75, UIBC 308, Saturation 19, IRN Binding 383 CMP: Sodium 145, Potassium 4.3, CL 109, glucose 107, BUN 26, Creatinine 1.1 Total Protein 5.8 10/06/2012 Lipid: cholesterol 124, triglyceride 69, HDL 48, LDL 62   Clinical Support on 10/10/2013  Component Date Value Ref Range Status  . FVC-Pre 10/10/2013 2.08   Final  . FVC-%Pred-Pre 10/10/2013 57   Final  . FVC-Post 10/10/2013 2.28   Final  . FVC-%Pred-Post 10/10/2013 62   Final    . FVC-%Change-Post 10/10/2013 9   Final  . FEV1-Pre 10/10/2013 1.40   Final  . FEV1-%Pred-Pre 10/10/2013 54   Final  . FEV1-Post 10/10/2013 1.52   Final  . FEV1-%Pred-Post 10/10/2013 59   Final  . FEV1-%Change-Post 10/10/2013 8   Final  . FEV6-Pre 10/10/2013 2.08   Final  . FEV6-%Pred-Pre 10/10/2013 61   Final  . FEV6-Post 10/10/2013 2.28   Final  . FEV6-%Pred-Post 10/10/2013 67   Final  . FEV6-%Change-Post 10/10/2013 9   Final  . Pre FEV1/FVC ratio 10/10/2013 67   Final  . FEV1FVC-%Pred-Pre 10/10/2013 94   Final  . Post FEV1/FVC ratio 10/10/2013 67   Final  . FEV1FVC-%Change-Post 10/10/2013 0   Final  . Pre FEV6/FVC Ratio 10/10/2013 100   Final  . FEV6FVC-%Pred-Pre 10/10/2013 107   Final  . Post FEV6/FVC ratio 10/10/2013 100   Final  . FEV6FVC-%Pred-Post 10/10/2013 107   Final  .  FEF 25-75 Pre 10/10/2013 0.75   Final  . FEF2575-%Pred-Pre 10/10/2013 43   Final  . FEF 25-75 Post 10/10/2013 1.05   Final  . FEF2575-%Pred-Post 10/10/2013 60   Final  . FEF2575-%Change-Post 10/10/2013 40   Final  . RV 10/10/2013 2.27   Final  . RV % pred 10/10/2013 88   Final  . TLC 10/10/2013 4.66   Final  . TLC % pred 10/10/2013 69   Final  . DLCO unc 10/10/2013 11.48   Final  . DLCO unc % pred 10/10/2013 38   Final  . DL/VA 10/10/2013 2.53   Final  . DL/VA % pred 10/10/2013 56   Final      Assessment/Plan  1. Unspecified essential hypertension Controlled  2. Rheumatoid arthritis Continue Plaquenil. I ordered CBC and CMP.  3. Eczema Continue steroid creams  4. Other and unspecified hyperlipidemia Recheck lab  5. Edema I did not increase diuretics today  6. Gait disorder Discuss further evaluation. The patient is agreeable, consider lumbar spine MRI and brain MRI. Problems with urination and bowels could relate to spinal stenosis or to brain degeneration.  7. Morbid obesity Advise patient that I thought his weight loss was  A positive thing. We will continue to keep an eye on  this.  8. Chronic obstructive asthma, unspecified Patient has not had any acute attacks although he does have occasional wheezing. Medications were discussed in detail. He will stay on all current medications  9. Lumbago Etiology uncertain. He most likely has osteoarthritis and degenerative problems of lower back. I cannot rule out lumbar stenosis or foraminal stenoses.  10. Venous stasis dermatitis Advised patient to apply Vaseline or Aquaphor at least 3 nights per week and to wear long tube socks up to his knees following application of these materials.  11. Hypothyroidism Recheck TSH. Currently on 25 mcg levothyroxine daily.

## 2013-12-07 DIAGNOSIS — I10 Essential (primary) hypertension: Secondary | ICD-10-CM | POA: Diagnosis not present

## 2013-12-07 DIAGNOSIS — I2510 Atherosclerotic heart disease of native coronary artery without angina pectoris: Secondary | ICD-10-CM | POA: Diagnosis not present

## 2013-12-07 DIAGNOSIS — R609 Edema, unspecified: Secondary | ICD-10-CM | POA: Diagnosis not present

## 2013-12-07 LAB — LIPID PANEL
Cholesterol: 158 mg/dL (ref 0–200)
HDL: 50 mg/dL (ref 35–70)
LDL Cholesterol: 91 mg/dL
LDl/HDL Ratio: 3.2
Triglycerides: 71 mg/dL (ref 40–160)

## 2013-12-07 LAB — BASIC METABOLIC PANEL
BUN: 20 mg/dL (ref 4–21)
Creatinine: 1.1 mg/dL (ref 0.6–1.3)
GLUCOSE: 91 mg/dL
POTASSIUM: 4.3 mmol/L (ref 3.4–5.3)
Sodium: 145 mmol/L (ref 137–147)

## 2013-12-07 LAB — HEPATIC FUNCTION PANEL
ALK PHOS: 82 U/L (ref 25–125)
ALT: 12 U/L (ref 10–40)
AST: 17 U/L (ref 14–40)
Bilirubin, Total: 0.4 mg/dL

## 2013-12-07 LAB — TSH: TSH: 3.98 u[IU]/mL (ref 0.41–5.90)

## 2014-02-01 DIAGNOSIS — B0052 Herpesviral keratitis: Secondary | ICD-10-CM | POA: Diagnosis not present

## 2014-02-01 DIAGNOSIS — H35369 Drusen (degenerative) of macula, unspecified eye: Secondary | ICD-10-CM | POA: Diagnosis not present

## 2014-02-14 DIAGNOSIS — B0052 Herpesviral keratitis: Secondary | ICD-10-CM | POA: Diagnosis not present

## 2014-02-14 DIAGNOSIS — H11149 Conjunctival xerosis, unspecified, unspecified eye: Secondary | ICD-10-CM | POA: Diagnosis not present

## 2014-03-05 ENCOUNTER — Encounter: Payer: Self-pay | Admitting: Internal Medicine

## 2014-04-04 DIAGNOSIS — I6529 Occlusion and stenosis of unspecified carotid artery: Secondary | ICD-10-CM | POA: Diagnosis not present

## 2014-04-04 DIAGNOSIS — I779 Disorder of arteries and arterioles, unspecified: Secondary | ICD-10-CM | POA: Diagnosis not present

## 2014-04-10 DIAGNOSIS — E7800 Pure hypercholesterolemia, unspecified: Secondary | ICD-10-CM | POA: Diagnosis not present

## 2014-04-10 DIAGNOSIS — M069 Rheumatoid arthritis, unspecified: Secondary | ICD-10-CM | POA: Diagnosis not present

## 2014-04-10 DIAGNOSIS — Z79899 Other long term (current) drug therapy: Secondary | ICD-10-CM | POA: Diagnosis not present

## 2014-04-16 DIAGNOSIS — E7800 Pure hypercholesterolemia, unspecified: Secondary | ICD-10-CM | POA: Diagnosis not present

## 2014-04-16 DIAGNOSIS — Z9861 Coronary angioplasty status: Secondary | ICD-10-CM | POA: Diagnosis not present

## 2014-04-16 DIAGNOSIS — I2119 ST elevation (STEMI) myocardial infarction involving other coronary artery of inferior wall: Secondary | ICD-10-CM | POA: Diagnosis not present

## 2014-04-16 DIAGNOSIS — I2510 Atherosclerotic heart disease of native coronary artery without angina pectoris: Secondary | ICD-10-CM | POA: Diagnosis not present

## 2014-04-16 DIAGNOSIS — I6529 Occlusion and stenosis of unspecified carotid artery: Secondary | ICD-10-CM | POA: Diagnosis not present

## 2014-04-18 DIAGNOSIS — I6529 Occlusion and stenosis of unspecified carotid artery: Secondary | ICD-10-CM | POA: Diagnosis not present

## 2014-04-18 DIAGNOSIS — R279 Unspecified lack of coordination: Secondary | ICD-10-CM | POA: Diagnosis not present

## 2014-04-18 DIAGNOSIS — I2119 ST elevation (STEMI) myocardial infarction involving other coronary artery of inferior wall: Secondary | ICD-10-CM | POA: Diagnosis not present

## 2014-04-18 DIAGNOSIS — J45909 Unspecified asthma, uncomplicated: Secondary | ICD-10-CM | POA: Diagnosis not present

## 2014-04-18 DIAGNOSIS — M069 Rheumatoid arthritis, unspecified: Secondary | ICD-10-CM | POA: Diagnosis not present

## 2014-04-18 DIAGNOSIS — E039 Hypothyroidism, unspecified: Secondary | ICD-10-CM | POA: Diagnosis not present

## 2014-05-09 DIAGNOSIS — Q664 Congenital talipes calcaneovalgus, unspecified foot: Secondary | ICD-10-CM | POA: Diagnosis not present

## 2014-05-09 DIAGNOSIS — M2170 Unequal limb length (acquired), unspecified site: Secondary | ICD-10-CM | POA: Diagnosis not present

## 2014-05-29 DIAGNOSIS — B0052 Herpesviral keratitis: Secondary | ICD-10-CM | POA: Diagnosis not present

## 2014-06-22 DIAGNOSIS — N201 Calculus of ureter: Secondary | ICD-10-CM | POA: Diagnosis not present

## 2014-06-22 DIAGNOSIS — R972 Elevated prostate specific antigen [PSA]: Secondary | ICD-10-CM | POA: Diagnosis not present

## 2014-06-22 DIAGNOSIS — R350 Frequency of micturition: Secondary | ICD-10-CM | POA: Diagnosis not present

## 2014-06-22 DIAGNOSIS — N400 Enlarged prostate without lower urinary tract symptoms: Secondary | ICD-10-CM | POA: Diagnosis not present

## 2014-06-22 DIAGNOSIS — Z125 Encounter for screening for malignant neoplasm of prostate: Secondary | ICD-10-CM | POA: Diagnosis not present

## 2014-06-26 DIAGNOSIS — J4540 Moderate persistent asthma, uncomplicated: Secondary | ICD-10-CM | POA: Diagnosis not present

## 2014-06-26 DIAGNOSIS — R0602 Shortness of breath: Secondary | ICD-10-CM | POA: Diagnosis not present

## 2014-07-02 ENCOUNTER — Encounter: Payer: Medicare Other | Admitting: Internal Medicine

## 2014-07-06 DIAGNOSIS — R972 Elevated prostate specific antigen [PSA]: Secondary | ICD-10-CM | POA: Diagnosis not present

## 2014-07-06 DIAGNOSIS — N201 Calculus of ureter: Secondary | ICD-10-CM | POA: Diagnosis not present

## 2014-07-06 DIAGNOSIS — R350 Frequency of micturition: Secondary | ICD-10-CM | POA: Diagnosis not present

## 2014-07-06 DIAGNOSIS — N401 Enlarged prostate with lower urinary tract symptoms: Secondary | ICD-10-CM | POA: Diagnosis not present

## 2014-07-09 DIAGNOSIS — Z23 Encounter for immunization: Secondary | ICD-10-CM | POA: Diagnosis not present

## 2014-11-27 DIAGNOSIS — M2042 Other hammer toe(s) (acquired), left foot: Secondary | ICD-10-CM | POA: Diagnosis not present

## 2014-11-27 DIAGNOSIS — M79671 Pain in right foot: Secondary | ICD-10-CM | POA: Diagnosis not present

## 2014-11-27 DIAGNOSIS — M25571 Pain in right ankle and joints of right foot: Secondary | ICD-10-CM | POA: Diagnosis not present

## 2014-11-27 DIAGNOSIS — M2041 Other hammer toe(s) (acquired), right foot: Secondary | ICD-10-CM | POA: Diagnosis not present

## 2014-11-28 ENCOUNTER — Encounter: Payer: Self-pay | Admitting: Nurse Practitioner

## 2014-11-28 ENCOUNTER — Non-Acute Institutional Stay: Payer: Medicare Other | Admitting: Nurse Practitioner

## 2014-11-28 VITALS — BP 160/78 | HR 64

## 2014-11-28 DIAGNOSIS — Z7189 Other specified counseling: Secondary | ICD-10-CM | POA: Diagnosis not present

## 2014-11-28 DIAGNOSIS — E785 Hyperlipidemia, unspecified: Secondary | ICD-10-CM

## 2014-11-28 DIAGNOSIS — I10 Essential (primary) hypertension: Secondary | ICD-10-CM

## 2014-11-28 DIAGNOSIS — E039 Hypothyroidism, unspecified: Secondary | ICD-10-CM

## 2014-11-28 NOTE — Progress Notes (Signed)
Patient ID: Kevin Kane, male   DOB: 1929/11/09, 81 y.o.   MRN: [id number]    Nursing Home Location:  [place] of Service: Clinic (12)  PCP: Estill Dooms, MD  Allergies  Allergen Reactions  . Ketoconazole     Chief Complaint  Patient presents with  . Advanced Directives    to talk about MOST & DNR forms for him and his wife. Here with wife.  (Does not want wt, BP taken, not here for that)    HPI:  Patient is a 81 y.o. male seen today at [facility] wanting to discuss DNR and MOST form. Given form from someone at [facility] not had routine visit in over a year, has not had blood work as well Review of Systems:  Review of Systems  Constitutional: Negative for activity change and appetite change.  Respiratory: Negative for cough and shortness of breath.   Cardiovascular: Negative for chest pain and palpitations.  Gastrointestinal: Negative for abdominal pain, diarrhea and constipation.  Genitourinary: Negative for difficulty urinating.    Past Medical History  Diagnosis Date  . Osteoarthrosis, unspecified whether generalized or localized, unspecified site 10/10/2012  . Morbid obesity 10/05/2012  . Other specified erythematous condition(695.89) 10/05/2012  . Edema 10/05/2012  . Herpes simplex with unspecified ophthalmic complication 1/54/0086  . Other and unspecified hyperlipidemia 10/04/2012  . Unspecified essential hypertension 10/04/2012  . Old myocardial infarction 10/04/2012  . Other atopic dermatitis and related conditions 10/04/2012  . Abnormality of gait 10/04/2012  . Elevated prostate specific antigen (PSA) 10/10/2006  . Coronary atherosclerosis of native coronary artery 10/10/2001  . Rheumatoid arthritis 05/2013  . Sinusitis, acute 06/2013    Augmentin x 3 weeks  . Gait disorder 08/07/2013  . Hypoxia 10/09/2013  . Chronic obstructive asthma, unspecified 11/15/2013    PFTs: 10/10/2013: Evidence of moderate obstructive  lung disease with small airway involvement (asthma); FEV1 54% predicted, FEV1/FVC <70%, FEF25-75% is reduced. Mild response to bronchodilator; FEV1 w/ 8% change after bronchodilator.       Past Surgical History  Procedure Laterality Date  . Angioplasty  2000  . Total hip arthroplasty  2001  . Back surgery      removed spinal tumor  . Cataract extraction w/ intraocular lens  implant, bilateral Bilateral 2013  . Colonoscopy  2010   Social History:   reports that he quit smoking about 52 years ago. He has never used smokeless tobacco. He reports that he drinks about 3.0 oz of alcohol per week. He reports that he does not use illicit drugs.  Family History  Problem Relation Age of Onset  . Stroke Mother     Medications: Patient's Medications  New Prescriptions   No medications on file  Previous Medications   ACYCLOVIR (ZOVIRAX) 400 MG TABLET    Take 400 mg by mouth. Take one tablet daily for virus in eyes   ALBUTEROL (PROAIR HFA) 108 (90 BASE) MCG/ACT INHALER    Inhale 2 puffs into the lungs every 6 (six) hours as needed for wheezing or shortness of breath.   ASPIRIN 81 MG TABLET    Take 81 mg by mouth daily.   ATORVASTATIN (LIPITOR) 80 MG TABLET    Take 80 mg by mouth. Take one daily for cholesterol   BUDESONIDE (PULMICORT) 0.25 MG/2ML NEBULIZER SOLUTION    Take 0.25 mg by nebulization daily.   CLOBETASOL CREAM (TEMOVATE) 0.05 %    Apply 1 application topically. As needed for eczema  FEXOFENADINE (ALLEGRA) 180 MG TABLET    Take 180 mg by mouth daily. Take one tablet daily for allergies   HYDROXYCHLOROQUINE (PLAQUENIL) 200 MG TABLET    Take 200 mg by mouth daily.   IBUPROFEN (ADVIL) 200 MG CAPS    Take by mouth. Take one as needed for pain   LEVOTHYROXINE (SYNTHROID, LEVOTHROID) 25 MCG TABLET    Take 25 mcg by mouth daily before breakfast.   METOPROLOL SUCCINATE (TOPROL-XL) 50 MG 24 HR TABLET    Take 50 mg by mouth. Take one tablet daily for blood pressure   MULTIPLE VITAMIN  (MULTIVITAMIN) TABLET    Take 1 tablet by mouth daily.   NITROGLYCERIN (NITROSTAT) 0.4 MG SL TABLET    Place 0.4 mg under the tongue every 5 (five) minutes as needed for chest pain.  Modified Medications   No medications on file  Discontinued Medications   No medications on file     Physical Exam: Filed Vitals:   11/28/14 1622  BP: 160/78  Pulse: 64    Physical Exam  Constitutional: He is oriented to person, place, and time. He appears well-developed and well-nourished.  Neck: Normal range of motion. Neck supple.  Cardiovascular: Normal rate, regular rhythm and normal heart sounds.   Pulmonary/Chest: Effort normal and breath sounds normal.  Abdominal: Soft. Bowel sounds are normal.  Neurological: He is alert and oriented to person, place, and time.  Skin: Skin is warm and dry.  Psychiatric: He has a normal mood and affect.    Labs reviewed: Basic Metabolic Panel:  Recent Labs  12/07/13  NA 145  K 4.3  BUN 20  CREATININE 1.1   Liver Function Tests:  Recent Labs  12/07/13  AST 17  ALT 12  ALKPHOS 82   No results for input(s): LIPASE, AMYLASE in the last 8760 hours. No results for input(s): AMMONIA in the last 8760 hours. CBC: No results for input(s): WBC, NEUTROABS, HGB, HCT, MCV, PLT in the last 8760 hours. TSH:  Recent Labs  12/07/13  TSH 3.98   A1C: No results found for: HGBA1C Lipid Panel:  Recent Labs  12/07/13  CHOL 158  HDL 50  LDLCALC 91  TRIG 71    Assessment/Plan  1. Advanced care planning/counseling discussion Majority of visit discussing and explaining DNR form. Also discussed MOST form. Pt and his wife report they would want everything done at this time. Form given to pt for him and his wife to further look over and make decisions about. Also discussed advanced directives, living will, HPOA  2. Essential hypertension -elevated today, pt does not take blood pressure at home.  Did not have time today for recheck but will come back for  lab work and clinic visit to discuss this further  3. Hypothyroidism, unspecified hypothyroidism type -will follow up TSH, conts on synthroid   4. Hyperlipemia -fasting lipids scheduled and CMP. conts on lipitor   To get blood work this week or early next week- follow up in 2 weeks for OV for medical management on chronic conditions

## 2014-11-29 DIAGNOSIS — E785 Hyperlipidemia, unspecified: Secondary | ICD-10-CM | POA: Diagnosis not present

## 2014-11-29 DIAGNOSIS — E039 Hypothyroidism, unspecified: Secondary | ICD-10-CM | POA: Diagnosis not present

## 2014-11-29 DIAGNOSIS — I10 Essential (primary) hypertension: Secondary | ICD-10-CM | POA: Diagnosis not present

## 2014-11-29 DIAGNOSIS — R609 Edema, unspecified: Secondary | ICD-10-CM | POA: Diagnosis not present

## 2014-11-29 LAB — HEPATIC FUNCTION PANEL
ALK PHOS: 91 U/L (ref 25–125)
ALT: 20 U/L (ref 10–40)
AST: 19 U/L (ref 14–40)
Bilirubin, Total: 0.3 mg/dL

## 2014-11-29 LAB — CBC AND DIFFERENTIAL
HEMATOCRIT: 37 % — AB (ref 41–53)
HEMOGLOBIN: 12.3 g/dL — AB (ref 13.5–17.5)
Platelets: 349 10*3/uL (ref 150–399)
WBC: 12.8 10*3/mL

## 2014-11-29 LAB — LIPID PANEL
Cholesterol: 138 mg/dL (ref 0–200)
HDL: 66 mg/dL (ref 35–70)
LDL Cholesterol: 62 mg/dL
LDL/HDL RATIO: 0.9
Triglycerides: 75 mg/dL (ref 40–160)

## 2014-11-29 LAB — BASIC METABOLIC PANEL
BUN: 28 mg/dL — AB (ref 4–21)
CREATININE: 1.2 mg/dL (ref 0.6–1.3)
Glucose: 89 mg/dL
POTASSIUM: 4.3 mmol/L (ref 3.4–5.3)
SODIUM: 143 mmol/L (ref 137–147)

## 2014-11-29 LAB — TSH: TSH: 7.63 u[IU]/mL — AB (ref 0.41–5.90)

## 2014-12-03 ENCOUNTER — Telehealth: Payer: Self-pay

## 2014-12-03 NOTE — Telephone Encounter (Signed)
Patient called back, no fevers, no chills, no urinary problems. Kevin Kane will discuss labs on 12/19/14 appt. Patient was fine with this, will call if he has any problems.

## 2014-12-03 NOTE — Telephone Encounter (Signed)
Left message for patient to call regarding his labs done 11/29/14

## 2014-12-14 DIAGNOSIS — I8311 Varicose veins of right lower extremity with inflammation: Secondary | ICD-10-CM | POA: Diagnosis not present

## 2014-12-14 DIAGNOSIS — L820 Inflamed seborrheic keratosis: Secondary | ICD-10-CM | POA: Diagnosis not present

## 2014-12-14 DIAGNOSIS — I8312 Varicose veins of left lower extremity with inflammation: Secondary | ICD-10-CM | POA: Diagnosis not present

## 2014-12-14 DIAGNOSIS — L570 Actinic keratosis: Secondary | ICD-10-CM | POA: Diagnosis not present

## 2014-12-19 ENCOUNTER — Encounter: Payer: Self-pay | Admitting: Nurse Practitioner

## 2014-12-19 ENCOUNTER — Non-Acute Institutional Stay: Payer: Medicare Other | Admitting: Nurse Practitioner

## 2014-12-19 VITALS — BP 144/82 | HR 64 | Temp 97.7°F | Wt 263.0 lb

## 2014-12-19 DIAGNOSIS — J449 Chronic obstructive pulmonary disease, unspecified: Secondary | ICD-10-CM

## 2014-12-19 DIAGNOSIS — I10 Essential (primary) hypertension: Secondary | ICD-10-CM | POA: Diagnosis not present

## 2014-12-19 DIAGNOSIS — E785 Hyperlipidemia, unspecified: Secondary | ICD-10-CM | POA: Diagnosis not present

## 2014-12-19 DIAGNOSIS — E039 Hypothyroidism, unspecified: Secondary | ICD-10-CM | POA: Diagnosis not present

## 2014-12-19 MED ORDER — LEVOTHYROXINE SODIUM 50 MCG PO TABS: 50.0000 ug | ORAL_TABLET | Freq: Every day | ORAL | Status: DC

## 2014-12-19 MED ORDER — ALBUTEROL SULFATE HFA 108 (90 BASE) MCG/ACT IN AERS: 2.0000 | INHALATION_SPRAY | Freq: Four times a day (QID) | RESPIRATORY_TRACT | Status: DC | PRN

## 2014-12-19 NOTE — Progress Notes (Signed)
Patient ID: Kevin Kane, male   DOB: [birth year], 81 y.o.   MRN: [id number]    Nursing Home Location:  [place] of Service: Clinic (12)  PCP: Estill Dooms, MD  Allergies  Allergen Reactions  . Ketoconazole     Chief Complaint  Patient presents with  . Medical Management of Chronic Issues    blood pressure, thyroid, cholesterol    HPI:  Patient is a 81 y.o. male seen today at [facility] for follow up.  He has a past medical history of COPD, HTN, hypothyroidism,hyperlipidemia, and obesity.  He is feeling well today and has no complaints. Reports he has been seeing dermatology recent with some skin lesion frozen off. Ongoing follow up with cardiology as well.   Review of Systems:  Review of Systems  Constitutional: Negative for fever, chills and fatigue.  HENT: Negative.   Respiratory: Positive for shortness of breath (chronic, only with activity). Negative for cough.   Cardiovascular: Negative for chest pain, palpitations and leg swelling.  Gastrointestinal: Negative.   Genitourinary: Negative for urgency, frequency and difficulty urinating.  Musculoskeletal: Negative for back pain, arthralgias and gait problem.  Skin: Positive for color change (venous statis bilateral lower extremtities.).  Neurological: Negative for dizziness, light-headedness and headaches.  Psychiatric/Behavioral: Negative.     Past Medical History  Diagnosis Date  . Osteoarthrosis, unspecified whether generalized or localized, unspecified site 10/10/2012  . Morbid obesity 10/05/2012  . Other specified erythematous condition(695.89) 10/05/2012  . Edema 10/05/2012  . Herpes simplex with unspecified ophthalmic complication 03/29/6282  . Other and unspecified hyperlipidemia 10/04/2012  . Unspecified essential hypertension 10/04/2012  . Old myocardial infarction 10/04/2012  . Other atopic dermatitis and related conditions 10/04/2012  . Abnormality of gait  10/04/2012  . Elevated prostate specific antigen (PSA) 10/10/2006  . Coronary atherosclerosis of native coronary artery 10/10/2001  . Rheumatoid arthritis 05/2013  . Sinusitis, acute 06/2013    Augmentin x 3 weeks  . Gait disorder 08/07/2013  . Hypoxia 10/09/2013  . Chronic obstructive asthma, unspecified 11/15/2013    PFTs: 10/10/2013: Evidence of moderate obstructive lung disease with small airway involvement (asthma); FEV1 54% predicted, FEV1/FVC <70%, FEF25-75% is reduced. Mild response to bronchodilator; FEV1 w/ 8% change after bronchodilator.       Past Surgical History  Procedure Laterality Date  . Angioplasty  2000  . Total hip arthroplasty  2001  . Back surgery      removed spinal tumor  . Cataract extraction w/ intraocular lens  implant, bilateral Bilateral 2013  . Colonoscopy  2010   Social History:   reports that he quit smoking about 52 years ago. He has never used smokeless tobacco. He reports that he drinks about 3.0 oz of alcohol per week. He reports that he does not use illicit drugs.  Family History  Problem Relation Age of Onset  . Stroke Mother     Medications: Patient's Medications  New Prescriptions   No medications on file  Previous Medications   ACYCLOVIR (ZOVIRAX) 400 MG TABLET    Take 400 mg by mouth. Take one tablet daily for virus in eyes   ALBUTEROL (PROAIR HFA) 108 (90 BASE) MCG/ACT INHALER    Inhale 2 puffs into the lungs every 6 (six) hours as needed for wheezing or shortness of breath.   ASPIRIN 81 MG TABLET    Take 81 mg by mouth daily.   ATORVASTATIN (LIPITOR) 80 MG TABLET    Take 80 mg by  mouth. Take one daily for cholesterol   BUDESONIDE (PULMICORT) 0.25 MG/2ML NEBULIZER SOLUTION    Take 0.25 mg by nebulization daily.   CLOBETASOL CREAM (TEMOVATE) 0.05 %    Apply 1 application topically. As needed for eczema   FEXOFENADINE (ALLEGRA) 180 MG TABLET    Take 180 mg by mouth daily. Take one tablet daily for allergies   HYDROXYCHLOROQUINE (PLAQUENIL)  200 MG TABLET    Take 200 mg by mouth daily.   IBUPROFEN (ADVIL) 200 MG CAPS    Take by mouth. Take one as needed for pain   LEVOTHYROXINE (SYNTHROID, LEVOTHROID) 25 MCG TABLET    Take 25 mcg by mouth daily before breakfast.   METOPROLOL SUCCINATE (TOPROL-XL) 50 MG 24 HR TABLET    Take 50 mg by mouth. Take one tablet daily for blood pressure   MULTIPLE VITAMIN (MULTIVITAMIN) TABLET    Take 1 tablet by mouth daily.   NITROGLYCERIN (NITROSTAT) 0.4 MG SL TABLET    Place 0.4 mg under the tongue every 5 (five) minutes as needed for chest pain.  Modified Medications   No medications on file  Discontinued Medications   No medications on file     Physical Exam: Filed Vitals:   12/19/14 0909  BP: 144/82  Pulse: 64  Temp: 97.7 F (36.5 C)  TempSrc: Oral  Weight: 263 lb (119.296 kg)    Physical Exam  Constitutional: He is oriented to person, place, and time. He appears well-developed and well-nourished.  HENT:  Head: Normocephalic and atraumatic.  Neck: Normal range of motion. Neck supple. No JVD present.  Cardiovascular: Normal rate, regular rhythm and normal heart sounds.   Pulmonary/Chest: Effort normal and breath sounds normal. He has no wheezes.  Musculoskeletal: Normal range of motion.  Lymphadenopathy:    He has no cervical adenopathy.  Neurological: He is alert and oriented to person, place, and time.  Skin: Skin is warm and dry.  Psychiatric: He has a normal mood and affect.    Labs reviewed: Basic Metabolic Panel:  Recent Labs  11/29/14  NA 143  K 4.3  BUN 28*  CREATININE 1.2   Liver Function Tests:  Recent Labs  11/29/14  AST 19  ALT 20  ALKPHOS 91   No results for input(s): LIPASE, AMYLASE in the last 8760 hours. No results for input(s): AMMONIA in the last 8760 hours. CBC:  Recent Labs  11/29/14  WBC 12.8  HGB 12.3*  HCT 37*  PLT 349   TSH:  Recent Labs  11/29/14  TSH 7.63*   A1C: No results found for: HGBA1C Lipid Panel:  Recent Labs   11/29/14  CHOL 138  HDL 66  LDLCALC 62  TRIG 75     Assessment/Plan 1. COPD Patient reports using Albuterol inhaler one time every other week. Patient reports shortness of breath with activity, but no wheezes.  Does not use Pulmicort nebulizer.    2. Hyperlipidemia Lipids are within desirable range.  conts on Lipitor, Encouraged heart healthy diet and light exercise.   3. Hypothyroidism Taking synthroid 25 mcg daily with elevated TSH.  Patient has gained almost 20 pounds in the last year (however he reports last weight was not accurate) -does report having less energy. Will increase Synthroid to 63mcg. Will recheck TSH in 8 weeks. Mr. Hodapp will be in [place], and he will have it rechecked there.    4. Obesity Encouraged patient to follow a heart healthy diet, to make good food choices and include fruits  and veggies.   5. Hypertension conts on metoprolol 50 mg daily,  Improved from last visit -encouraged lifestyle modifications and ongoing follow up of blood pressure.  -reports ongoing follow up with cardiologist as well  Going to [place] for the summer, will be seeing PCP there. To follow up with [facility] once he returns in September

## 2014-12-26 ENCOUNTER — Other Ambulatory Visit: Payer: Self-pay

## 2014-12-26 DIAGNOSIS — J449 Chronic obstructive pulmonary disease, unspecified: Secondary | ICD-10-CM

## 2014-12-26 MED ORDER — ALBUTEROL SULFATE HFA 108 (90 BASE) MCG/ACT IN AERS: 2.0000 | INHALATION_SPRAY | Freq: Four times a day (QID) | RESPIRATORY_TRACT | Status: DC | PRN

## 2015-01-08 ENCOUNTER — Encounter: Payer: Self-pay | Admitting: Internal Medicine

## 2015-01-31 DIAGNOSIS — H35363 Drusen (degenerative) of macula, bilateral: Secondary | ICD-10-CM | POA: Diagnosis not present

## 2015-01-31 DIAGNOSIS — H26493 Other secondary cataract, bilateral: Secondary | ICD-10-CM | POA: Diagnosis not present

## 2015-01-31 DIAGNOSIS — H04223 Epiphora due to insufficient drainage, bilateral lacrimal glands: Secondary | ICD-10-CM | POA: Diagnosis not present

## 2015-02-14 DIAGNOSIS — H26492 Other secondary cataract, left eye: Secondary | ICD-10-CM | POA: Diagnosis not present

## 2015-02-28 DIAGNOSIS — E039 Hypothyroidism, unspecified: Secondary | ICD-10-CM | POA: Diagnosis not present

## 2015-02-28 LAB — TSH: TSH: 3.81 u[IU]/mL (ref 0.41–5.90)

## 2015-03-05 ENCOUNTER — Encounter: Payer: Self-pay | Admitting: *Deleted

## 2015-03-18 ENCOUNTER — Other Ambulatory Visit: Payer: Self-pay | Admitting: Nurse Practitioner

## 2015-03-21 DIAGNOSIS — Z23 Encounter for immunization: Secondary | ICD-10-CM | POA: Diagnosis not present

## 2015-03-21 DIAGNOSIS — E039 Hypothyroidism, unspecified: Secondary | ICD-10-CM | POA: Diagnosis not present

## 2015-03-21 DIAGNOSIS — E780 Pure hypercholesterolemia: Secondary | ICD-10-CM | POA: Diagnosis not present

## 2015-03-21 DIAGNOSIS — Z0000 Encounter for general adult medical examination without abnormal findings: Secondary | ICD-10-CM | POA: Diagnosis not present

## 2015-03-21 DIAGNOSIS — Z6839 Body mass index (BMI) 39.0-39.9, adult: Secondary | ICD-10-CM | POA: Diagnosis not present

## 2015-05-01 DIAGNOSIS — N401 Enlarged prostate with lower urinary tract symptoms: Secondary | ICD-10-CM | POA: Diagnosis not present

## 2015-05-01 DIAGNOSIS — R972 Elevated prostate specific antigen [PSA]: Secondary | ICD-10-CM | POA: Diagnosis not present

## 2015-05-06 DIAGNOSIS — E780 Pure hypercholesterolemia: Secondary | ICD-10-CM | POA: Diagnosis not present

## 2015-05-06 DIAGNOSIS — I2510 Atherosclerotic heart disease of native coronary artery without angina pectoris: Secondary | ICD-10-CM | POA: Diagnosis not present

## 2015-05-06 DIAGNOSIS — I255 Ischemic cardiomyopathy: Secondary | ICD-10-CM | POA: Diagnosis not present

## 2015-05-06 DIAGNOSIS — N401 Enlarged prostate with lower urinary tract symptoms: Secondary | ICD-10-CM | POA: Diagnosis not present

## 2015-05-06 DIAGNOSIS — N201 Calculus of ureter: Secondary | ICD-10-CM | POA: Diagnosis not present

## 2015-05-06 DIAGNOSIS — Z955 Presence of coronary angioplasty implant and graft: Secondary | ICD-10-CM | POA: Diagnosis not present

## 2015-05-06 DIAGNOSIS — R972 Elevated prostate specific antigen [PSA]: Secondary | ICD-10-CM | POA: Diagnosis not present

## 2015-05-06 DIAGNOSIS — I6523 Occlusion and stenosis of bilateral carotid arteries: Secondary | ICD-10-CM | POA: Diagnosis not present

## 2015-05-06 DIAGNOSIS — R350 Frequency of micturition: Secondary | ICD-10-CM | POA: Diagnosis not present

## 2015-05-06 DIAGNOSIS — E6609 Other obesity due to excess calories: Secondary | ICD-10-CM | POA: Diagnosis not present

## 2015-05-09 DIAGNOSIS — I8312 Varicose veins of left lower extremity with inflammation: Secondary | ICD-10-CM | POA: Diagnosis not present

## 2015-05-09 DIAGNOSIS — I8311 Varicose veins of right lower extremity with inflammation: Secondary | ICD-10-CM | POA: Diagnosis not present

## 2015-05-09 DIAGNOSIS — L820 Inflamed seborrheic keratosis: Secondary | ICD-10-CM | POA: Diagnosis not present

## 2015-05-09 DIAGNOSIS — L930 Discoid lupus erythematosus: Secondary | ICD-10-CM | POA: Diagnosis not present

## 2015-05-09 DIAGNOSIS — D0439 Carcinoma in situ of skin of other parts of face: Secondary | ICD-10-CM | POA: Diagnosis not present

## 2015-05-09 DIAGNOSIS — L821 Other seborrheic keratosis: Secondary | ICD-10-CM | POA: Diagnosis not present

## 2015-05-09 DIAGNOSIS — D225 Melanocytic nevi of trunk: Secondary | ICD-10-CM | POA: Diagnosis not present

## 2015-05-09 DIAGNOSIS — D485 Neoplasm of uncertain behavior of skin: Secondary | ICD-10-CM | POA: Diagnosis not present

## 2015-05-09 DIAGNOSIS — S50912A Unspecified superficial injury of left forearm, initial encounter: Secondary | ICD-10-CM | POA: Diagnosis not present

## 2015-05-09 DIAGNOSIS — L570 Actinic keratosis: Secondary | ICD-10-CM | POA: Diagnosis not present

## 2015-05-09 DIAGNOSIS — X32XXXA Exposure to sunlight, initial encounter: Secondary | ICD-10-CM | POA: Diagnosis not present

## 2015-05-30 DIAGNOSIS — Z955 Presence of coronary angioplasty implant and graft: Secondary | ICD-10-CM | POA: Diagnosis not present

## 2015-05-30 DIAGNOSIS — I2510 Atherosclerotic heart disease of native coronary artery without angina pectoris: Secondary | ICD-10-CM | POA: Diagnosis not present

## 2015-05-30 DIAGNOSIS — I255 Ischemic cardiomyopathy: Secondary | ICD-10-CM | POA: Diagnosis not present

## 2015-05-31 DIAGNOSIS — Z23 Encounter for immunization: Secondary | ICD-10-CM | POA: Diagnosis not present

## 2015-06-20 DIAGNOSIS — L570 Actinic keratosis: Secondary | ICD-10-CM | POA: Diagnosis not present

## 2015-06-20 DIAGNOSIS — C44329 Squamous cell carcinoma of skin of other parts of face: Secondary | ICD-10-CM | POA: Diagnosis not present

## 2015-06-22 DIAGNOSIS — K8020 Calculus of gallbladder without cholecystitis without obstruction: Secondary | ICD-10-CM | POA: Diagnosis not present

## 2015-06-22 DIAGNOSIS — I2510 Atherosclerotic heart disease of native coronary artery without angina pectoris: Secondary | ICD-10-CM | POA: Diagnosis not present

## 2015-06-22 DIAGNOSIS — R197 Diarrhea, unspecified: Secondary | ICD-10-CM | POA: Diagnosis not present

## 2015-06-22 DIAGNOSIS — R0602 Shortness of breath: Secondary | ICD-10-CM | POA: Diagnosis not present

## 2015-06-22 DIAGNOSIS — R079 Chest pain, unspecified: Secondary | ICD-10-CM | POA: Diagnosis not present

## 2015-06-22 DIAGNOSIS — N200 Calculus of kidney: Secondary | ICD-10-CM | POA: Diagnosis not present

## 2015-06-22 DIAGNOSIS — N201 Calculus of ureter: Secondary | ICD-10-CM | POA: Diagnosis not present

## 2015-06-23 DIAGNOSIS — R0602 Shortness of breath: Secondary | ICD-10-CM | POA: Diagnosis not present

## 2015-06-23 DIAGNOSIS — R079 Chest pain, unspecified: Secondary | ICD-10-CM | POA: Diagnosis not present

## 2015-06-24 DIAGNOSIS — R0602 Shortness of breath: Secondary | ICD-10-CM | POA: Diagnosis not present

## 2015-06-27 DIAGNOSIS — E6609 Other obesity due to excess calories: Secondary | ICD-10-CM | POA: Diagnosis not present

## 2015-06-27 DIAGNOSIS — J984 Other disorders of lung: Secondary | ICD-10-CM | POA: Diagnosis not present

## 2015-06-27 DIAGNOSIS — J4520 Mild intermittent asthma, uncomplicated: Secondary | ICD-10-CM | POA: Diagnosis not present

## 2015-06-27 DIAGNOSIS — R0609 Other forms of dyspnea: Secondary | ICD-10-CM | POA: Diagnosis not present

## 2015-07-10 ENCOUNTER — Non-Acute Institutional Stay: Payer: Medicare Other | Admitting: Internal Medicine

## 2015-07-10 ENCOUNTER — Encounter: Payer: Self-pay | Admitting: Internal Medicine

## 2015-07-10 VITALS — BP 132/82 | HR 80 | Temp 97.8°F | Wt 264.0 lb

## 2015-07-10 DIAGNOSIS — R1031 Right lower quadrant pain: Secondary | ICD-10-CM

## 2015-07-10 DIAGNOSIS — R5383 Other fatigue: Secondary | ICD-10-CM

## 2015-07-10 DIAGNOSIS — Z87442 Personal history of urinary calculi: Secondary | ICD-10-CM | POA: Diagnosis not present

## 2015-07-10 DIAGNOSIS — R1032 Left lower quadrant pain: Secondary | ICD-10-CM | POA: Diagnosis not present

## 2015-07-10 DIAGNOSIS — R109 Unspecified abdominal pain: Secondary | ICD-10-CM

## 2015-07-10 NOTE — Progress Notes (Signed)
Patient ID: Kevin Kane, male   DOB: March 06, 1930, 81 y.o.   MRN: [id number]   Location: [facility] Provider: Henri Baumler L. Mariea Clonts, D.O., C.M.D.  Goals of Care: Advanced Directive information Does patient have an advance directive?: Yes  Chief Complaint  Patient presents with  . Medical Management of Chronic Issues    Wants Urology Referral-will talk with Dr. Mariea Clonts about this    HPI: Patient is a 81 y.o. male seen in the office today for  Med mgt of chronic illnesses.  I have not seen him before--he had followed with Dr. Nyoka Cowden previously here at [facility] clinic.  Lives here 1/2 time and [place] half time.  6-8 mos of symptoms, takes a medicine in the am and then feels like he needs to urinate late in the morning.  Sometimes goes, sometimes doesn't.  He is concerned this is a reaction to a medication.  Doesn't feel confident to hold a medication on his own.    2 weeks ago Sat, he went out to dinner in [place], [place].  After dinner, he felt terrible.  Had awful back pain and stomach felt like it was bursting out.  Went to ED there and was found to have kidney stones (has had before).  Got IV with painkiller, felt ok by end.  Then got pain med and constipation pill and only needed one dosage and felt better.  Has had "stomach distress" on and off--diarrhea.  Will have 3-4 loose stools per day.  Not correlated with eating.  Had an episode last night before bed and first thing this am..  Has had last two days.  Is also in lower back/flanks.  Hasn't been to urologist recently re: this.  Called for a referral.    In general, does not feel well.  Feels woozy, tired.  Says he's short of breath more than usual (says he's fat and that's part of it).  Using a walker which is not typical for him.    No known fever, chills.  No constipation between.  Has had some regular bms.  No hematochezia, melena or hematuria.  Has lost appetite.  Cereal seems to make him go.  Also pasta does.    Review of Systems:  Review of  Systems  Constitutional: Positive for malaise/fatigue. Negative for fever and chills.  HENT: Negative for congestion.   Respiratory: Positive for shortness of breath.   Cardiovascular: Negative for chest pain and palpitations.  Gastrointestinal: Positive for abdominal pain and diarrhea. Negative for heartburn, nausea, vomiting, constipation, blood in stool and melena.  Genitourinary: Positive for flank pain. Negative for dysuria, urgency, frequency and hematuria.  Musculoskeletal: Positive for back pain. Negative for falls.       Unsteady balance  Skin: Negative for rash.  Neurological: Negative for dizziness and weakness.  Psychiatric/Behavioral: Negative for memory loss.    Past Medical History  Diagnosis Date  . Osteoarthrosis, unspecified whether generalized or localized, unspecified site 10/10/2012  . Morbid obesity ([place]) 10/05/2012  . Other specified erythematous condition(695.89) 10/05/2012  . Edema 10/05/2012  . Herpes simplex with unspecified ophthalmic complication 5/00/3704  . Other and unspecified hyperlipidemia 10/04/2012  . Unspecified essential hypertension 10/04/2012  . Old myocardial infarction 10/04/2012  . Other atopic dermatitis and related conditions 10/04/2012  . Abnormality of gait 10/04/2012  . Elevated prostate specific antigen (PSA) 10/10/2006  . Coronary atherosclerosis of native coronary artery 10/10/2001  . Rheumatoid arthritis ([place]) 05/2013  . Sinusitis, acute 06/2013    Augmentin x 3 weeks  .  Gait disorder 08/07/2013  . Hypoxia 10/09/2013  . Chronic obstructive asthma, unspecified 11/15/2013    PFTs: 10/10/2013: Evidence of moderate obstructive lung disease with small airway involvement (asthma); FEV1 54% predicted, FEV1/FVC <70%, FEF25-75% is reduced. Mild response to bronchodilator; FEV1 w/ 8% change after bronchodilator.        Past Surgical History  Procedure Laterality Date  . Angioplasty  2000  . Total hip arthroplasty  2001  . Back surgery      removed  spinal tumor  . Cataract extraction w/ intraocular lens  implant, bilateral Bilateral 2013  . Colonoscopy  2010    Allergies  Allergen Reactions  . Ketoconazole       Medication List       This list is accurate as of: 07/10/15 11:50 AM.  Always use your most recent med list.               acyclovir 400 MG tablet  Commonly known as:  ZOVIRAX  Take 400 mg by mouth. Take one tablet daily for virus in eyes     albuterol 108 (90 BASE) MCG/ACT inhaler  Commonly known as:  PROAIR HFA  Inhale 2 puffs into the lungs every 6 (six) hours as needed for wheezing or shortness of breath.     aspirin 81 MG tablet  Take 81 mg by mouth daily.     atorvastatin 80 MG tablet  Commonly known as:  LIPITOR  Take 80 mg by mouth. Take one daily for cholesterol     budesonide 0.25 MG/2ML nebulizer solution  Commonly known as:  PULMICORT  Take 0.25 mg by nebulization daily.     CERAVE Crea  Apply topically. Apply moisturizer as needed     ciclopirox 0.77 % cream  Commonly known as:  LOPROX  Apply topically. Apply behind knee for fungus as needed     clobetasol cream 0.05 %  Commonly known as:  TEMOVATE  Apply 1 application topically. As needed for eczema     fexofenadine 180 MG tablet  Commonly known as:  ALLEGRA  Take 180 mg by mouth daily. Take one tablet daily for allergies     fluocinonide cream 0.05 %  Commonly known as:  LIDEX  Apply 1 application topically. Apply cream as needed for eczzema     hydroxychloroquine 200 MG tablet  Commonly known as:  PLAQUENIL  Take 200 mg by mouth daily.     ipratropium-albuterol 0.5-2.5 (3) MG/3ML Soln  Commonly known as:  DUONEB  Take 3 mLs by nebulization. Use in nebulizer every 6 hours in emergency     levothyroxine 25 MCG tablet  Commonly known as:  SYNTHROID, LEVOTHROID  Take 25 mcg by mouth daily before breakfast.     metoprolol succinate 50 MG 24 hr tablet  Commonly known as:  TOPROL-XL  Take 50 mg by mouth. Take one tablet  daily for blood pressure     multivitamin tablet  Take 1 tablet by mouth daily.     nitroGLYCERIN 0.4 MG SL tablet  Commonly known as:  NITROSTAT  Place 0.4 mg under the tongue every 5 (five) minutes as needed for chest pain.     tolnaftate 1 % spray  Commonly known as:  TINACTIN  Apply topically. Spray for fungus as needed     triamcinolone cream 0.1 %  Commonly known as:  KENALOG  Apply 1 application topically. Apply for eczema behind ear and belly button as needed  Health Maintenance  Topic Date Due  . TETANUS/TDAP  08/07/1951  . ZOSTAVAX  08/06/1992  . PNA vac Low Risk Adult (1 of 2 - PCV13) 08/06/1997  . INFLUENZA VACCINE  04/22/2015    Physical Exam: Filed Vitals:   07/10/15 1140  BP: 132/82  Pulse: 80  Temp: 97.8 F (36.6 C)  TempSrc: Oral  Weight: 264 lb (119.75 kg)  SpO2: 97%   Body mass index is 41.34 kg/(m^2). Physical Exam  Constitutional: He is oriented to person, place, and time. He appears well-developed and well-nourished. No distress.  Cardiovascular: Normal rate, regular rhythm and normal heart sounds.   Pulmonary/Chest: Effort normal and breath sounds normal.  Abdominal: Soft. Bowel sounds are normal. He exhibits no distension and no mass. There is tenderness. There is no rebound and no guarding. No hernia.  Bilateral lower quadrants  Musculoskeletal: Normal range of motion.  Stooped posture and sidebent, walking with rollator walker  Neurological: He is alert and oriented to person, place, and time.  Skin: Skin is warm and dry.  Psychiatric: He has a normal mood and affect.    Labs reviewed: Basic Metabolic Panel:  Recent Labs  11/29/14 02/28/15  NA 143  --   K 4.3  --   BUN 28*  --   CREATININE 1.2  --   TSH 7.63* 3.81   Liver Function Tests:  Recent Labs  11/29/14  AST 19  ALT 20  ALKPHOS 91   No results for input(s): LIPASE, AMYLASE in the last 8760 hours. No results for input(s): AMMONIA in the last 8760  hours. CBC:  Recent Labs  11/29/14  WBC 12.8  HGB 12.3*  HCT 37*  PLT 349   Lipid Panel:  Recent Labs  11/29/14  CHOL 138  HDL 66  LDLCALC 62  TRIG 75   No results found for: HGBA1C  Procedures since last visit: Discharge papers from ED in [place], [place] reviewed--had CT abdomen/pelvis said to show a kidney stone on 06/22/15--was advised to push fluids and f/u with urology here if symptoms did not improve.  Labs said to be unremarkable.  I did not have copies of CT or labs.  Assessment/Plan 1. Bilateral lower abdominal pain -advised pt that it would be helpful to have a copy of his CT from [facility] ED 06/22/15 -suspect his pain may be related to #2, but unclear -has not had hematuria or typical pain  -now has loose stools also since that time--no sick contacts, fever, other travel, abx, bleeding -will check stool culture, cbc, bmp, liver panel, amylase, lipase  2. History of nephrolithiasis -said to have kidney stone on 10/1 that he does not ever remember passing , but he did not attempt to search for it either -symptoms had gotten better but then returned -referral to urology placed--Dr. Jeffie Pollock  3. Bilateral flank pain -ongoing -seems more like lower back pain, but he says this is the pain he also had during the kidney stone -wanted to do CT abdomen/pelvis, but pt does not want if urology is going to do their own tests anyway  4. Other fatigue -unclear etiology besides deconditioning which he does admit to -unclear why he now has loose stools--reviewed differential with him  Labs/tests ordered:  Cbc, bmp, liver panel, amylase, lipase Next appt:  prn  Thao Vanover L. Marthena Whitmyer, D.O. [facility] [address], [place] [address] Cell Phone (Mon-Fri 8am-5pm):  [phone] On Call:  [phone] & follow prompts after  5pm & weekends Office Phone:  [phone] Office Fax:  [phone]

## 2015-07-11 ENCOUNTER — Telehealth: Payer: Self-pay

## 2015-07-11 DIAGNOSIS — R197 Diarrhea, unspecified: Secondary | ICD-10-CM | POA: Diagnosis not present

## 2015-07-11 DIAGNOSIS — R102 Pelvic and perineal pain: Secondary | ICD-10-CM | POA: Diagnosis not present

## 2015-07-11 DIAGNOSIS — R5383 Other fatigue: Secondary | ICD-10-CM | POA: Diagnosis not present

## 2015-07-11 DIAGNOSIS — Z87442 Personal history of urinary calculi: Secondary | ICD-10-CM | POA: Diagnosis not present

## 2015-07-11 DIAGNOSIS — R1030 Lower abdominal pain, unspecified: Secondary | ICD-10-CM

## 2015-07-11 DIAGNOSIS — R109 Unspecified abdominal pain: Secondary | ICD-10-CM | POA: Diagnosis not present

## 2015-07-11 DIAGNOSIS — D72829 Elevated white blood cell count, unspecified: Secondary | ICD-10-CM

## 2015-07-11 LAB — HEPATIC FUNCTION PANEL
ALT: 25 U/L (ref 10–40)
AST: 21 U/L (ref 14–40)
Alkaline Phosphatase: 98 U/L (ref 25–125)
BILIRUBIN, TOTAL: 0.5 mg/dL
Bilirubin, Direct: 0.1 mg/dL

## 2015-07-11 LAB — BASIC METABOLIC PANEL
BUN: 30 mg/dL — AB (ref 4–21)
Creatinine: 1.9 mg/dL — AB (ref <=1.3)
Glucose: 106 mg/dL
POTASSIUM: 4.7 mmol/L (ref 3.4–5.3)
Sodium: 142 mmol/L (ref 137–147)

## 2015-07-11 LAB — CBC AND DIFFERENTIAL
HCT: 39 % — AB (ref 41–53)
HCT: 39 % — AB (ref 41–53)
HEMOGLOBIN: 12.6 g/dL — AB (ref 13.5–17.5)
Platelets: 505 10*3/uL — AB (ref 150–399)
WBC: 14.6 10*3/mL

## 2015-07-11 NOTE — Telephone Encounter (Signed)
Labs have revealed WBC of 14.6 suggesting infection.  This could be urinary/kidney or it could be diverticulitis considering his abdominal pain and diarrhea.  Let's have him collect a urine sample and bring it by the clinic and I'd really like him to have another CT abdomen/pelvis with and without contrast stat (I can place this order).    His kidney function has also worsened.  I have called [facility] and clinic nurse will contact pt to check on his condition.  If he is feeling very poorly or worse, he will be referred to the ED.  If not, above orders will be done.

## 2015-07-11 NOTE — Addendum Note (Signed)
Addended by: Hollace Kinnier L on: 07/11/2015 02:10 PM   Modules accepted: Orders

## 2015-07-11 NOTE — Telephone Encounter (Signed)
Mliss Sax called from [facility] to confirm if labs she faxed over had been received and given to doctor for review. Said they  needed to be reviewed as soon as possible Let her know  that we did receive them and they would be given to Dr. Mariea Clonts she is patients PCP

## 2015-07-11 NOTE — Telephone Encounter (Signed)
Mliss Sax called back for codes for lab work that had been ordered by doctor Mariea Clonts codes given to her.

## 2015-07-12 ENCOUNTER — Telehealth: Payer: Self-pay

## 2015-07-12 ENCOUNTER — Telehealth: Payer: Self-pay | Admitting: *Deleted

## 2015-07-12 ENCOUNTER — Ambulatory Visit
Admission: RE | Admit: 2015-07-12 | Discharge: 2015-07-12 | Disposition: A | Discharge status: Home / Self Care | Payer: Medicare Other | Source: Ambulatory Visit | Attending: Internal Medicine | Admitting: Internal Medicine

## 2015-07-12 DIAGNOSIS — R197 Diarrhea, unspecified: Secondary | ICD-10-CM | POA: Diagnosis not present

## 2015-07-12 DIAGNOSIS — D72829 Elevated white blood cell count, unspecified: Secondary | ICD-10-CM

## 2015-07-12 DIAGNOSIS — R1031 Right lower quadrant pain: Secondary | ICD-10-CM | POA: Diagnosis not present

## 2015-07-12 DIAGNOSIS — Z87442 Personal history of urinary calculi: Secondary | ICD-10-CM | POA: Diagnosis not present

## 2015-07-12 DIAGNOSIS — N202 Calculus of kidney with calculus of ureter: Secondary | ICD-10-CM | POA: Diagnosis not present

## 2015-07-12 DIAGNOSIS — R5383 Other fatigue: Secondary | ICD-10-CM | POA: Diagnosis not present

## 2015-07-12 DIAGNOSIS — R1030 Lower abdominal pain, unspecified: Secondary | ICD-10-CM

## 2015-07-12 NOTE — Telephone Encounter (Signed)
Patient would like his results. Would like to speak with a Provider. Please Call [phone]

## 2015-07-12 NOTE — Telephone Encounter (Signed)
CT reveals obstructing renal stone that is causing urine to back up into kidney - highly recommend ED eval especially if having sx's of obstructive uropathy.

## 2015-07-12 NOTE — Telephone Encounter (Signed)
Received call from Kevin Kane, patient is schedule to have CT of abd/pelvic with and without contrast. Dr. Carlis Abbott wants to change order without because patients Creanntine is high at 1.9. Gave the ok . GI to change order.

## 2015-07-12 NOTE — Telephone Encounter (Signed)
Spoke with patient gave him Dr. Vale Haven message. He is not having any symptoms with the kidney stone, doesn't feel he needs to go to ED for it. Concern more about the diarrhea. We don't have the stool culture back yet, but spoke with him about bland diet, he said he has been on a bland diet. If he starts having any pain will go to ED. I will call Urology office Monday and fax CT report to them. He was ok with this.

## 2015-07-12 NOTE — Telephone Encounter (Signed)
Patient wants you to call him with his CT Scan results he had done.  He would like to speak with Dr. Mariea Clonts. Please Call #: [phone]

## 2015-07-15 NOTE — Telephone Encounter (Signed)
noted 

## 2015-07-15 NOTE — Telephone Encounter (Signed)
Spoke with patient, he's not having any pain, and stomach is better, he thinks the medication for his arthritis is causing his stomach problem, wants me to ask Dr. Mariea Clonts about this.  Asked about Urology referral. I called Urology office spoke with Roselyn Reef in triage told her about the CT results, fax to [phone]. She will call the patient.

## 2015-07-18 ENCOUNTER — Telehealth: Payer: Self-pay

## 2015-07-18 DIAGNOSIS — N200 Calculus of kidney: Secondary | ICD-10-CM | POA: Diagnosis not present

## 2015-07-18 DIAGNOSIS — N1330 Unspecified hydronephrosis: Secondary | ICD-10-CM | POA: Diagnosis not present

## 2015-07-18 DIAGNOSIS — N201 Calculus of ureter: Secondary | ICD-10-CM | POA: Diagnosis not present

## 2015-07-18 NOTE — Telephone Encounter (Signed)
Spoke with patient about  07/11/15 stool culture, negative. His stomach and diarrhea is better. Told him that Dr. Mariea Clonts had reviewed his medication on the day he was in the office had any of them would had cause diarrhea she would had stop them on that day. He has appt today with Urologist. He will call if any problems come up.

## 2015-08-08 DIAGNOSIS — N1330 Unspecified hydronephrosis: Secondary | ICD-10-CM | POA: Diagnosis not present

## 2015-08-08 DIAGNOSIS — N200 Calculus of kidney: Secondary | ICD-10-CM | POA: Diagnosis not present

## 2015-08-08 DIAGNOSIS — N201 Calculus of ureter: Secondary | ICD-10-CM | POA: Diagnosis not present

## 2015-08-08 DIAGNOSIS — N210 Calculus in bladder: Secondary | ICD-10-CM | POA: Diagnosis not present

## 2015-08-09 ENCOUNTER — Encounter: Payer: Self-pay | Admitting: Internal Medicine

## 2015-08-27 DIAGNOSIS — N1330 Unspecified hydronephrosis: Secondary | ICD-10-CM | POA: Diagnosis not present

## 2015-08-27 DIAGNOSIS — N210 Calculus in bladder: Secondary | ICD-10-CM | POA: Diagnosis not present

## 2015-08-27 DIAGNOSIS — N201 Calculus of ureter: Secondary | ICD-10-CM | POA: Diagnosis not present

## 2015-09-17 ENCOUNTER — Other Ambulatory Visit: Payer: Self-pay | Admitting: Urology

## 2015-09-21 ENCOUNTER — Other Ambulatory Visit: Payer: Self-pay | Admitting: Internal Medicine

## 2015-09-25 ENCOUNTER — Encounter (HOSPITAL_BASED_OUTPATIENT_CLINIC_OR_DEPARTMENT_OTHER): Payer: Self-pay | Admitting: *Deleted

## 2015-09-26 ENCOUNTER — Encounter (HOSPITAL_BASED_OUTPATIENT_CLINIC_OR_DEPARTMENT_OTHER): Payer: Self-pay | Admitting: *Deleted

## 2015-09-26 NOTE — Progress Notes (Signed)
   09/26/15 1107  OBSTRUCTIVE SLEEP APNEA  Have you ever been diagnosed with sleep apnea through a sleep study? No  Do you snore loudly (loud enough to be heard through closed doors)?  0  Do you often feel tired, fatigued, or sleepy during the daytime (such as falling asleep during driving or talking to someone)? 0  Has anyone observed you stop breathing during your sleep? 0  Do you have, or are you being treated for high blood pressure? 1  BMI more than 35 kg/m2? 1  Age > 50 (1-yes) 1  Neck circumference greater than:Male 16 inches or larger, Male 17inches or larger? 1  Male Gender (Yes=1) 1  Obstructive Sleep Apnea Score 5  Score 5 or greater  Results sent to PCP

## 2015-09-26 NOTE — Progress Notes (Signed)
NPO AFTER MN.  ARRIVE AT 0945.  NEEDS ISTAT.  CURRENT EKG AND CXR IN EPIC UNDER CARE EVERYWHERE TAB.  WILL TAKE AM MEDS DOS W/ SIPS OF WATER.

## 2015-09-30 ENCOUNTER — Ambulatory Visit (HOSPITAL_BASED_OUTPATIENT_CLINIC_OR_DEPARTMENT_OTHER): Payer: Medicare Other | Admitting: Anesthesiology

## 2015-09-30 ENCOUNTER — Encounter (HOSPITAL_BASED_OUTPATIENT_CLINIC_OR_DEPARTMENT_OTHER): Payer: Self-pay | Admitting: Anesthesiology

## 2015-09-30 ENCOUNTER — Ambulatory Visit (HOSPITAL_COMMUNITY): Payer: Medicare Other

## 2015-09-30 ENCOUNTER — Ambulatory Visit (HOSPITAL_BASED_OUTPATIENT_CLINIC_OR_DEPARTMENT_OTHER)
Admission: RE | Admit: 2015-09-30 | Discharge: 2015-09-30 | Disposition: A | Discharge status: Home / Self Care | Payer: Medicare Other | Source: Ambulatory Visit | Attending: Urology | Admitting: Urology

## 2015-09-30 ENCOUNTER — Encounter (HOSPITAL_BASED_OUTPATIENT_CLINIC_OR_DEPARTMENT_OTHER)
Admission: RE | Disposition: A | Discharge status: Home / Self Care | Payer: Self-pay | Source: Ambulatory Visit | Attending: Urology

## 2015-09-30 DIAGNOSIS — E785 Hyperlipidemia, unspecified: Secondary | ICD-10-CM | POA: Insufficient documentation

## 2015-09-30 DIAGNOSIS — K219 Gastro-esophageal reflux disease without esophagitis: Secondary | ICD-10-CM | POA: Diagnosis not present

## 2015-09-30 DIAGNOSIS — N200 Calculus of kidney: Secondary | ICD-10-CM | POA: Diagnosis not present

## 2015-09-30 DIAGNOSIS — Z6841 Body Mass Index (BMI) 40.0 and over, adult: Secondary | ICD-10-CM | POA: Diagnosis not present

## 2015-09-30 DIAGNOSIS — Z87442 Personal history of urinary calculi: Secondary | ICD-10-CM | POA: Insufficient documentation

## 2015-09-30 DIAGNOSIS — N132 Hydronephrosis with renal and ureteral calculous obstruction: Secondary | ICD-10-CM | POA: Insufficient documentation

## 2015-09-30 DIAGNOSIS — N138 Other obstructive and reflux uropathy: Secondary | ICD-10-CM | POA: Diagnosis not present

## 2015-09-30 DIAGNOSIS — Z87891 Personal history of nicotine dependence: Secondary | ICD-10-CM | POA: Insufficient documentation

## 2015-09-30 DIAGNOSIS — Z79899 Other long term (current) drug therapy: Secondary | ICD-10-CM | POA: Insufficient documentation

## 2015-09-30 DIAGNOSIS — N210 Calculus in bladder: Secondary | ICD-10-CM | POA: Diagnosis not present

## 2015-09-30 DIAGNOSIS — M1990 Unspecified osteoarthritis, unspecified site: Secondary | ICD-10-CM | POA: Insufficient documentation

## 2015-09-30 DIAGNOSIS — J45909 Unspecified asthma, uncomplicated: Secondary | ICD-10-CM | POA: Diagnosis not present

## 2015-09-30 DIAGNOSIS — Z7982 Long term (current) use of aspirin: Secondary | ICD-10-CM | POA: Diagnosis not present

## 2015-09-30 DIAGNOSIS — I10 Essential (primary) hypertension: Secondary | ICD-10-CM | POA: Diagnosis not present

## 2015-09-30 DIAGNOSIS — E039 Hypothyroidism, unspecified: Secondary | ICD-10-CM | POA: Insufficient documentation

## 2015-09-30 DIAGNOSIS — N201 Calculus of ureter: Secondary | ICD-10-CM | POA: Diagnosis present

## 2015-09-30 DIAGNOSIS — Z955 Presence of coronary angioplasty implant and graft: Secondary | ICD-10-CM | POA: Insufficient documentation

## 2015-09-30 DIAGNOSIS — J449 Chronic obstructive pulmonary disease, unspecified: Secondary | ICD-10-CM | POA: Diagnosis not present

## 2015-09-30 DIAGNOSIS — I252 Old myocardial infarction: Secondary | ICD-10-CM | POA: Diagnosis not present

## 2015-09-30 DIAGNOSIS — Z96649 Presence of unspecified artificial hip joint: Secondary | ICD-10-CM | POA: Insufficient documentation

## 2015-09-30 DIAGNOSIS — I2510 Atherosclerotic heart disease of native coronary artery without angina pectoris: Secondary | ICD-10-CM | POA: Diagnosis not present

## 2015-09-30 DIAGNOSIS — Z01818 Encounter for other preprocedural examination: Secondary | ICD-10-CM | POA: Diagnosis not present

## 2015-09-30 HISTORY — DX: Hypothyroidism, unspecified: E03.9

## 2015-09-30 HISTORY — DX: Gastro-esophageal reflux disease without esophagitis: K21.9

## 2015-09-30 HISTORY — DX: Urgency of urination: R39.15

## 2015-09-30 HISTORY — DX: Mild intermittent asthma, uncomplicated: J45.20

## 2015-09-30 HISTORY — PX: CYSTOSCOPY WITH RETROGRADE PYELOGRAM, URETEROSCOPY AND STENT PLACEMENT: SHX5789

## 2015-09-30 HISTORY — DX: Unspecified osteoarthritis, unspecified site: M19.90

## 2015-09-30 HISTORY — DX: Frequency of micturition: R35.0

## 2015-09-30 HISTORY — DX: Chronic obstructive pulmonary disease, unspecified: J44.9

## 2015-09-30 HISTORY — DX: Localized edema: R60.0

## 2015-09-30 HISTORY — DX: Dermatitis, unspecified: L30.9

## 2015-09-30 HISTORY — DX: Essential (primary) hypertension: I10

## 2015-09-30 HISTORY — DX: Presence of external hearing-aid: Z97.4

## 2015-09-30 HISTORY — DX: Calculus of ureter: N20.1

## 2015-09-30 HISTORY — DX: Old myocardial infarction: I25.2

## 2015-09-30 HISTORY — DX: Presence of spectacles and contact lenses: Z97.3

## 2015-09-30 HISTORY — DX: Herpesviral ocular disease, unspecified: B00.50

## 2015-09-30 HISTORY — DX: Personal history of urinary calculi: Z87.442

## 2015-09-30 HISTORY — DX: Atrioventricular block, first degree: I44.0

## 2015-09-30 HISTORY — DX: Calculus of kidney: N20.0

## 2015-09-30 HISTORY — DX: Hyperlipidemia, unspecified: E78.5

## 2015-09-30 HISTORY — DX: Other specified personal risk factors, not elsewhere classified: Z91.89

## 2015-09-30 LAB — POCT I-STAT 4, (NA,K, GLUC, HGB,HCT)
Glucose, Bld: 100 mg/dL — ABNORMAL HIGH (ref 65–99)
HCT: 35 % — ABNORMAL LOW (ref 39.0–52.0)
Hemoglobin: 11.9 g/dL — ABNORMAL LOW (ref 13.0–17.0)
Potassium: 4.8 mmol/L (ref 3.5–5.1)
Sodium: 147 mmol/L — ABNORMAL HIGH (ref 135–145)

## 2015-09-30 SURGERY — CYSTOURETEROSCOPY, WITH RETROGRADE PYELOGRAM AND STENT INSERTION
Anesthesia: General | Site: Renal | Laterality: Right

## 2015-09-30 MED ORDER — FENTANYL CITRATE (PF) 100 MCG/2ML IJ SOLN
25.0000 ug | INTRAMUSCULAR | Status: DC | PRN
  Filled 2015-09-30: qty 1

## 2015-09-30 MED ORDER — TAMSULOSIN HCL 0.4 MG PO CAPS
ORAL_CAPSULE | ORAL | Status: AC
  Filled 2015-09-30: qty 1

## 2015-09-30 MED ORDER — LIDOCAINE HCL (CARDIAC) 20 MG/ML IV SOLN
INTRAVENOUS | Status: AC
  Filled 2015-09-30: qty 5

## 2015-09-30 MED ORDER — EPHEDRINE SULFATE 50 MG/ML IJ SOLN
INTRAMUSCULAR | Status: AC
  Filled 2015-09-30: qty 1

## 2015-09-30 MED ORDER — LIDOCAINE HCL (CARDIAC) 20 MG/ML IV SOLN
INTRAVENOUS | Status: DC | PRN
  Administered 2015-09-30: 80 mg via INTRAVENOUS

## 2015-09-30 MED ORDER — CIPROFLOXACIN IN D5W 200 MG/100ML IV SOLN
INTRAVENOUS | Status: AC
  Filled 2015-09-30: qty 100

## 2015-09-30 MED ORDER — IOHEXOL 350 MG/ML SOLN
INTRAVENOUS | Status: DC | PRN
  Administered 2015-09-30: 5 mL

## 2015-09-30 MED ORDER — ONDANSETRON HCL 4 MG/2ML IJ SOLN
INTRAMUSCULAR | Status: DC | PRN
  Administered 2015-09-30: 4 mg via INTRAVENOUS

## 2015-09-30 MED ORDER — PROPOFOL 500 MG/50ML IV EMUL
INTRAVENOUS | Status: DC | PRN
  Administered 2015-09-30: 170 mL via INTRAVENOUS

## 2015-09-30 MED ORDER — PHENAZOPYRIDINE HCL 100 MG PO TABS
ORAL_TABLET | ORAL | Status: AC
  Filled 2015-09-30: qty 2

## 2015-09-30 MED ORDER — FENTANYL CITRATE (PF) 100 MCG/2ML IJ SOLN
INTRAMUSCULAR | Status: AC
  Filled 2015-09-30: qty 2

## 2015-09-30 MED ORDER — FENTANYL CITRATE (PF) 250 MCG/5ML IJ SOLN
INTRAMUSCULAR | Status: AC
  Filled 2015-09-30: qty 5

## 2015-09-30 MED ORDER — HYDROCODONE-ACETAMINOPHEN 10-325 MG PO TABS: 1.0000 | ORAL_TABLET | ORAL | Status: DC | PRN

## 2015-09-30 MED ORDER — PHENAZOPYRIDINE HCL 200 MG PO TABS: 200.0000 mg | ORAL_TABLET | Freq: Three times a day (TID) | ORAL | Status: DC | PRN

## 2015-09-30 MED ORDER — SODIUM CHLORIDE 0.9 % IR SOLN
Status: DC | PRN
  Administered 2015-09-30: 3000 mL
  Administered 2015-09-30: 1000 mL

## 2015-09-30 MED ORDER — LACTATED RINGERS IV SOLN
INTRAVENOUS | Status: DC
  Administered 2015-09-30 (×3): via INTRAVENOUS
  Filled 2015-09-30: qty 1000

## 2015-09-30 MED ORDER — MIDAZOLAM HCL 2 MG/2ML IJ SOLN
INTRAMUSCULAR | Status: AC
  Filled 2015-09-30: qty 2

## 2015-09-30 MED ORDER — DEXAMETHASONE SODIUM PHOSPHATE 10 MG/ML IJ SOLN
INTRAMUSCULAR | Status: AC
  Filled 2015-09-30: qty 1

## 2015-09-30 MED ORDER — CIPROFLOXACIN IN D5W 200 MG/100ML IV SOLN
200.0000 mg | INTRAVENOUS | Status: AC
  Administered 2015-09-30: 200 mg via INTRAVENOUS
  Filled 2015-09-30: qty 100

## 2015-09-30 MED ORDER — PROMETHAZINE HCL 25 MG/ML IJ SOLN
6.2500 mg | INTRAMUSCULAR | Status: DC | PRN
  Filled 2015-09-30: qty 1

## 2015-09-30 MED ORDER — PHENAZOPYRIDINE HCL 200 MG PO TABS
200.0000 mg | ORAL_TABLET | Freq: Once | ORAL | Status: AC
  Administered 2015-09-30: 200 mg via ORAL
  Filled 2015-09-30: qty 1

## 2015-09-30 MED ORDER — HYDROCODONE-ACETAMINOPHEN 7.5-325 MG PO TABS
1.0000 | ORAL_TABLET | Freq: Once | ORAL | Status: DC | PRN
  Filled 2015-09-30: qty 1

## 2015-09-30 MED ORDER — STERILE WATER FOR INJECTION IJ SOLN
INTRAMUSCULAR | Status: AC
  Filled 2015-09-30: qty 10

## 2015-09-30 MED ORDER — PROPOFOL 10 MG/ML IV BOLUS
INTRAVENOUS | Status: AC
  Filled 2015-09-30: qty 20

## 2015-09-30 MED ORDER — ONDANSETRON HCL 4 MG/2ML IJ SOLN
INTRAMUSCULAR | Status: AC
  Filled 2015-09-30: qty 4

## 2015-09-30 MED ORDER — TAMSULOSIN HCL 0.4 MG PO CAPS
0.4000 mg | ORAL_CAPSULE | Freq: Once | ORAL | Status: AC
  Administered 2015-09-30: 0.4 mg via ORAL
  Filled 2015-09-30: qty 1

## 2015-09-30 MED ORDER — FENTANYL CITRATE (PF) 100 MCG/2ML IJ SOLN
INTRAMUSCULAR | Status: DC | PRN
  Administered 2015-09-30 (×2): 25 ug via INTRAVENOUS

## 2015-09-30 SURGICAL SUPPLY — 45 items
ADAPTER CATH URET PLST 4-6FR (CATHETERS) IMPLANT
ADAPTER IRRIG TUBE 2 SPIKE SOL (ADAPTER) ×3 IMPLANT
BAG DRAIN CYSTO-URO STER (DRAIN) ×3 IMPLANT
BAG DRAIN URO-CYSTO SKYTR STRL (DRAIN) ×3 IMPLANT
BASKET LASER NITINOL 1.9FR (BASKET) IMPLANT
BASKET STNLS GEMINI 4WIRE 3FR (BASKET) IMPLANT
BASKET ZERO TIP NITINOL 2.4FR (BASKET) IMPLANT
CATH CLEAR GEL 3F BACKSTOP (CATHETERS) IMPLANT
CATH COUDE FOLEY 2W 5CC 22FR (CATHETERS) ×3 IMPLANT
CATH INTERMIT  6FR 70CM (CATHETERS) ×3 IMPLANT
CATH URET 5FR 28IN CONE TIP (BALLOONS)
CATH URET 5FR 70CM CONE TIP (BALLOONS) IMPLANT
CATH URET DUAL LUMEN 6-10FR 50 (CATHETERS) ×3 IMPLANT
CLOTH BEACON ORANGE TIMEOUT ST (SAFETY) ×3 IMPLANT
ELECT REM PT RETURN 9FT ADLT (ELECTROSURGICAL)
ELECTRODE REM PT RTRN 9FT ADLT (ELECTROSURGICAL) IMPLANT
FIBER LASER FLEXIVA 365 (UROLOGICAL SUPPLIES) IMPLANT
FIBER LASER FLEXIVA 550 (UROLOGICAL SUPPLIES) IMPLANT
FIBER LASER TRAC TIP (UROLOGICAL SUPPLIES) IMPLANT
GLOVE BIO SURGEON STRL SZ8 (GLOVE) ×3 IMPLANT
GLOVE SURG SS PI 7.5 STRL IVOR (GLOVE) ×6 IMPLANT
GOWN STRL REUS W/ TWL LRG LVL3 (GOWN DISPOSABLE) ×2 IMPLANT
GOWN STRL REUS W/ TWL XL LVL3 (GOWN DISPOSABLE) ×2 IMPLANT
GOWN STRL REUS W/TWL LRG LVL3 (GOWN DISPOSABLE) ×1
GOWN STRL REUS W/TWL XL LVL3 (GOWN DISPOSABLE) ×1
GUIDEWIRE 0.038 PTFE COATED (WIRE) IMPLANT
GUIDEWIRE ANG ZIPWIRE 038X150 (WIRE) IMPLANT
GUIDEWIRE STR DUAL SENSOR (WIRE) ×3 IMPLANT
HOLDER FOLEY CATH W/STRAP (MISCELLANEOUS) ×3 IMPLANT
IV NS 1000ML (IV SOLUTION) ×3
IV NS 1000ML BAXH (IV SOLUTION) ×6 IMPLANT
IV NS IRRIG 3000ML ARTHROMATIC (IV SOLUTION) IMPLANT
KIT BALLIN UROMAX 15FX10 (LABEL) IMPLANT
KIT BALLN UROMAX 15FX4 (MISCELLANEOUS) IMPLANT
KIT BALLN UROMAX 26 75X4 (MISCELLANEOUS)
KIT ROOM TURNOVER WOR (KITS) ×3 IMPLANT
MANIFOLD NEPTUNE II (INSTRUMENTS) ×3 IMPLANT
PACK CYSTO (CUSTOM PROCEDURE TRAY) ×3 IMPLANT
SET HIGH PRES BAL DIL (LABEL)
SHEATH ACCESS URETERAL 24CM (SHEATH) ×3 IMPLANT
SHEATH ACCESS URETERAL 38CM (SHEATH) IMPLANT
STENT URET 6FRX24 CONTOUR (STENTS) ×3 IMPLANT
SYRINGE IRR TOOMEY STRL 70CC (SYRINGE) ×3 IMPLANT
TUBE CONNECTING 12X1/4 (SUCTIONS) ×3 IMPLANT
WATER STERILE IRR 3000ML UROMA (IV SOLUTION) IMPLANT

## 2015-09-30 NOTE — H&P (Signed)
Kevin Kane is an 82 year old male with a right ureteral stone and bladder calculi.   History of Present Illness      Calculus disease: He has a history of calculus disease and has been under the care of a urologist in [place].  CT scan 07/12/15 - several nonobstructing right renal calculi and an 8 mm stone in the mid right ureter as well as a second stone in the right ureter at the ureterovesical junction. Hounsfield units of 800. Creatinine 1.9.     Interval history: He passed one of the stones into his bladder by CT scan and there was some progression of his remaining right ureteral stone so he was maintained on medical expulsive therapy as he was not having significant symptoms. He has not seen any stones pass. He is not having any significant pain.   Past Medical History Problems  1. History of arthritis (Z87.39) 2. History of asthma (Z87.09) 3. History of hyperlipidemia (Z86.39) 4. History of myocardial infarction (I25.2)  Surgical History Problems  1. History of Angioplasty 2. History of Total Hip Replacement  Current Meds 1. Acyclovir 400 MG Oral Tablet;  Therapy: (Recorded:27Oct2016) to Recorded 2. Aspirin 81 MG TABS;  Therapy: (Recorded:27Oct2016) to Recorded 3. Atorvastatin Calcium 80 MG Oral Tablet;  Therapy: (Recorded:27Oct2016) to Recorded 4. Centrum Silver TABS;  Therapy: (Recorded:27Oct2016) to Recorded 5. Hydroxychloroquine Sulfate 200 MG Oral Tablet;  Therapy: (Recorded:27Oct2016) to Recorded 6. Levothyroxine Sodium TABS;  Therapy: (Recorded:27Oct2016) to Recorded 7. Metoprolol Succinate ER 50 MG Oral Tablet Extended Release 24 Hour;  Therapy: (Recorded:27Oct2016) to Recorded 8. Tamsulosin HCl - 0.4 MG Oral Capsule; TAKE 1 CAPSULE Daily;  Therapy: 27Oct2016 to (Last Rx:27Oct2016)  Requested for: 27Oct2016 Ordered 9. Wal-Fex Allergy 180 MG Oral Tablet;  Therapy: (Recorded:27Oct2016) to Recorded  Allergies Medication  1. ketoconazole  Family  History Problems  1. Family history of Death of family member : Mother, Father   Mother at age 9; strokeFather at age 75; embolism 2. Family history of Embolism : Father 3. Family history of stroke (Z82.3) : Mother  Social History Problems  1. Alcohol use (Z78.9)   2 per week 2. Caffeine use (F15.90)   2 3. Former smoker [phone])   1 ppd for 10 years and quit 55 years ago 35. Married 5. Number of children   2 sons and 2 daughters 49. Retired  Review of Systems Genitourinary, constitutional, skin, eye, otolaryngeal, hematologic/lymphatic, cardiovascular, pulmonary, endocrine, musculoskeletal, gastrointestinal, neurological and psychiatric system(s) were reviewed and pertinent findings if present are noted and are otherwise negative.  Gastrointestinal: diarrhea and constipation.  Respiratory: shortness of breath.  Musculoskeletal: back pain and joint pain.    Vitals Vital Signs  Height: 5 ft 7 in Weight: 270 lb  BMI Calculated: 42.29 BSA Calculated: 2.3 Blood Pressure: 151 / 66 Heart Rate: 75  Physical Exam Constitutional: Well nourished and well developed . No acute distress.   ENT:. The ears and nose are normal in appearance.   Neck: The appearance of the neck is normal and no neck mass is present.   Pulmonary: No respiratory distress and normal respiratory rhythm and effort.   Cardiovascular: Heart rate and rhythm are normal . No peripheral edema.   Abdomen: The abdomen is obese. The abdomen is soft and nontender. No masses are palpated. No CVA tenderness. No hernias are palpable. No hepatosplenomegaly noted.   Lymphatics: The femoral and inguinal nodes are not enlarged or tender.   Skin: Normal skin turgor, no visible rash and  no visible skin lesions.   Neuro/Psych:. Mood and affect are appropriate.   Results/Data Urine  COLOR YELLOW  APPEARANCE CLEAR  SPECIFIC GRAVITY 1.020  pH 5.0  GLUCOSE NEGATIVE  BILIRUBIN NEGATIVE  KETONE NEGATIVE   BLOOD TRACE  PROTEIN NEGATIVE  NITRITE NEGATIVE  LEUKOCYTE ESTERASE NEGATIVE  SQUAMOUS EPITHELIAL/HPF 0-5 HPF WBC 0-5 WBC/HPF RBC 0-2 RBC/HPF BACTERIA NONE SEEN HPF CRYSTALS NONE SEEN HPF CASTS NONE SEEN LPF Yeast NONE SEEN HPF  The following images/tracing/specimen were independently visualized:  Right renal ultrasound and KUB: His right kidney measured 11.1 cm with 8 mm of parenchyma and there was some hydronephrosis with dilation of the proximal ureter. The KUB revealed his stone remains present in the distal right ureter.  The following clinical lab reports were reviewed:  UA: He did not have significant red cells in his urine today.    Assessment   We discussed the management of urinary stones. These options include observation, ureteroscopy, shockwave lithotripsy, and PCNL. We discussed which options are relevant to these particular stones. We discussed the natural history of stones as well as the complications of untreated stones and the impact on quality of life without treatment as well as with each of the above listed treatments. We also discussed the efficacy of each treatment in its ability to clear the stone burden. With any of these management options I discussed the signs and symptoms of infection and the need for emergent treatment should these be experienced. For each option we discussed the ability of each procedure to clear the patient of their stone burden.    For observation I described the risks which include but are not limited to silent renal damage, life-threatening infection, need for emergent surgery, failure to pass stone, and pain.    For ureteroscopy I described the risks which include heart attack, stroke, pulmonary embolus, death, bleeding, infection, damage to contiguous structures, positioning injury, ureteral stricture, ureteral avulsion, ureteral injury, need for ureteral stent, inability to perform ureteroscopy, need for an interval procedure,  inability to clear stone burden, stent discomfort and pain.    For shockwave lithotripsy I described the risks which include arrhythmia, kidney contusion, kidney hemorrhage, need for transfusion, long-term risk of diabetes or hypertension, back discomfort, flank ecchymosis, flank abrasion, inability to break up stone, inability to pass stone fragments, Steinstrasse, infection associated with obstructing stones, need for different surgical procedure and possible need for repeat shockwave lithotripsy.    Because he has a possible stone still in his bladder and right ureteral stone what I have suggested is ureteroscopic management would would allow me to also fragment any stone within the bladder as well.   Plan  He will be scheduled for cystoscopy, possible cystolitholapaxy, right ureteroscopy and laser lithotripsy with stent.

## 2015-09-30 NOTE — Op Note (Signed)
PATIENT:  Kevin Kane  PRE-OPERATIVE DIAGNOSIS: 1. Right ureteral calculus 2. Bladder calculus  POST-OPERATIVE DIAGNOSIS: Same  PROCEDURE: 1. Cystoscopy with right ureteral catheterization 2. Right retrograde pyelogram with interpretation 3. Fluoroscopy use <1 hour 4. Removal of bladder calculus 5. Right ureteroscopy (first stage of a two-stage procedure) 6. Right double-J stent placement  SURGEON:  Claybon Jabs  INDICATION: Kevin Kane is a 82 year old male with a history of calculus disease. He was recently found to have 2 ureteral calculi and passed one of the stones into the bladder but did not urinate the stone out. It was noted to be within the bladder and he also had a stone in his right ureter that failed to progress. He is brought to the operating room today for treatment of his bladder and ureteral calculi.  ANESTHESIA:  General  EBL:  Minimal  DRAINS: 6 French, 24 cm double-J stent in the right ureter (no string)  LOCAL MEDICATIONS USED:  None  SPECIMEN:  Stone given to patient.  Description of procedure: After informed consent the patient was taken to the operating room and placed on the table in a supine position. General anesthesia was then administered. Once fully anesthetized the patient was moved to the dorsal lithotomy position and the genitalia were sterilely prepped and draped in standard fashion. An official timeout was then performed.  The 23 French cystoscope was passed under direct vision down the urethra using a 30 lens. Urethra was noted to be normal. The prostatic urethra was noted to have marked elongation and significant lateral lobe as well as median lobe hypertrophy. No lesions were noted within the prostatic urethra. Upon entering the bladder I noted the ureteral orifices were of normal configuration and position although somewhat difficult to visualize due to the median lobe component. There was 3+ trabeculation of the bladder wall. There were no  lesions within the bladder upon systematic inspection however single stone was noted on the floor of the bladder and photographed.  A 6 French open-ended ureteral catheter was then passed through the cystoscope and I attempted to negotiate this into the right ureteral orifice but it was somewhat difficult due to the angle. I therefore chose a 0.038 inch sensor tip guidewire and passed this through the 6 [place] open-ended ureteral catheter and in to the ureter up to the stone which was visualized on fluoroscopy. I then passed the ureteral catheter over the guidewire and into the distal right ureter and remove the guidewire in preparation for a right retrograde pyelogram.  Using full strength Omnipaque contrast I injected this through the open ended catheter and noted a filling defect in the distal ureter consistent with the stone seen on his preoperative KUB. Only a very small amount of contrast was noted to pass beyond the stone into the ureter above it. I therefore inserted the open-ended catheter over the guidewire again and attempted negotiate the guidewire beyond the stone but was unsuccessful. I then switched to a Glidewire and was able to pass this beyond the stone. In doing this I noted a very tortuous ureter. I was able to straighten it out and then attempted to pass the open-ended catheter beyond the stone but was unsuccessful. I therefore placed the open-ended catheter again over the guidewire and switched this for the sensor wire which I was able to negotiate beyond the stone and into the area the renal pelvis under fluoroscopy. This was left as a safety guidewire and over this the double lumen ureteral  catheter was passed and I attempted to pass a second guidewire through the second lumen and beyond the stone but could not get the guidewire to go beyond the stone. I therefore left the sensor guidewire in place and proceeded with attempted ureteroscopy.  I first used the 6 [place] rigid ureteroscope  and had a difficult time passing this into the ureteral orifice. I therefore removed the rigid ureteroscope and passed the 6 French flexible ureteroscope over the guidewire but was unable to pass it up the ureter to visualize the stone. I removed the ureteroscope and proceeded to remove the bladder stone.  I was able to flush the bladder stone out through the cystoscope as I drained the bladder and retrieved the stone.  I then backloaded the cystoscope over the guidewire and passed the double-J stent over the guidewire into the area of the renal pelvis. Good curl was noted in the area the renal pelvis and in the bladder when I remove the guidewire. I then drained the bladder and removed the cystoscope. I passed a 81 French catheter into the bladder and this was connected to closed system drainage. The patient was awakened and taken to the recovery room in stable and satisfactory condition. He tolerated the procedure well. There were no complications.   PLAN OF CARE: Discharge to home after PACU  PATIENT DISPOSITION:  PACU - hemodynamically stable.

## 2015-09-30 NOTE — Transfer of Care (Signed)
Immediate Anesthesia Transfer of Care Note  Patient: Kevin Kane  Procedure(s) Performed: Procedure(s): RIGHT URETEROSCOPY, RETROGRADE PYELOGRAM, LASER LITHOTRIPSY AND STENT PLACEMENT (Right)  Patient Location: PACU  Anesthesia Type:General  Level of Consciousness: awake, alert , oriented and patient cooperative  Airway & Oxygen Therapy: Patient Spontanous Breathing and Patient connected to nasal cannula oxygen  Post-op Assessment: Report given to RN and Post -op Vital signs reviewed and stable  Post vital signs: Reviewed and stable  Last Vitals:  Filed Vitals:   09/30/15 0953 09/30/15 1151  BP: 146/48 150/58  Pulse: 66 63  Temp: 36.5 C   Resp: 18 8    Complications: No apparent anesthesia complications

## 2015-09-30 NOTE — Discharge Instructions (Signed)

## 2015-09-30 NOTE — Anesthesia Procedure Notes (Signed)
Procedure Name: LMA Insertion Date/Time: 09/30/2015 10:46 AM Performed by: Wanita Chamberlain Pre-anesthesia Checklist: Patient identified, Timeout performed, Emergency Drugs available, Suction available and Patient being monitored Patient Re-evaluated:Patient Re-evaluated prior to inductionOxygen Delivery Method: Circle system utilized Preoxygenation: Pre-oxygenation with 100% oxygen Intubation Type: IV induction Ventilation: Mask ventilation without difficulty LMA: LMA with gastric port inserted LMA Size: 4.0 Number of attempts: 1 Placement Confirmation: breath sounds checked- equal and bilateral and positive ETCO2 Tube secured with: Tape Dental Injury: Teeth and Oropharynx as per pre-operative assessment

## 2015-09-30 NOTE — Anesthesia Preprocedure Evaluation (Addendum)
Anesthesia Evaluation  Patient identified by MRN, date of birth, ID band Patient awake    Airway Mallampati: III  TM Distance: >3 FB     Dental  (+) Teeth Intact, Dental Advisory Given, Implants,    Pulmonary asthma , COPD,  COPD inhaler, former smoker,    Pulmonary exam normal breath sounds clear to auscultation       Cardiovascular Exercise Tolerance: Poor hypertension, Pt. on medications + CAD and + Cardiac Stents  + dysrhythmias  Rhythm:Regular Rate:Normal     Neuro/Psych    GI/Hepatic GERD  Medicated,  Endo/Other  Hypothyroidism   Renal/GU Renal calculus     Musculoskeletal  (+) Arthritis , Osteoarthritis,  Morbid Obesity   Abdominal   Peds  Hematology   Anesthesia Other Findings   Reproductive/Obstetrics                         Lab Results  Component Value Date   WBC 14.6 07/11/2015   HGB 11.9* 09/30/2015   HCT 35.0* 09/30/2015   PLT 505* 07/11/2015   Lab Results  Component Value Date   CREATININE 1.9* 07/11/2015   BUN 30* 07/11/2015   NA 147* 09/30/2015   K 4.8 09/30/2015    Anesthesia Physical Anesthesia Plan  ASA: III  Anesthesia Plan: General   Post-op Pain Management:    Induction: Intravenous  Airway Management Planned: LMA  Additional Equipment:   Intra-op Plan:   Post-operative Plan: Extubation in OR  Informed Consent: I have reviewed the patients History and Physical, chart, labs and discussed the procedure including the risks, benefits and alternatives for the proposed anesthesia with the patient or authorized representative who has indicated his/her understanding and acceptance.   Dental advisory given  Plan Discussed with: CRNA  Anesthesia Plan Comments:         Anesthesia Quick Evaluation

## 2015-10-01 ENCOUNTER — Other Ambulatory Visit: Payer: Self-pay | Admitting: Urology

## 2015-10-01 ENCOUNTER — Encounter (HOSPITAL_BASED_OUTPATIENT_CLINIC_OR_DEPARTMENT_OTHER): Payer: Self-pay | Admitting: Urology

## 2015-10-02 NOTE — Anesthesia Postprocedure Evaluation (Signed)
Anesthesia Post Note  Patient: Kevin Kane  Procedure(s) Performed: Procedure(s) (LRB): RIGHT URETEROSCOPY, RETROGRADE PYELOGRAM, LASER LITHOTRIPSY AND STENT PLACEMENT (Right)  Patient location during evaluation: PACU Anesthesia Type: General Level of consciousness: awake and alert Pain management: pain level controlled Vital Signs Assessment: post-procedure vital signs reviewed and stable Respiratory status: spontaneous breathing Cardiovascular status: blood pressure returned to baseline Anesthetic complications: no    Last Vitals:  Filed Vitals:   09/30/15 1230 09/30/15 1530  BP: 123/53 154/54  Pulse: 54 56  Temp:  36.4 C  Resp: 11 12    Last Pain:  Filed Vitals:   10/01/15 1543  PainSc: 3                  Tiajuana Amass

## 2015-10-29 ENCOUNTER — Encounter (HOSPITAL_BASED_OUTPATIENT_CLINIC_OR_DEPARTMENT_OTHER): Payer: Self-pay | Admitting: *Deleted

## 2015-10-29 NOTE — Progress Notes (Signed)
Pt  States no change in hx or meds since last visit.  Instructed pt to be NPO p mn 02/12 x am meds w sip of water. Pt also to use albuterol inhaler pta. To [facility] 2/13 @ 0915.  Needs KUB and istat on arrival.

## 2015-11-04 ENCOUNTER — Ambulatory Visit (HOSPITAL_BASED_OUTPATIENT_CLINIC_OR_DEPARTMENT_OTHER)
Admission: RE | Admit: 2015-11-04 | Discharge: 2015-11-04 | Disposition: A | Discharge status: Home / Self Care | Payer: Medicare Other | Source: Ambulatory Visit | Attending: Urology | Admitting: Urology

## 2015-11-04 ENCOUNTER — Ambulatory Visit (HOSPITAL_COMMUNITY): Payer: Medicare Other

## 2015-11-04 ENCOUNTER — Ambulatory Visit (HOSPITAL_BASED_OUTPATIENT_CLINIC_OR_DEPARTMENT_OTHER): Payer: Medicare Other | Admitting: Anesthesiology

## 2015-11-04 ENCOUNTER — Encounter (HOSPITAL_BASED_OUTPATIENT_CLINIC_OR_DEPARTMENT_OTHER)
Admission: RE | Disposition: A | Discharge status: Home / Self Care | Payer: Self-pay | Source: Ambulatory Visit | Attending: Urology

## 2015-11-04 ENCOUNTER — Encounter (HOSPITAL_BASED_OUTPATIENT_CLINIC_OR_DEPARTMENT_OTHER): Payer: Self-pay | Admitting: Anesthesiology

## 2015-11-04 DIAGNOSIS — I252 Old myocardial infarction: Secondary | ICD-10-CM | POA: Diagnosis not present

## 2015-11-04 DIAGNOSIS — Z7982 Long term (current) use of aspirin: Secondary | ICD-10-CM | POA: Insufficient documentation

## 2015-11-04 DIAGNOSIS — N132 Hydronephrosis with renal and ureteral calculous obstruction: Secondary | ICD-10-CM | POA: Diagnosis not present

## 2015-11-04 DIAGNOSIS — Z79899 Other long term (current) drug therapy: Secondary | ICD-10-CM | POA: Insufficient documentation

## 2015-11-04 DIAGNOSIS — Z955 Presence of coronary angioplasty implant and graft: Secondary | ICD-10-CM | POA: Insufficient documentation

## 2015-11-04 DIAGNOSIS — E039 Hypothyroidism, unspecified: Secondary | ICD-10-CM | POA: Diagnosis not present

## 2015-11-04 DIAGNOSIS — I2510 Atherosclerotic heart disease of native coronary artery without angina pectoris: Secondary | ICD-10-CM | POA: Insufficient documentation

## 2015-11-04 DIAGNOSIS — Z96659 Presence of unspecified artificial knee joint: Secondary | ICD-10-CM | POA: Diagnosis not present

## 2015-11-04 DIAGNOSIS — E785 Hyperlipidemia, unspecified: Secondary | ICD-10-CM | POA: Diagnosis not present

## 2015-11-04 DIAGNOSIS — K219 Gastro-esophageal reflux disease without esophagitis: Secondary | ICD-10-CM | POA: Insufficient documentation

## 2015-11-04 DIAGNOSIS — I10 Essential (primary) hypertension: Secondary | ICD-10-CM | POA: Insufficient documentation

## 2015-11-04 DIAGNOSIS — Z87891 Personal history of nicotine dependence: Secondary | ICD-10-CM | POA: Insufficient documentation

## 2015-11-04 DIAGNOSIS — J449 Chronic obstructive pulmonary disease, unspecified: Secondary | ICD-10-CM | POA: Diagnosis not present

## 2015-11-04 DIAGNOSIS — M1990 Unspecified osteoarthritis, unspecified site: Secondary | ICD-10-CM | POA: Insufficient documentation

## 2015-11-04 DIAGNOSIS — J45909 Unspecified asthma, uncomplicated: Secondary | ICD-10-CM | POA: Diagnosis not present

## 2015-11-04 DIAGNOSIS — N189 Chronic kidney disease, unspecified: Secondary | ICD-10-CM | POA: Diagnosis not present

## 2015-11-04 DIAGNOSIS — I129 Hypertensive chronic kidney disease with stage 1 through stage 4 chronic kidney disease, or unspecified chronic kidney disease: Secondary | ICD-10-CM | POA: Diagnosis not present

## 2015-11-04 DIAGNOSIS — N201 Calculus of ureter: Secondary | ICD-10-CM

## 2015-11-04 HISTORY — PX: CYSTOSCOPY/URETEROSCOPY/HOLMIUM LASER/STENT PLACEMENT: SHX6546

## 2015-11-04 LAB — POCT I-STAT, CHEM 8
BUN: 29 mg/dL — ABNORMAL HIGH (ref 6–20)
Calcium, Ion: 1.3 mmol/L (ref 1.13–1.30)
Chloride: 108 mmol/L (ref 101–111)
Creatinine, Ser: 1.5 mg/dL — ABNORMAL HIGH (ref 0.61–1.24)
Glucose, Bld: 94 mg/dL (ref 65–99)
HCT: 33 % — ABNORMAL LOW (ref 39.0–52.0)
Hemoglobin: 11.2 g/dL — ABNORMAL LOW (ref 13.0–17.0)
Potassium: 4.5 mmol/L (ref 3.5–5.1)
Sodium: 145 mmol/L (ref 135–145)
TCO2: 26 mmol/L (ref 0–100)

## 2015-11-04 LAB — GLUCOSE, CAPILLARY: Glucose-Capillary: 93 mg/dL (ref 65–99)

## 2015-11-04 SURGERY — CYSTOSCOPY/URETEROSCOPY/HOLMIUM LASER/STENT PLACEMENT
Anesthesia: General | Site: Ureter | Laterality: Right

## 2015-11-04 MED ORDER — LIDOCAINE HCL (CARDIAC) 20 MG/ML IV SOLN
INTRAVENOUS | Status: DC | PRN
  Administered 2015-11-04: 80 mg via INTRAVENOUS

## 2015-11-04 MED ORDER — LACTATED RINGERS IV SOLN
INTRAVENOUS | Status: DC
  Administered 2015-11-04: 15:00:00 via INTRAVENOUS
  Filled 2015-11-04: qty 1000

## 2015-11-04 MED ORDER — DEXAMETHASONE SODIUM PHOSPHATE 10 MG/ML IJ SOLN
INTRAMUSCULAR | Status: DC | PRN
  Administered 2015-11-04: 5 mg via INTRAVENOUS

## 2015-11-04 MED ORDER — CIPROFLOXACIN IN D5W 200 MG/100ML IV SOLN
INTRAVENOUS | Status: AC
  Filled 2015-11-04: qty 100

## 2015-11-04 MED ORDER — PHENAZOPYRIDINE HCL 200 MG PO TABS
200.0000 mg | ORAL_TABLET | Freq: Once | ORAL | Status: AC
  Administered 2015-11-04: 200 mg via ORAL
  Filled 2015-11-04: qty 1

## 2015-11-04 MED ORDER — ONDANSETRON HCL 4 MG/2ML IJ SOLN
INTRAMUSCULAR | Status: AC
  Filled 2015-11-04: qty 2

## 2015-11-04 MED ORDER — FENTANYL CITRATE (PF) 100 MCG/2ML IJ SOLN
25.0000 ug | INTRAMUSCULAR | Status: DC | PRN
  Filled 2015-11-04: qty 1

## 2015-11-04 MED ORDER — LACTATED RINGERS IV SOLN
INTRAVENOUS | Status: DC
  Administered 2015-11-04: 11:00:00 via INTRAVENOUS
  Filled 2015-11-04: qty 1000

## 2015-11-04 MED ORDER — HYDROCODONE-ACETAMINOPHEN 10-325 MG PO TABS: 1.0000 | ORAL_TABLET | ORAL | Status: DC | PRN

## 2015-11-04 MED ORDER — PHENAZOPYRIDINE HCL 200 MG PO TABS: 200.0000 mg | ORAL_TABLET | Freq: Three times a day (TID) | ORAL | Status: DC | PRN

## 2015-11-04 MED ORDER — SODIUM CHLORIDE 0.9 % IR SOLN
Status: DC | PRN
  Administered 2015-11-04: 1000 mL
  Administered 2015-11-04 (×3): 3000 mL

## 2015-11-04 MED ORDER — PROPOFOL 10 MG/ML IV BOLUS
INTRAVENOUS | Status: AC
  Filled 2015-11-04: qty 40

## 2015-11-04 MED ORDER — FENTANYL CITRATE (PF) 100 MCG/2ML IJ SOLN
INTRAMUSCULAR | Status: DC | PRN
  Administered 2015-11-04 (×4): 25 ug via INTRAVENOUS

## 2015-11-04 MED ORDER — IOHEXOL 350 MG/ML SOLN
INTRAVENOUS | Status: DC | PRN
  Administered 2015-11-04: 12 mL

## 2015-11-04 MED ORDER — LIDOCAINE HCL (CARDIAC) 20 MG/ML IV SOLN
INTRAVENOUS | Status: AC
  Filled 2015-11-04: qty 5

## 2015-11-04 MED ORDER — PHENAZOPYRIDINE HCL 100 MG PO TABS
ORAL_TABLET | ORAL | Status: AC
  Filled 2015-11-04: qty 2

## 2015-11-04 MED ORDER — DEXAMETHASONE SODIUM PHOSPHATE 10 MG/ML IJ SOLN
INTRAMUSCULAR | Status: AC
  Filled 2015-11-04: qty 1

## 2015-11-04 MED ORDER — PROPOFOL 10 MG/ML IV BOLUS
INTRAVENOUS | Status: DC | PRN
  Administered 2015-11-04: 160 mg via INTRAVENOUS

## 2015-11-04 MED ORDER — ONDANSETRON HCL 4 MG/2ML IJ SOLN
INTRAMUSCULAR | Status: DC | PRN
  Administered 2015-11-04: 4 mg via INTRAVENOUS

## 2015-11-04 MED ORDER — CIPROFLOXACIN IN D5W 200 MG/100ML IV SOLN
200.0000 mg | INTRAVENOUS | Status: AC
  Administered 2015-11-04: 200 mg via INTRAVENOUS
  Filled 2015-11-04: qty 100

## 2015-11-04 MED ORDER — FENTANYL CITRATE (PF) 100 MCG/2ML IJ SOLN
INTRAMUSCULAR | Status: AC
  Filled 2015-11-04: qty 2

## 2015-11-04 SURGICAL SUPPLY — 48 items
ADAPTER CATH URET PLST 4-6FR (CATHETERS) IMPLANT
BAG DRAIN URO-CYSTO SKYTR STRL (DRAIN) ×2 IMPLANT
BAG URINE LEG 500ML (DRAIN) ×2 IMPLANT
BASKET LASER NITINOL 1.9FR (BASKET) IMPLANT
BASKET STNLS GEMINI 4WIRE 3FR (BASKET) IMPLANT
BASKET ZERO TIP NITINOL 2.4FR (BASKET) IMPLANT
BLADE SURG 15 STRL LF DISP TIS (BLADE) IMPLANT
BLADE SURG 15 STRL SS (BLADE)
CATH FOLEY 2WAY SLVR  5CC 20FR (CATHETERS) ×1
CATH FOLEY 2WAY SLVR 5CC 20FR (CATHETERS) ×1 IMPLANT
CATH INTERMIT  6FR 70CM (CATHETERS) IMPLANT
CATH URET 5FR 28IN CONE TIP (BALLOONS)
CATH URET 5FR 70CM CONE TIP (BALLOONS) IMPLANT
CATH URET DUAL LUMEN 6-10FR 50 (CATHETERS) ×2 IMPLANT
CLOTH BEACON ORANGE TIMEOUT ST (SAFETY) ×2 IMPLANT
ELECT REM PT RETURN 9FT ADLT (ELECTROSURGICAL)
ELECTRODE REM PT RTRN 9FT ADLT (ELECTROSURGICAL) IMPLANT
FIBER LASER FLEXIVA 365 (UROLOGICAL SUPPLIES) IMPLANT
FIBER LASER FLEXIVA 550 (UROLOGICAL SUPPLIES) IMPLANT
FIBER LASER TRAC TIP (UROLOGICAL SUPPLIES) ×2 IMPLANT
GLOVE BIO SURGEON STRL SZ 6.5 (GLOVE) ×2 IMPLANT
GLOVE BIO SURGEON STRL SZ8 (GLOVE) ×2 IMPLANT
GLOVE INDICATOR 6.5 STRL GRN (GLOVE) ×2 IMPLANT
GLOVE SURG SS PI 7.5 STRL IVOR (GLOVE) ×4 IMPLANT
GOWN STRL REUS W/ TWL XL LVL3 (GOWN DISPOSABLE) ×1 IMPLANT
GOWN STRL REUS W/TWL LRG LVL3 (GOWN DISPOSABLE) ×2 IMPLANT
GOWN STRL REUS W/TWL XL LVL3 (GOWN DISPOSABLE) ×1
GUIDEWIRE 0.038 PTFE COATED (WIRE) IMPLANT
GUIDEWIRE ANG ZIPWIRE 038X150 (WIRE) IMPLANT
GUIDEWIRE STR DUAL SENSOR (WIRE) ×4 IMPLANT
HOLDER FOLEY CATH W/STRAP (MISCELLANEOUS) ×2 IMPLANT
IV NS 1000ML (IV SOLUTION) ×1
IV NS 1000ML BAXH (IV SOLUTION) ×1 IMPLANT
IV NS IRRIG 3000ML ARTHROMATIC (IV SOLUTION) ×6 IMPLANT
KIT BALLIN UROMAX 15FX10 (LABEL) IMPLANT
KIT BALLN UROMAX 15FX4 (MISCELLANEOUS) IMPLANT
KIT BALLN UROMAX 26 75X4 (MISCELLANEOUS)
KIT ROOM TURNOVER WOR (KITS) ×2 IMPLANT
MANIFOLD NEPTUNE II (INSTRUMENTS) ×2 IMPLANT
PACK CYSTO (CUSTOM PROCEDURE TRAY) ×2 IMPLANT
RETRIEVER STENT NSNARE (INSTRUMENTS) ×2 IMPLANT
SET HIGH PRES BAL DIL (LABEL)
SHEATH ACCESS URETERAL 24CM (SHEATH) ×2 IMPLANT
SHEATH ACCESS URETERAL 38CM (SHEATH) IMPLANT
STENT POLARIS LOOP 6FR X 26 CM (STENTS) ×2 IMPLANT
TUBE CONNECTING 12X1/4 (SUCTIONS) ×2 IMPLANT
TUBE FEEDING 8FR 16IN STR KANG (MISCELLANEOUS) ×2 IMPLANT
WATER STERILE IRR 3000ML UROMA (IV SOLUTION) IMPLANT

## 2015-11-04 NOTE — Anesthesia Procedure Notes (Addendum)
Procedure Name: LMA Insertion Date/Time: 11/04/2015 11:21 AM Performed by: Justice Rocher Pre-anesthesia Checklist: Patient identified, Emergency Drugs available, Suction available and Patient being monitored Patient Re-evaluated:Patient Re-evaluated prior to inductionOxygen Delivery Method: Circle System Utilized Preoxygenation: Pre-oxygenation with 100% oxygen Intubation Type: IV induction Ventilation: Mask ventilation without difficulty LMA: LMA inserted LMA Size: 5.0 Number of attempts: 1 Airway Equipment and Method: Bite block Placement Confirmation: positive ETCO2 Tube secured with: Tape Dental Injury: Teeth and Oropharynx as per pre-operative assessment  Comments: Pt shoulder, upper body supported with grey wedge foam wedge, pre 02, smooth induction and LMA placement

## 2015-11-04 NOTE — Op Note (Signed)
PATIENT:  Kevin Kane  PRE-OPERATIVE DIAGNOSIS: 1.  right Ureteral calculus  2. Right ureteral stent  POST-OPERATIVE DIAGNOSIS: 1. Impacted right ureteral calculus   2. Migrated right ureteral stent  PROCEDURE:  1. Cystoscopy with right retrograde pyelogram including interpretation.  2.  Fluoroscopy greater than 1 hour 3. Difficult stent removal 4. Right ureteroscopy with laser lithotripsy with stent replacement   SURGEON: Claybon Jabs, MD  INDICATION: Kevin Kane is an 82 year old male who had right ureteral calculi that I initially treated but was only able to get some of the stone. He had a very large stone that was impacted and I was successful in getting a stent past the stone. He returns today for the second part of a planned 2 part procedure to treat his right ureteral stone.   ANESTHESIA:  General  EBL:   50 mL  Drains: 1. 20 French Foley catheter 2. 6 French, 26 cm Polaris stent in the right ureter (with string)   SPECIMEN:  None  DCRIPTION OF PROCEDURE: The patient was taken to the major OR and placed on the table. General anesthesia was administered and then the patient was moved to the dorsal lithotomy position. The genitalia was sterilely prepped and draped. An official timeout was performed.  Initially the 22 French cystoscope with 30 lens was passed under direct vision into the bladder. The bladder was then fully inspected. It was noted be free of any tumors, stones or inflammatory lesions. Ureteral orifices were of normal configuration and position. the preop KUB revealed the stent appeared to have migrated and this was confirmed cystoscopically as no stent was noted to be exiting the right ureteral orifice.  I passed the 6 French rigid ureteroscope into the right ureter and was able to visualize the stent but was unable to grasp it. While manipulating the stent it eventually migrated further down the ureter and eventually was located at the ureteral orifice so I  removed the ureteroscope and reinserted the cystoscope. With the cystoscope I was able to grasp the stent with the alligator forceps and withdrawn to the end of the urethral meatus.  A 0.038 inch floppy-tipped guidewire was then passed through the stent and into the area of the renal pelvis. This was left in place and I then proceeded to perform a right retrograde pyelogram. This was performed by passing a 6 [place] open-ended ureteral catheter through the cystoscope and alongside the guidewire into the right distal ureter.  A retrograde pyelogram was performed by injecting full-strength contrast up the right ureter under direct fluoroscopic control. Contrast outlined the distal aspect of the stone but I was not able to get any contrast whatsoever to pass beyond the stone confirming the impacted nature of the stone. I made multiple attempts both with the ureteroscope and with the cystoscope and an open ended catheter to pass a second guidewire passed the stone but was unsuccessful. Eventually I was able to pass the dual-lumen ureteral access sheath over the guidewire and was able to negotiate a second guidewire passed the stone and into the area of the right renal pelvis noted by fluoroscopy.   A 6 French flexible cystoscope was initially passed over the guidewire and up to the stone. The guidewire was removed and the  stone was identified and I felt it was too large to extract and therefore elected to proceed with laser lithotripsy. The 200  holmium laser fiber was used to fragment the stone. I had a very difficult time visualizing  the entire stone and therefore switched to the 6 French rigid ureteroscope and was able to then easily apply the laser to the stone and fragmented. As I fragmented the stone I was eventually able to advance the scope into the ureter above where the stone had been impacted. Because of the very difficult time I had getting up the ureter to the stone I did not want to try to extract  all of the fragments and therefore elected to fragment the stone into small pieces that would be passable. I used the holmium laser to break the stone into 1 mm or less fragments in the ureter above the area where the stone was impacted which allowed me room to work. I then systematically would draw the ureteroscope back down the ureter and push the next fragment up and fragmented and did so multiple times until all of the fragments had been completely pulverized and there were no significant fragments remaining. It was felt that all the stone would likely pass due to the small size that I was able to break the stone into. This was performed with a safety guidewire present the whole time. I therefore removed the ureteroscope.    I then backloaded the cystoscope over the guidewire and passed the stent over the guidewire into the area of the renal pelvis. As the guidewire was removed good curl was noted in the renal pelvis. The bladder was drained and the cystoscope was then removed.  I inserted a 20 French Foley catheter and irrigated this. The irrigant returned light pink. The catheter was then connected to closed system drainage and the patient was awakened and taken to the recovery room. The patient tolerated the procedure well no intraoperative complications.  PLAN OF CARE: Discharge to home after PACU  PATIENT DISPOSITION:  PACU - hemodynamically stable.

## 2015-11-04 NOTE — H&P (Signed)
Kevin Kane is an 82 year old male seen in follow-up for right ureteral and bladder calculi.   History of Present Illness      Calculus disease: He has a history of calculus disease and has been under the care of a urologist in [place].  CT scan 07/12/15 - several nonobstructing right renal calculi and an 8 mm stone in the mid right ureter as well as a second stone in the right ureter at the ureterovesical junction. Hounsfield units of 800. Creatinine 1.9.     Interval history: He passed one of the stones into his bladder by CT scan and there was some progression of his remaining right ureteral stone so he was maintained on medical expulsive therapy as he was not having significant symptoms. He has not seen any stones pass. He is not having any significant pain.   Past Medical History Problems  1. History of arthritis (Z87.39) 2. History of asthma (Z87.09) 3. History of hyperlipidemia (Z86.39) 4. History of myocardial infarction (I25.2)  Surgical History Problems  1. History of Angioplasty 2. History of Total Hip Replacement  Current Meds 1. Acyclovir 400 MG Oral Tablet;  Therapy: (Recorded:27Oct2016) to Recorded 2. Aspirin 81 MG TABS;  Therapy: (Recorded:27Oct2016) to Recorded 3. Atorvastatin Calcium 80 MG Oral Tablet;  Therapy: (Recorded:27Oct2016) to Recorded 4. Centrum Silver TABS;  Therapy: (Recorded:27Oct2016) to Recorded 5. Hydroxychloroquine Sulfate 200 MG Oral Tablet;  Therapy: (Recorded:27Oct2016) to Recorded 6. Levothyroxine Sodium TABS;  Therapy: (Recorded:27Oct2016) to Recorded 7. Metoprolol Succinate ER 50 MG Oral Tablet Extended Release 24 Hour;  Therapy: (Recorded:27Oct2016) to Recorded 8. Tamsulosin HCl - 0.4 MG Oral Capsule; TAKE 1 CAPSULE Daily;  Therapy: 27Oct2016 to (Last Rx:27Oct2016)  Requested for: 27Oct2016 Ordered 9. Wal-Fex Allergy 180 MG Oral Tablet;  Therapy: (Recorded:27Oct2016) to Recorded  Allergies Medication  1.  ketoconazole  Family History Problems  1. Family history of Death of family member : Mother, Father   Mother at age 42; strokeFather at age 6; embolism 2. Family history of Embolism : Father 3. Family history of stroke (Z82.3) : Mother  Social History Problems  1. Alcohol use (Z78.9)   2 per week 2. Caffeine use (F15.90)   2 3. Former smoker [phone])   1 ppd for 10 years and quit 55 years ago 65. Married 5. Number of children   2 sons and 2 daughters 34. Retired    Engineer, site Vital Signs  Blood Pressure: 142 / 56 Heart Rate: 68  Review of Systems Genitourinary, constitutional, skin, eye, otolaryngeal, hematologic/lymphatic, cardiovascular, pulmonary, endocrine, musculoskeletal, gastrointestinal, neurological and psychiatric system(s) were reviewed and pertinent findings if present are noted and are otherwise negative.  Gastrointestinal: diarrhea and constipation.  Respiratory: shortness of breath.  Musculoskeletal: back pain and joint pain.     Physical Exam Constitutional: Well nourished and well developed . No acute distress.   ENT:. The ears and nose are normal in appearance.   Neck: The appearance of the neck is normal and no neck mass is present.   Pulmonary: No respiratory distress and normal respiratory rhythm and effort.   Cardiovascular: Heart rate and rhythm are normal . No peripheral edema.   Abdomen: The abdomen is obese. The abdomen is soft and nontender. No masses are palpated. No CVA tenderness. No hernias are palpable. No hepatosplenomegaly noted.   Lymphatics: The femoral and inguinal nodes are not enlarged or tender.   Skin: Normal skin turgor, no visible rash and no visible skin lesions.   Neuro/Psych:. Mood and affect are  appropriate.    Results/Data Urine  COLOR YELLOW  APPEARANCE CLEAR  SPECIFIC GRAVITY 1.020  pH 5.0  GLUCOSE NEGATIVE  BILIRUBIN NEGATIVE  KETONE NEGATIVE  BLOOD TRACE  PROTEIN NEGATIVE  NITRITE NEGATIVE   LEUKOCYTE ESTERASE NEGATIVE  SQUAMOUS EPITHELIAL/HPF 0-5 HPF WBC 0-5 WBC/HPF RBC 0-2 RBC/HPF BACTERIA NONE SEEN HPF CRYSTALS NONE SEEN HPF CASTS NONE SEEN LPF Yeast NONE SEEN HPF  Right renal ultrasound and KUB: His right kidney measured 11.1 cm with 8 mm of parenchyma and there was some hydronephrosis with dilation of the proximal ureter. The KUB revealed his stone remains present in the distal right ureter.     Assessment   We discussed the management of urinary stones. These options include observation, ureteroscopy, shockwave lithotripsy, and PCNL. We discussed which options are relevant to these particular stones. We discussed the natural history of stones as well as the complications of untreated stones and the impact on quality of life without treatment as well as with each of the above listed treatments. We also discussed the efficacy of each treatment in its ability to clear the stone burden. With any of these management options I discussed the signs and symptoms of infection and the need for emergent treatment should these be experienced. For each option we discussed the ability of each procedure to clear the patient of their stone burden.    For observation I described the risks which include but are not limited to silent renal damage, life-threatening infection, need for emergent surgery, failure to pass stone, and pain.    For ureteroscopy I described the risks which include heart attack, stroke, pulmonary embolus, death, bleeding, infection, damage to contiguous structures, positioning injury, ureteral stricture, ureteral avulsion, ureteral injury, need for ureteral stent, inability to perform ureteroscopy, need for an interval procedure, inability to clear stone burden, stent discomfort and pain.    For shockwave lithotripsy I described the risks which include arrhythmia, kidney contusion, kidney hemorrhage, need for transfusion, long-term risk of diabetes or hypertension,  back discomfort, flank ecchymosis, flank abrasion, inability to break up stone, inability to pass stone fragments, Steinstrasse, infection associated with obstructing stones, need for different surgical procedure and possible need for repeat shockwave lithotripsy.    Because he has a possible stone still in his bladder and right ureteral stone what I have suggested is ureteroscopic management would would allow me to also fragment any stone within the bladder as well.   Plan  He is scheduled for cystoscopy, possible cystolitholapaxy, right ureteroscopy and laser lithotripsy with stent.

## 2015-11-04 NOTE — Discharge Instructions (Signed)
Post stone removal/stent placement surgery instructions   Definitions:  Ureter: The duct that transports urine from the kidney to the bladder. Stent: A plastic hollow tube that is placed into the ureter, from the kidney to the bladder to prevent the ureter from swelling shut.  General instructions:  Despite the fact that no skin incisions were used, the area around the ureter and bladder is raw and irritated. The stent is a foreign body which will further irritate the bladder wall. This irritation is manifested by increased frequency of urination, both day and night, and by an increase in the urge to urinate. In some, the urge to urinate is present almost always. Sometimes the urge is strong enough that you may not be able to stop your self from urinating. The only real cure is to remove the stent and then give time for the bladder wall to heal which can't be done until the danger of the ureter swelling shut has passed. (This varies from 2-21 days).  You may see some blood in your urine while the stent is in place and a few days afterward. Do not be alarmed, even if the urine is clear for a while. Get off your feet and drink lots of fluids until clearing occurs. If you start to pass clots or don't improve, call us.  If you have a string coming from your urethra:  The stent string is attached to your ureteral stent.  Do not pull on thisIf you have a string coming from your urethra:  The stent string is attached to your ureteral stent.  Do not pull on this.  Diet:  You may return to your normal diet immediately. Because of the raw surface of your bladder, alcohol, spicy foods, foods high in acid and drinks with caffeine may cause irritation or frequency and should be used in moderation. To keep your urine flowing freely and avoid constipation, drink plenty of fluids during the day (8-10 glasses). Tip: Avoid cranberry juice because it is very acidic.  Activity:  Your physical activity doesn't need  to be restricted. However, if you are very active, you may see some blood in the urine. We suggest that you reduce your activity under the circumstances until the bleeding has stopped.  Bowels:  It is important to keep your bowels regular during the postoperative period. Straining with bowel movements can cause bleeding. A bowel movement every other day is reasonable. Use a mild laxative if needed, such as milk of magnesia 2-3 tablespoons, or 2 Dulcolax tablets. Call if you continue to have problems. If you had been taking narcotics for pain, before, during or after your surgery, you may be constipated. Take a laxative if necessary.     Medication:  You should resume your pre-surgery medications unless told not to. DO NOT RESUME YOUR ASPIRIN, or any other medicines like ibuprofen, motrin, excedrin, advil, aleve, vitamin E, fish oil as these can all cause bleeding x 7 days. In addition you may be given an antibiotic to prevent or treat infection. Antibiotics are not always necessary. All medication should be taken as prescribed until the bottles are finished unless you are having an unusual reaction to one of the drugs.  Problems you should report to [place]:  a. Fever greater than 101F. b. Heavy bleeding, or clots (see notes above about blood in urine). c. Inability to urinate. d. Drug reactions (hives, rash, nausea, vomiting, diarrhea). e. Severe burning or pain with urination that is not improving.  Followup:  You will need a followup appointment to monitor your progress in most cases. Please call the office for this appointment when you get home if your appointment has not already been scheduled. Usually the first appointment will be about 5-14 days after your surgery and if you have a stent in place it will likely be removed at that time.  Post Anesthesia Home Care Instructions  Activity: Get plenty of rest for the remainder of the day. A responsible adult should stay with you for 24  hours following the procedure.  For the next 24 hours, DO NOT: -Drive a car -Paediatric nurse -Drink alcoholic beverages -Take any medication unless instructed by your physician -Make any legal decisions or sign important papers.  Meals: Start with liquid foods such as gelatin or soup. Progress to regular foods as tolerated. Avoid greasy, spicy, heavy foods. If nausea and/or vomiting occur, drink only clear liquids until the nausea and/or vomiting subsides. Call your physician if vomiting continues.  Special Instructions/Symptoms: Your throat may feel dry or sore from the anesthesia or the breathing tube placed in your throat during surgery. If this causes discomfort, gargle with warm salt water. The discomfort should disappear within 24 hours.  If you had a scopolamine patch placed behind your ear for the management of post- operative nausea and/or vomiting:  1. The medication in the patch is effective for 72 hours, after which it should be removed.  Wrap patch in a tissue and discard in the trash. Wash hands thoroughly with soap and water. 2. You may remove the patch earlier than 72 hours if you experience unpleasant side effects which may include dry mouth, dizziness or visual disturbances. 3. Avoid touching the patch. Wash your hands with soap and water after contact with the patch.   Foley Catheter Care, Adult A Foley catheter is a soft, flexible tube that is placed into the bladder to drain urine. A Foley catheter may be inserted if:  You leak urine or are not able to control when you urinate (urinary incontinence).  You are not able to urinate when you need to (urinary retention).  You had prostate surgery or surgery on the genitals.  You have certain medical conditions, such as multiple sclerosis, dementia, or a spinal cord injury. If you are going home with a Foley catheter in place, follow the instructions below. TAKING CARE OF THE CATHETER 1. Wash your hands with soap and  water. 2. Using mild soap and warm water on a clean washcloth:  Clean the area on your body closest to the catheter insertion site using a circular motion, moving away from the catheter. Never wipe toward the catheter because this could sweep bacteria up into the urethra and cause infection.  Remove all traces of soap. Pat the area dry with a clean towel. For males, reposition the foreskin. 3. Attach the catheter to your leg so there is no tension on the catheter. Use adhesive tape or a leg strap. If you are using adhesive tape, remove any sticky residue left behind by the previous tape you used. 4. Keep the drainage bag below the level of the bladder, but keep it off the floor. 5. Check throughout the day to be sure the catheter is working and urine is draining freely. Make sure the tubing does not become kinked. 6. Do not pull on the catheter or try to remove it. Pulling could damage internal tissues. TAKING CARE OF THE DRAINAGE BAGS You will be given two drainage bags to take  home. One is a large overnight drainage bag, and the other is a smaller leg bag that fits underneath clothing. You may wear the overnight bag at any time, but you should never wear the smaller leg bag at night. Follow the instructions below for how to empty, change, and clean your drainage bags. Emptying the Drainage Bag You must empty your drainage bag when it is  - full or at least 2-3 times a day. 1. Wash your hands with soap and water. 2. Keep the drainage bag below your hips, below the level of your bladder. This stops urine from going back into the tubing and into your bladder. 3. Hold the dirty bag over the toilet or a clean container. 4. Open the pour spout at the bottom of the bag and empty the urine into the toilet or container. Do not let the pour spout touch the toilet, container, or any other surface. Doing so can place bacteria on the bag, which can cause an infection. 5. Clean the pour spout with a gauze pad  or cotton ball that has rubbing alcohol on it. 6. Close the pour spout. 7. Attach the bag to your leg with adhesive tape or a leg strap. 8. Wash your hands well. Changing the Drainage Bag Change your drainage bag once a month or sooner if it starts to smell bad or look dirty. Below are steps to follow when changing the drainage bag. 1. Wash your hands with soap and water. 2. Pinch off the rubber catheter so that urine does not spill out. 3. Disconnect the catheter tube from the drainage tube at the connection valve. Do not let the tubes touch any surface. 4. Clean the end of the catheter tube with an alcohol wipe. Use a different alcohol wipe to clean the end of the drainage tube. 5. Connect the catheter tube to the drainage tube of the clean drainage bag. 6. Attach the new bag to the leg with adhesive tape or a leg strap. Avoid attaching the new bag too tightly. 7. Wash your hands well. PREVENTING INFECTION  Wash your hands before and after handling your catheter.  Take showers daily and wash the area where the catheter enters your body. Do not take baths. Replace wet leg straps with dry ones, if this applies.  Do not use powders, sprays, or lotions on the genital area. Only use creams, lotions, or ointments as directed by your caregiver.  For females, wipe from front to back after each bowel movement.  Drink enough fluids to keep your urine clear or pale yellow unless you have a fluid restriction.  Do not let the drainage bag or tubing touch or lie on the floor.  Wear cotton underwear to absorb moisture and to keep your skin drier.   SEEK MEDICAL CARE IF:   Your urine is cloudy or smells unusually bad.  Your catheter becomes clogged.  You are not draining urine into the bag or your bladder feels full.  Your catheter starts to leak. SEEK IMMEDIATE MEDICAL CARE IF:   You have pain, swelling, redness, or pus where the catheter enters the body.  You have pain in the abdomen,  legs, lower back, or bladder.  You have a fever.  You see blood fill the catheter, or your urine is pink or red.  You have nausea, vomiting, or chills.  Your catheter gets pulled out. MAKE SURE YOU:   Understand these instructions.  Will watch your condition.  Will get help right away  if you are not doing well or get worse.   This information is not intended to replace advice given to you by your health care provider. Make sure you discuss any questions you have with your health care provider.   Document Released: 09/07/2005 Document Revised: 01/22/2014 Document Reviewed: 08/29/2012 Elsevier Interactive Patient Education [employer].

## 2015-11-04 NOTE — Transfer of Care (Signed)
Immediate Anesthesia Transfer of Care Note  Patient: Kevin Kane  Procedure(s) Performed: Procedure(s) (LRB): RIGHT URETEROSCOPY/RETROGRADE PYELOGRAM/HOLMIUM LASER LITHOTRIPSY/STENT PLACEMENT (Right)  Patient Location: PACU  Anesthesia Type: General  Level of Consciousness: awake, sedated, patient cooperative and responds to stimulation  Airway & Oxygen Therapy: Patient Spontanous Breathing and Patient connected to face mask oxygen  Post-op Assessment: Report given to PACU RN, Post -op Vital signs reviewed and stable and Patient moving all extremities  Post vital signs: Reviewed and stable  Complications: No apparent anesthesia complications

## 2015-11-04 NOTE — Anesthesia Preprocedure Evaluation (Addendum)
Anesthesia Evaluation    Airway Mallampati: III  TM Distance: >3 FB Neck ROM: Full    Dental  (+) Teeth Intact, Dental Advisory Given   Pulmonary asthma , COPD,  COPD inhaler, former smoker,    breath sounds clear to auscultation       Cardiovascular hypertension, + CAD, + Past MI and + Cardiac Stents  + dysrhythmias  Rhythm:Regular Rate:Normal     Neuro/Psych negative neurological ROS  negative psych ROS   GI/Hepatic GERD  ,  Endo/Other  Hypothyroidism   Renal/GU Renal disease  negative genitourinary   Musculoskeletal  (+) Arthritis , Rheumatoid disorders,    Abdominal   Peds negative pediatric ROS (+)  Hematology   Anesthesia Other Findings - HLD  Reproductive/Obstetrics                            Lab Results  Component Value Date   WBC 14.6 07/11/2015   HGB 11.9* 09/30/2015   HCT 35.0* 09/30/2015   PLT 505* 07/11/2015   Lab Results  Component Value Date   CREATININE 1.9* 07/11/2015   BUN 30* 07/11/2015   NA 147* 09/30/2015   K 4.8 09/30/2015   No results found for: INR, PROTIME   Anesthesia Physical Anesthesia Plan  ASA: III  Anesthesia Plan: General   Post-op Pain Management:    Induction: Intravenous  Airway Management Planned: LMA  Additional Equipment:   Intra-op Plan:   Post-operative Plan: Extubation in OR  Informed Consent: I have reviewed the patients History and Physical, chart, labs and discussed the procedure including the risks, benefits and alternatives for the proposed anesthesia with the patient or authorized representative who has indicated his/her understanding and acceptance.   Dental advisory given  Plan Discussed with: CRNA  Anesthesia Plan Comments:         Anesthesia Quick Evaluation

## 2015-11-04 NOTE — Anesthesia Postprocedure Evaluation (Signed)
Anesthesia Post Note  Patient: Kevin Kane  Procedure(s) Performed: Procedure(s) (LRB): RIGHT URETEROSCOPY/RETROGRADE PYELOGRAM/HOLMIUM LASER LITHOTRIPSY/STENT PLACEMENT (Right)  Patient location during evaluation: PACU Anesthesia Type: General Level of consciousness: awake and alert Pain management: pain level controlled Vital Signs Assessment: post-procedure vital signs reviewed and stable Respiratory status: spontaneous breathing, nonlabored ventilation, respiratory function stable and patient connected to nasal cannula oxygen Cardiovascular status: blood pressure returned to baseline and stable Postop Assessment: no signs of nausea or vomiting Anesthetic complications: no    Last Vitals:  Filed Vitals:   11/04/15 1355 11/04/15 1400  BP:  134/53  Pulse: 57 61  Temp:    Resp: 8 12    Last Pain: There were no vitals filed for this visit.               Effie Berkshire

## 2015-11-05 ENCOUNTER — Encounter (HOSPITAL_BASED_OUTPATIENT_CLINIC_OR_DEPARTMENT_OTHER): Payer: Self-pay | Admitting: Urology

## 2015-11-07 DIAGNOSIS — N201 Calculus of ureter: Secondary | ICD-10-CM | POA: Diagnosis not present

## 2015-11-12 DIAGNOSIS — N201 Calculus of ureter: Secondary | ICD-10-CM | POA: Diagnosis not present

## 2015-11-14 ENCOUNTER — Ambulatory Visit (INDEPENDENT_AMBULATORY_CARE_PROVIDER_SITE_OTHER): Payer: Medicare Other | Admitting: Internal Medicine

## 2015-11-14 VITALS — BP 140/78 | HR 71 | Temp 98.4°F | Resp 20 | Ht 67.0 in | Wt 258.4 lb

## 2015-11-14 DIAGNOSIS — Z87442 Personal history of urinary calculi: Secondary | ICD-10-CM | POA: Diagnosis not present

## 2015-11-14 DIAGNOSIS — M6249 Contracture of muscle, multiple sites: Secondary | ICD-10-CM | POA: Diagnosis not present

## 2015-11-14 DIAGNOSIS — M25552 Pain in left hip: Secondary | ICD-10-CM

## 2015-11-14 DIAGNOSIS — M069 Rheumatoid arthritis, unspecified: Secondary | ICD-10-CM

## 2015-11-14 DIAGNOSIS — D170 Benign lipomatous neoplasm of skin and subcutaneous tissue of head, face and neck: Secondary | ICD-10-CM | POA: Diagnosis not present

## 2015-11-14 DIAGNOSIS — M62838 Other muscle spasm: Secondary | ICD-10-CM

## 2015-11-14 MED ORDER — CYCLOBENZAPRINE HCL 10 MG PO TABS: 10.0000 mg | ORAL_TABLET | Freq: Three times a day (TID) | ORAL | Status: DC | PRN

## 2015-11-14 NOTE — Patient Instructions (Signed)
Neck muscle spasms: I recommend you use warm compresses or a heating pad for 20 mins 3-4 times per day for the next week or until you feel better.  Also, use flexeril muscle relaxant three times a day as needed for pain in your neck.  Be careful with walking and do not drive while taking this.    If you are not feeling better by midweek, please call our office.

## 2015-11-14 NOTE — Progress Notes (Signed)
Patient ID: Kevin Kane, male   DOB: [birth year], 82 y.o.   MRN: [id number]  [facility] clinic Provider: Kirklin Mcduffee L. Mariea Clonts, D.O., C.M.D.  Goals of Care:  Advanced Directives 11/14/2015  Does patient have an advance directive? Yes  Type of Advance Directive [place]  Does patient want to make changes to advanced directive? No - Patient declined  Copy of advanced directive(s) in chart? Yes    Chief Complaint  Patient presents with  . Acute Visit    mass in back of neck, increased neck pain recently    HPI: Patient is a 82 y.o. male with RA on plaquenil, nephrolithiasis, htn, COPD with asthma (PFTs last 10/10/13), hypothyroidism, eczema, venous stasis dermatitis, morbid obesity, hip pain, low back pain, gait disorder due to leg length discrepancy was seen today for an acute visit for mass in back of neck and increased neck pain recently (past three days). Decreased sidebending due to pain.  No known injury.  Has been using oxycodone for pain.  Asks if she can use ibuprofen or flexeril.  He's been sleeping in his chair.  He has difficulty moving his neck side to side (rotation and sidebending).    He had his kidney stone procedure and had the stent removed last week.    Incidentally, he also reports decreased appetite since 10/11/15.  He's had no dysuria, no nausea, no vomiting, no known blood in stools.  He has lost weight since Jan (12# per wife, but not the case when weights reviewed).  He's been depressed per his wife also.  He has some nightsweats.    He travels back and forth b/w here and [place] (goes to [place] in summer) so I have less than 1/2 of his records which makes this quite challenging to decipher.  Past Medical History  Diagnosis Date  . Elevated prostate specific antigen (PSA) 10/10/2006  . Hyperlipidemia   . Hypertension   . Rheumatoid arthritis ([place]) 05/2013  . OA (osteoarthritis)   . First degree heart block   . Right ureteral stone   . Nephrolithiasis     right  .  History of kidney stones   . Eczema   . COPD mixed type [facility]) pulmologist-  dr Reginia Naas [facility])  note in epic under Care Everywhere tab)    per PFTs: 10/10/2013: Evidence of moderate obstructive lung disease with small airway involvement (asthma); FEV1 54% predicted, FEV1/FVC <70%, FEF25-75% is reduced. Mild response to bronchodilator; FEV1 w/ 8% change after bronchodilator.    -- and  mild intermittant asthma  . Hypothyroidism   . History of MI (myocardial infarction)     2001--  s/p  PCI and stent  . Mild intermittent asthma   . Coronary atherosclerosis of native coronary artery     cardiologist-  dr schwengel  in [place], [place] (where pt lives during spring and summer)  . GERD (gastroesophageal reflux disease)   . Frequency of urination   . Urgency of urination   . Herpes simplex of eye     bilateral eye-  takes acyclovir daily  . Wears glasses   . Wears hearing aid     bilateral  . Bilateral lower extremity edema   . At risk for sleep apnea     STOP-BANG=5      SENT TO PCP 09-25-2014    Past Surgical History  Procedure Laterality Date  . Total hip arthroplasty Left 2001  . Cataract extraction w/ intraocular lens  implant, bilateral  Bilateral 2014  . Colonoscopy  2010  . Coronary angioplasty with stent placement  2001   [facility], [place])    PCI and stenting  . Tonsillectomy  as child  . Pilonidal cyst excision  2006  . Cystoscopy with retrograde pyelogram, ureteroscopy and stent placement Right 09/30/2015    Procedure: RIGHT URETEROSCOPY, RETROGRADE PYELOGRAM, LASER LITHOTRIPSY AND STENT PLACEMENT;  Surgeon: Kathie Rhodes, MD;  Location: [place];  Service: Urology;  Laterality: Right;  . Cystoscopy/ureteroscopy/holmium laser/stent placement Right 11/04/2015    Procedure: RIGHT URETEROSCOPY/RETROGRADE PYELOGRAM/HOLMIUM LASER LITHOTRIPSY/STENT PLACEMENT;  Surgeon: Kathie Rhodes, MD;  Location: [place];  Service: Urology;  Laterality: Right;     Allergies  Allergen Reactions  . Ketoconazole Other (See Comments)    Adverse skin reaction-- legs turned red and purple      Medication List       This list is accurate as of: 11/14/15  4:18 PM.  Always use your most recent med list.               acyclovir 400 MG tablet  Commonly known as:  ZOVIRAX  Take 400 mg by mouth every morning. Take one tablet daily for virus in eyes     albuterol 108 (90 Base) MCG/ACT inhaler  Commonly known as:  PROAIR HFA  Inhale 2 puffs into the lungs every 6 (six) hours as needed for wheezing or shortness of breath.     aspirin 81 MG tablet  Take 81 mg by mouth daily.     atorvastatin 80 MG tablet  Commonly known as:  LIPITOR  Take 80 mg by mouth every morning. Take one daily for cholesterol     budesonide 0.25 MG/2ML nebulizer solution  Commonly known as:  PULMICORT  Take 0.25 mg by nebulization as needed.     calcium carbonate 500 MG chewable tablet  Commonly known as:  TUMS - dosed in mg elemental calcium  Chew 1 tablet by mouth as needed for indigestion or heartburn.     CENTRUM SILVER ADULT 50+ Tabs  Take 1 tablet by mouth every morning.     CERAVE Crea  Apply topically. Apply moisturizer as needed     ciclopirox 0.77 % cream  Commonly known as:  LOPROX  Apply topically. Apply behind knee for fungus as needed     clobetasol cream 0.05 %  Commonly known as:  TEMOVATE  Apply 1 application topically. As needed for eczema     fexofenadine 180 MG tablet  Commonly known as:  ALLEGRA  Take 180 mg by mouth every morning. Take one tablet daily for allergies     fluocinonide cream 0.05 %  Commonly known as:  LIDEX  Apply 1 application topically. Apply cream as needed for eczzema     hydroxychloroquine 200 MG tablet  Commonly known as:  PLAQUENIL  Take 200 mg by mouth every morning.     ipratropium-albuterol 0.5-2.5 (3) MG/3ML Soln  Commonly known as:  DUONEB  Take 3 mLs by nebulization every 6 (six) hours as needed. Use  in nebulizer every 6 hours in emergency     levothyroxine 25 MCG tablet  Commonly known as:  SYNTHROID, LEVOTHROID  Take 25 mcg by mouth daily before breakfast.     metoprolol succinate 50 MG 24 hr tablet  Commonly known as:  TOPROL-XL  Take 50 mg by mouth every morning. Take one tablet daily for blood pressure     nitroGLYCERIN 0.4 MG SL tablet  Commonly known as:  NITROSTAT  Place 0.4 mg under the tongue every 5 (five) minutes as needed for chest pain.     oxyCODONE-acetaminophen 10-325 MG tablet  Commonly known as:  PERCOCET  Take 1 tablet by mouth every 4 (four) hours as needed for pain.     phenazopyridine 200 MG tablet  Commonly known as:  PYRIDIUM  Take 1 tablet (200 mg total) by mouth 3 (three) times daily as needed for pain.     tamsulosin 0.4 MG Caps capsule  Commonly known as:  FLOMAX  Take 0.4 mg by mouth daily after breakfast. Reported on 11/14/2015     tolnaftate 1 % spray  Commonly known as:  TINACTIN  Apply topically. Spray for fungus as needed     triamcinolone cream 0.1 %  Commonly known as:  KENALOG  Apply 1 application topically. Apply for eczema behind ear and belly button as needed        Review of Systems:  Review of Systems  Constitutional: Positive for diaphoresis, activity change, appetite change and unexpected weight change. Negative for fever, chills and fatigue.  HENT: Negative for congestion, sinus pressure and sore throat.   Eyes: Negative for pain.  Respiratory: Positive for shortness of breath. Negative for cough, chest tightness, wheezing and stridor.        Copd  Cardiovascular: Positive for leg swelling. Negative for chest pain.  Gastrointestinal: Negative for nausea, vomiting, abdominal pain, diarrhea, blood in stool and abdominal distention.  Genitourinary: Negative for hematuria, decreased urine volume and difficulty urinating.  Musculoskeletal: Positive for myalgias, back pain, arthralgias, neck pain and neck stiffness.       Leg  length discrepancy  Skin: Positive for color change and pallor.  Neurological: Positive for weakness. Negative for dizziness.  Hematological: Negative for adenopathy. Does not bruise/bleed easily.  Psychiatric/Behavioral: Positive for confusion, sleep disturbance and dysphoric mood. Negative for suicidal ideas, behavioral problems and agitation.       Sleeping in chair    Health Maintenance  Topic Date Due  . TETANUS/TDAP  08/07/1949  . ZOSTAVAX  08/06/1992  . PNA vac Low Risk Adult (1 of 2 - PCV13) 08/06/1997  . INFLUENZA VACCINE  04/21/2016    Physical Exam: Filed Vitals:   11/14/15 1526  BP: 140/78  Pulse: 71  Temp: 98.4 F (36.9 C)  TempSrc: Oral  Resp: 20  Height: 5\' 7"  (1.702 m)  Weight: 258 lb 6.4 oz (117.209 kg)  SpO2: 92%   Body mass index is 40.46 kg/(m^2). Physical Exam  Constitutional: He is oriented to person, place, and time. He appears well-developed and well-nourished. No distress.  Is dyspneic on exertion  HENT:  Head: Normocephalic and atraumatic.  Cardiovascular: Normal rate, regular rhythm, normal heart sounds and intact distal pulses.   Pulmonary/Chest: Effort normal and breath sounds normal. He has no wheezes. He has no rales.  Abdominal: Soft. Bowel sounds are normal. He exhibits no distension. There is no tenderness. There is no rebound and no guarding.  Musculoskeletal:  Leg length discrepancy; walks with walker, had a hard time getting up on table for exam; left sacroiliac region tender; right side of neck tender over paraspinous muscles and decreased rotation and sidebending to left as a result  Lymphadenopathy:       Head (right side): No submental, no submandibular, no tonsillar, no preauricular, no posterior auricular and no occipital adenopathy present.       Head (left side): No submental, no submandibular, no tonsillar, no  preauricular, no posterior auricular and no occipital adenopathy present.    He has no cervical adenopathy.       Right  cervical: No superficial cervical, no deep cervical and no posterior cervical adenopathy present.      Left cervical: No superficial cervical, no deep cervical and no posterior cervical adenopathy present.  Neurological: He is alert and oriented to person, place, and time.  Skin: Skin is warm and dry. There is pallor.  Large oval shaped lipoma on right posterior neck up into occipital region of scalp, nontender, mobile  Psychiatric:  Labile mood    Labs reviewed: Basic Metabolic Panel:  Recent Labs  11/29/14 02/28/15 07/11/15 09/30/15 1028 11/04/15 1104  NA 143  --  142 147* 145  K 4.3  --  4.7 4.8 4.5  CL  --   --   --   --  108  GLUCOSE  --   --   --  100* 94  BUN 28*  --  30*  --  29*  CREATININE 1.2  --  1.9*  --  1.50*  TSH 7.63* 3.81  --   --   --    Liver Function Tests:  Recent Labs  11/29/14 07/11/15  AST 19 21  ALT 20 25  ALKPHOS 91 98   No results for input(s): LIPASE, AMYLASE in the last 8760 hours. No results for input(s): AMMONIA in the last 8760 hours. CBC:  Recent Labs  11/29/14 07/11/15 09/30/15 1028 11/04/15 1104  WBC 12.8 14.6  --   --   HGB 12.3* 12.6* 11.9* 11.2*  HCT 37* 39*  39* 35.0* 33.0*  PLT 349 505*  --   --    Lipid Panel:  Recent Labs  11/29/14  CHOL 138  HDL 66  LDLCALC 62  TRIG 75   No results found for: HGBA1C  Procedures since last visit: Kub Day Of Procedure  11/04/2015  CLINICAL DATA:  Follow-up right-sided ureteral stone EXAM: ABDOMEN - 1 VIEW COMPARISON:  KUB of September 30, 2015 and abdominal CT scan of August 08, 2015. FINDINGS: There is a persistent coarse calcification adjacent to the inferior aspect of the right ureteral stent. The distal tip of the stent appears uncoiled and may not be completely within the relatively nondistended urinary bladder. Proximally no right ureteral stones are observed. There is a rim calcified structure that projects in the right upper quadrant of the abdomen consistent with a known  gallstone. No left-sided stones are observed. IMPRESSION: 1. Persistent coarse calcification which may reflect a distal right ureteral stone measuring 6 x 11 mm. 2. The distal pigtail of the stent is uncoiled and may not be completely within the urinary bladder lumen. 3. No evidence of renal stones elsewhere 4. Large gallstone which is been previously demonstrated. Electronically Signed   By: David  Martinique M.D.   On: 11/04/2015 10:13    Assessment/Plan 1. Cervical paraspinous muscle spasm - due to sleeping in a chair most likely, plus arthritis in his neck -decreased rotation and SB to left - cyclobenzaprine (FLEXERIL) 10 MG tablet; Take 1 tablet (10 mg total) by mouth 3 (three) times daily as needed for muscle spasms (for one week).  Dispense: 21 tablet; Refill: 0 - also use heat, when not using heat, may apply topicals like theragesic cream or icy hot  2. Lipoma of neck -large and is in vicinity of painful area, but is a bit more superior and itself is not tender, is mobile fatty  mass  3. History of nephrolithiasis -s/p removal of ureteral calculus -following with urology here  4. Left hip pain -has leg length discrepancy and RA -having more difficulty walking and using his walker more often -continues on plaquenil and oxycodone  5. Rheumatoid arthritis involving both ankles, unspecified rheumatoid factor presence (HCC) -cont plaquenil, does get regular eye exams -f/u labs at regular visit  Labs/tests ordered:  No orders of the defined types were placed in this encounter.   Next appt:  1 wk to address "weight loss", poor po intake, paleness, weakness and see if neck better--need records from primary in [place]. Kalenna Millett, D.O. [facility] [address], [place] [address] Cell Phone (Mon-Fri 8am-5pm):  [phone] On Call:  [phone] & follow prompts after 5pm & weekends Office Phone:  [phone] Office Fax:   [phone]

## 2015-11-15 DIAGNOSIS — H4911 Fourth [trochlear] nerve palsy, right eye: Secondary | ICD-10-CM | POA: Diagnosis not present

## 2015-11-19 DIAGNOSIS — J449 Chronic obstructive pulmonary disease, unspecified: Secondary | ICD-10-CM | POA: Diagnosis not present

## 2015-11-19 DIAGNOSIS — I10 Essential (primary) hypertension: Secondary | ICD-10-CM | POA: Diagnosis not present

## 2015-11-19 LAB — BASIC METABOLIC PANEL
BUN: 25 mg/dL — AB (ref 4–21)
Creatinine: 1.6 mg/dL — AB (ref 0.6–1.3)
GLUCOSE: 90 mg/dL
POTASSIUM: 4.9 mmol/L (ref 3.4–5.3)
Sodium: 144 mmol/L (ref 137–147)

## 2015-11-19 LAB — CBC AND DIFFERENTIAL
HEMATOCRIT: 31 % — AB (ref 41–53)
HEMOGLOBIN: 9.8 g/dL — AB (ref 13.5–17.5)
PLATELETS: 452 10*3/uL — AB (ref 150–399)
WBC: 14.4 10*3/mL

## 2015-11-20 ENCOUNTER — Non-Acute Institutional Stay: Payer: Medicare Other | Admitting: Internal Medicine

## 2015-11-20 ENCOUNTER — Encounter: Payer: Self-pay | Admitting: Internal Medicine

## 2015-11-20 VITALS — BP 120/62 | HR 72 | Temp 97.5°F | Ht 67.0 in | Wt 257.0 lb

## 2015-11-20 DIAGNOSIS — M62838 Other muscle spasm: Secondary | ICD-10-CM

## 2015-11-20 DIAGNOSIS — J45909 Unspecified asthma, uncomplicated: Secondary | ICD-10-CM

## 2015-11-20 DIAGNOSIS — M6249 Contracture of muscle, multiple sites: Secondary | ICD-10-CM

## 2015-11-20 DIAGNOSIS — D72829 Elevated white blood cell count, unspecified: Secondary | ICD-10-CM

## 2015-11-20 DIAGNOSIS — J449 Chronic obstructive pulmonary disease, unspecified: Secondary | ICD-10-CM | POA: Diagnosis not present

## 2015-11-20 DIAGNOSIS — D473 Essential (hemorrhagic) thrombocythemia: Secondary | ICD-10-CM | POA: Diagnosis not present

## 2015-11-20 DIAGNOSIS — D649 Anemia, unspecified: Secondary | ICD-10-CM | POA: Diagnosis not present

## 2015-11-20 DIAGNOSIS — D75839 Thrombocytosis, unspecified: Secondary | ICD-10-CM

## 2015-11-20 DIAGNOSIS — J4489 Other specified chronic obstructive pulmonary disease: Secondary | ICD-10-CM

## 2015-11-20 NOTE — Progress Notes (Signed)
Patient ID: Kevin Kane, male   DOB: 1930/02/24, 82 y.o.   MRN: [id number]   Location: [facility] (12)  Provider: Lorell Thibodaux L. Mariea Clonts, D.O., C.M.D.  Code Status: DNR Goals of Care:  Advanced Directives 11/20/2015  Does patient have an advance directive? Yes  Type of Advance Directive [place]  Does patient want to make changes to advanced directive? -  Copy of advanced directive(s) in chart? Yes     Chief Complaint  Patient presents with  . Medical Management of Chronic Issues    follow up on neck pain. Per [facility] clinic nurse, pt SOB and pale.  Here with wife    HPI: Patient is a 82 y.o. male with a h/o COPD with asthma with hypoxia on pulmicort, albuterol and duonebs prn, RA on plaquenil, GERD, morbid obesity, hypothyroidism, CAD s/p stent, right ureteral calculus seen today for an acute visit for f/u on his neck pain and concerns about paleness from clinic nurse and shortness of breath from pt.  He had noted being sob at the last visit and we discussed that we would need to further work this up.    When he saw the clinic nurse, he was noted to be pale and a cbc order was obtained--this revealed a drop in his hgb to 9.8 from 12.6 in October of last year.  Upon review of his labs from the past, he's been having leukocytosis 12-14 range ongoing at least since 4/16 (first cbc on file) and thrombocytosis (since 10/16) with hgb running in the 12 range also since 4/16.  He was getting a lot of his care in [place] so we are limited in our number of lab results and I have only seen him today for the third time.  Plaquenil appears to typically cause agranulocytosis, thrombocytopenia, aplastic anemia not leukocytosis and thrombocytosis.  The only lab record from [place] on file is a normal TSH from 6/16.  Renal function is worse at 1.6 (but better from 1.9)  Double vision started when he was driving after leaving here--Dr.  Valetta Close did not see an explanation for this.  Could have been med related or nerve related.  He asked him to return in a month.  Pt says it's improved since then.  He also reports some difficulty with word finding over the weekend--his wife noticed this when he was speaking to one of his daughters.  He got confused which daughter he was speaking to.  She was concerned he'd had a stroke.  No difficulty moving his extremities different than the norm.    Says he had a normal cscope in [place] 11/10.  He had no blood in his stool.    He is using a walker more to walk.  Cannot even walk to the car w/o getting short of breath.  He also has PND.  He had a cardiologist in [place] and had echo.  Saw Dr. Annamaria Boots 1/15 and had PFTs here.  Past Medical History  Diagnosis Date  . Elevated prostate specific antigen (PSA) 10/10/2006  . Hyperlipidemia   . Hypertension   . Rheumatoid arthritis ([place]) 05/2013  . OA (osteoarthritis)   . First degree heart block   . Right ureteral stone   . Nephrolithiasis     right  . History of kidney stones   . Eczema   . COPD mixed type [facility]) pulmologist-  dr Reginia [facility]  note in epic  under Care Everywhere tab)    per PFTs: 10/10/2013: Evidence of moderate obstructive lung disease with small airway involvement (asthma); FEV1 54% predicted, FEV1/FVC <70%, FEF25-75% is reduced. Mild response to bronchodilator; FEV1 w/ 8% change after bronchodilator.    -- and  mild intermittant asthma  . Hypothyroidism   . History of MI (myocardial infarction)     2001--  s/p  PCI and stent  . Mild intermittent asthma   . Coronary atherosclerosis of native coronary artery     cardiologist-  dr schwengel  in [place], [place] (where pt lives during spring and summer)  . GERD (gastroesophageal reflux disease)   . Frequency of urination   . Urgency of urination   . Herpes simplex of eye     bilateral eye-  takes acyclovir daily  . Wears glasses   . Wears hearing aid     bilateral  . Bilateral  lower extremity edema   . At risk for sleep apnea     STOP-BANG=5      SENT TO PCP 09-25-2014    Past Surgical History  Procedure Laterality Date  . Total hip arthroplasty Left 2001  . Cataract extraction w/ intraocular lens  implant, bilateral Bilateral 2014  . Colonoscopy  2010  . Coronary angioplasty with stent placement  2001   [facility], [place])    PCI and stenting  . Tonsillectomy  as child  . Pilonidal cyst excision  2006  . Cystoscopy with retrograde pyelogram, ureteroscopy and stent placement Right 09/30/2015    Procedure: RIGHT URETEROSCOPY, RETROGRADE PYELOGRAM, LASER LITHOTRIPSY AND STENT PLACEMENT;  Surgeon: Kathie Rhodes, MD;  Location: [place];  Service: Urology;  Laterality: Right;  . Cystoscopy/ureteroscopy/holmium laser/stent placement Right 11/04/2015    Procedure: RIGHT URETEROSCOPY/RETROGRADE PYELOGRAM/HOLMIUM LASER LITHOTRIPSY/STENT PLACEMENT;  Surgeon: Kathie Rhodes, MD;  Location: [place];  Service: Urology;  Laterality: Right;    Allergies  Allergen Reactions  . Ketoconazole Other (See Comments)    Adverse skin reaction-- legs turned red and purple      Medication List       This list is accurate as of: 11/20/15  8:59 AM.  Always use your most recent med list.               acyclovir 400 MG tablet  Commonly known as:  ZOVIRAX  Take 400 mg by mouth every morning. Take one tablet daily for virus in eyes     albuterol 108 (90 Base) MCG/ACT inhaler  Commonly known as:  PROAIR HFA  Inhale 2 puffs into the lungs every 6 (six) hours as needed for wheezing or shortness of breath.     aspirin 81 MG tablet  Take 81 mg by mouth daily.     atorvastatin 80 MG tablet  Commonly known as:  LIPITOR  Take 80 mg by mouth every morning. Take one daily for cholesterol     budesonide 0.25 MG/2ML nebulizer solution  Commonly known as:  PULMICORT  Take 0.25 mg by nebulization as needed.     calcium carbonate 500 MG chewable tablet    Commonly known as:  TUMS - dosed in mg elemental calcium  Chew 1 tablet by mouth as needed for indigestion or heartburn.     CENTRUM SILVER ADULT 50+ Tabs  Take 1 tablet by mouth every morning.     CERAVE Crea  Apply topically. Apply moisturizer as needed     ciclopirox 0.77 % cream  Commonly known as:  LOPROX  Apply topically. Apply behind knee for fungus as needed     clobetasol cream 0.05 %  Commonly known as:  TEMOVATE  Apply 1 application topically. As needed for eczema     cyclobenzaprine 10 MG tablet  Commonly known as:  FLEXERIL  Take 1 tablet (10 mg total) by mouth 3 (three) times daily as needed for muscle spasms (for one week).     fexofenadine 180 MG tablet  Commonly known as:  ALLEGRA  Take 180 mg by mouth every morning. Take one tablet daily for allergies     fluocinonide cream 0.05 %  Commonly known as:  LIDEX  Apply 1 application topically. Apply cream as needed for eczzema     hydroxychloroquine 200 MG tablet  Commonly known as:  PLAQUENIL  Take 200 mg by mouth every morning.     ipratropium-albuterol 0.5-2.5 (3) MG/3ML Soln  Commonly known as:  DUONEB  Take 3 mLs by nebulization every 6 (six) hours as needed. Use in nebulizer every 6 hours in emergency     levothyroxine 25 MCG tablet  Commonly known as:  SYNTHROID, LEVOTHROID  Take 25 mcg by mouth daily before breakfast.     metoprolol succinate 50 MG 24 hr tablet  Commonly known as:  TOPROL-XL  Take 50 mg by mouth every morning. Take one tablet daily for blood pressure     nitroGLYCERIN 0.4 MG SL tablet  Commonly known as:  NITROSTAT  Place 0.4 mg under the tongue every 5 (five) minutes as needed for chest pain.     oxyCODONE-acetaminophen 10-325 MG tablet  Commonly known as:  PERCOCET  Take 1 tablet by mouth every 4 (four) hours as needed for pain.     phenazopyridine 200 MG tablet  Commonly known as:  PYRIDIUM  Take 1 tablet (200 mg total) by mouth 3 (three) times daily as needed for pain.      tamsulosin 0.4 MG Caps capsule  Commonly known as:  FLOMAX  Take 0.4 mg by mouth daily after breakfast. Reported on 11/14/2015     tolnaftate 1 % spray  Commonly known as:  TINACTIN  Apply topically. Spray for fungus as needed     triamcinolone cream 0.1 %  Commonly known as:  KENALOG  Apply 1 application topically. Apply for eczema behind ear and belly button as needed        Review of Systems:  Review of Systems  Constitutional: Positive for unexpected weight change.       Minor weight loss  HENT: Negative for congestion.   Respiratory: Positive for shortness of breath. Negative for cough, chest tightness and wheezing.   Cardiovascular: Positive for leg swelling. Negative for chest pain and palpitations.  Gastrointestinal: Negative for abdominal pain.  Genitourinary: Negative for dysuria, urgency and frequency.  Musculoskeletal: Positive for back pain, arthralgias and gait problem.  Skin: Positive for color change and pallor.  Neurological: Positive for speech difficulty and weakness. Negative for dizziness, tremors, seizures, facial asymmetry, light-headedness, numbness and headaches.       Word finding difficulty off and on  Psychiatric/Behavioral: Positive for confusion.    Health Maintenance  Topic Date Due  . TETANUS/TDAP  08/07/1951  . ZOSTAVAX  08/06/1992  . PNA vac Low Risk Adult (1 of 2 - PCV13) 08/06/1997  . INFLUENZA VACCINE  04/21/2016    Physical Exam: Filed Vitals:   11/20/15 0847  BP: 120/62  Pulse: 72  Temp: 97.5 F (36.4 C)  TempSrc: Oral  Height: 5'  7" (1.702 m)  Weight: 257 lb (116.574 kg)  SpO2: 95%   Body mass index is 40.24 kg/(m^2). Physical Exam  Constitutional: He is oriented to person, place, and time. He appears well-developed and well-nourished. No distress.  HENT:  Head: Normocephalic and atraumatic.  Cardiovascular: Normal rate, regular rhythm, normal heart sounds and intact distal pulses.   Pulmonary/Chest: Effort normal  and breath sounds normal. He has no wheezes.  Abdominal: Soft. Bowel sounds are normal. He exhibits no distension and no mass. There is no tenderness. There is no rebound and no guarding.  Genitourinary: Rectum normal. Guaiac negative stool.  Very little stool in rectal vault  Musculoskeletal: Normal range of motion.  Neurological: He is alert and oriented to person, place, and time. No cranial nerve deficit. Coordination normal.  Skin: Skin is warm and dry. There is pallor.    Labs reviewed: Basic Metabolic Panel:  Recent Labs  11/29/14 02/28/15 07/11/15 09/30/15 1028 11/04/15 1104 11/19/15  NA 143  --  142 147* 145 144  K 4.3  --  4.7 4.8 4.5 4.9  CL  --   --   --   --  108  --   GLUCOSE  --   --   --  100* 94  --   BUN 28*  --  30*  --  29* 25*  CREATININE 1.2  --  1.9*  --  1.50* 1.6*  TSH 7.63* 3.81  --   --   --   --    Liver Function Tests:  Recent Labs  11/29/14 07/11/15  AST 19 21  ALT 20 25  ALKPHOS 91 98   No results for input(s): LIPASE, AMYLASE in the last 8760 hours. No results for input(s): AMMONIA in the last 8760 hours. CBC:  Recent Labs  11/29/14 07/11/15 09/30/15 1028 11/04/15 1104 11/19/15  WBC 12.8 14.6  --   --  14.4  HGB 12.3* 12.6* 11.9* 11.2* 9.8*  HCT 37* 39*  39* 35.0* 33.0* 31*  PLT 349 505*  --   --  452*   Lipid Panel:  Recent Labs  11/29/14  CHOL 138  HDL 66  LDLCALC 62  TRIG 75   No results found for: HGBA1C  Procedures since last visit: Kub Day Of Procedure  11/04/2015  CLINICAL DATA:  Follow-up right-sided ureteral stone EXAM: ABDOMEN - 1 VIEW COMPARISON:  KUB of September 30, 2015 and abdominal CT scan of August 08, 2015. FINDINGS: There is a persistent coarse calcification adjacent to the inferior aspect of the right ureteral stent. The distal tip of the stent appears uncoiled and may not be completely within the relatively nondistended urinary bladder. Proximally no right ureteral stones are observed. There is a rim  calcified structure that projects in the right upper quadrant of the abdomen consistent with a known gallstone. No left-sided stones are observed. IMPRESSION: 1. Persistent coarse calcification which may reflect a distal right ureteral stone measuring 6 x 11 mm. 2. The distal pigtail of the stent is uncoiled and may not be completely within the urinary bladder lumen. 3. No evidence of renal stones elsewhere 4. Large gallstone which is been previously demonstrated. Electronically Signed   By: David  Martinique M.D.   On: 11/04/2015 10:13   Assessment/Plan 1. Cervical paraspinous muscle spasm -improved, but still some tenderness at times -has been using the flexeril as I directed him to do with benefit, but now feels he is not having pain (and has been confused with  some word finding issues) so advised to stop it and also to only use his percocet as needed (came from urology after kidney stone)  2. Anemia, unspecified anemia type -newly worse--will work this up -usually 12, now 9.8 over about 5 mos -heme neg, but very little stool available to test -iron panel, ferritin, b12/folate, peripheral smear  3. Thrombocytosis ([place]) -present at least since last year -not taking iron -has not been transfused -plaquenil typically decreases platelets not increases -check peripheral smear  4. Leukocytosis -check peripheral smear -does not have any signs of infection at present -suspect bone marrow disorder -need PCPs records from [place]--? Has he seen hematology before  5. Chronic obstructive airway disease with asthma ([place]) -may be worsening and causing his shortness of breath, but suspect it's more related to his increased anemia and deconditioning -had PFTs a couple of years ago with Dr. Annamaria Boots here -also need cardiology records from there with echo  6. Morbid obesity due to excess calories ([place]) -does not exercise due to RA pain -is highly deconditioned  Request records from [place]:  Cardiology/echo,  primary care, cscope (GI)  Labs/tests ordered:  cbc with diff, bmp, liver panel next drawiron panel, ferritin, b12/folate, peripheral smear  Next appt:  11/27/2015   Jyssica Rief L. Faatimah Spielberg, D.O. [facility] [address], [place] [address] Cell Phone (Mon-Fri 8am-5pm):  [phone] On Call:  [phone] & follow prompts after 5pm & weekends Office Phone:  [phone] Office Fax:  [phone]

## 2015-11-21 DIAGNOSIS — D509 Iron deficiency anemia, unspecified: Secondary | ICD-10-CM | POA: Diagnosis not present

## 2015-11-21 DIAGNOSIS — D72829 Elevated white blood cell count, unspecified: Secondary | ICD-10-CM | POA: Diagnosis not present

## 2015-11-21 DIAGNOSIS — E039 Hypothyroidism, unspecified: Secondary | ICD-10-CM | POA: Diagnosis not present

## 2015-11-21 DIAGNOSIS — I10 Essential (primary) hypertension: Secondary | ICD-10-CM | POA: Diagnosis not present

## 2015-11-21 DIAGNOSIS — D473 Essential (hemorrhagic) thrombocythemia: Secondary | ICD-10-CM | POA: Diagnosis not present

## 2015-11-21 DIAGNOSIS — D649 Anemia, unspecified: Secondary | ICD-10-CM | POA: Diagnosis not present

## 2015-11-21 LAB — HEPATIC FUNCTION PANEL
ALT: 24 U/L (ref 10–40)
AST: 21 U/L (ref 14–40)
Alkaline Phosphatase: 100 U/L (ref 25–125)
BILIRUBIN, TOTAL: 0.3 mg/dL
Bilirubin, Direct: 0.1 mg/dL (ref 0.01–0.4)

## 2015-11-21 LAB — TSH: TSH: 3.98 u[IU]/mL (ref 0.41–5.90)

## 2015-11-21 LAB — CBC AND DIFFERENTIAL
HCT: 31 % — AB (ref 41–53)
Hemoglobin: 10 g/dL — AB (ref 13.5–17.5)
PLATELETS: 444 10*3/uL — AB (ref 150–399)
WBC: 11.9 10*3/mL

## 2015-11-21 LAB — BASIC METABOLIC PANEL
BUN: 27 mg/dL — AB (ref 4–21)
CREATININE: 1.7 mg/dL — AB (ref 0.6–1.3)
Glucose: 80 mg/dL
POTASSIUM: 5 mmol/L (ref 3.4–5.3)
SODIUM: 144 mmol/L (ref 137–147)

## 2015-11-22 ENCOUNTER — Telehealth: Payer: Self-pay

## 2015-11-22 MED ORDER — FERROUS SULFATE 325 (65 FE) MG PO TABS: 325.0000 mg | ORAL_TABLET | Freq: Every day | ORAL | Status: DC

## 2015-11-22 NOTE — Telephone Encounter (Signed)
Patient called for lab results. Spoke with Dr. Mariea Clonts; Hgb better at 10.0, 30.9 Platelet 444, TSH normal, Iron total 31, %sat 11, Ferritin normal Vitamin B12 normal 1050, Folate normal 13.0. Per Dr. Mariea Clonts start patient on Ferrous sulfate 325 mg one daily. Told patient, will fax to [facility]. Called doctor's office in [place]. [phone], they haven't seen him since June, will fax records release to [phone], they will fax records.

## 2015-11-24 ENCOUNTER — Encounter: Payer: Self-pay | Admitting: Internal Medicine

## 2015-11-27 ENCOUNTER — Encounter: Payer: Self-pay | Admitting: Internal Medicine

## 2015-11-27 ENCOUNTER — Non-Acute Institutional Stay: Payer: Medicare Other | Admitting: Internal Medicine

## 2015-11-27 VITALS — BP 112/60 | HR 77 | Temp 97.5°F | Wt 250.0 lb

## 2015-11-27 DIAGNOSIS — D509 Iron deficiency anemia, unspecified: Secondary | ICD-10-CM

## 2015-11-27 DIAGNOSIS — R531 Weakness: Secondary | ICD-10-CM | POA: Diagnosis not present

## 2015-11-27 DIAGNOSIS — I10 Essential (primary) hypertension: Secondary | ICD-10-CM | POA: Diagnosis not present

## 2015-11-27 DIAGNOSIS — M069 Rheumatoid arthritis, unspecified: Secondary | ICD-10-CM | POA: Diagnosis not present

## 2015-11-27 DIAGNOSIS — H532 Diplopia: Secondary | ICD-10-CM

## 2015-11-27 NOTE — Patient Instructions (Signed)
Please call [place] physician to fax records to me.

## 2015-11-27 NOTE — Progress Notes (Signed)
Patient ID: Kevin Kane, male   DOB: 1930-03-16, 82 y.o.   MRN: [id number]   Location:  [facility] (12)  Provider: Shereta Crothers L. Mariea Clonts, D.O., C.M.D.   Goals of Care:  Advanced Directives 11/27/2015  Does patient have an advance directive? Yes  Type of Advance Directive Healthcare Power of Attorney  Copy of advanced directive(s) in chart? Yes     Chief Complaint  Patient presents with  . Medical Management of Chronic Issues    1 week follow-up, labs, anemia    HPI: Patient is a 82 y.o. male seen today for follow up on weakness, malaise.    Iron deficiency anemia:  Feels less weak and tired since starting iron. Appears weights are off.  Does think he has lost 10-15 lbs in the last year.  No constipation from the iron.    On his way back from [facility] office, his vision got suddenly blurry.  Saw eye doctor.  Was told to wait a month and maybe it would get better, but says it's blurry still out of both eyes.  Right eye has double vision up and down (almost diagonal).  Has arranged to see ophtho again sooner.  Dr. Valetta Close  Is using the walker rather than the cane for the past week due to the weak feeling he did have.    RA is more bothersome lately.  If walks a little, his hip pain gets better.  Was told he was not deeply rheumatoid.    Faxed request for records to PCP, for echo and for cscope.  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     Does have f/u with urology before he returns to [place] end of April.  Past Medical History  Diagnosis Date  . Elevated prostate specific antigen (PSA) 10/10/2006  . Hyperlipidemia   . Hypertension   . Rheumatoid arthritis ([place]) 05/2013  . OA (osteoarthritis)   . First degree heart block   . Right ureteral stone   . Nephrolithiasis     right  . History of kidney stones   . Eczema   . COPD mixed type [facility]) pulmologist-  dr Reginia Naas [facility])  note in epic under Care  Everywhere tab)    per PFTs: 10/10/2013: Evidence of moderate obstructive lung disease with small airway involvement (asthma); FEV1 54% predicted, FEV1/FVC <70%, FEF25-75% is reduced. Mild response to bronchodilator; FEV1 w/ 8% change after bronchodilator.    -- and  mild intermittant asthma  . Hypothyroidism   . History of MI (myocardial infarction)     2001--  s/p  PCI and stent  . Mild intermittent asthma   . Coronary atherosclerosis of native coronary artery     cardiologist-  dr schwengel  in [place], [place] (where pt lives during spring and summer)  . GERD (gastroesophageal reflux disease)   . Frequency of urination   . Urgency of urination   . Herpes simplex of eye     bilateral eye-  takes acyclovir daily  . Wears glasses   . Wears hearing aid     bilateral  . Bilateral lower extremity edema   . At risk for sleep apnea     STOP-BANG=5      SENT TO PCP 09-25-2014    Past Surgical History  Procedure Laterality Date  . Total hip arthroplasty Left 2001  . Cataract extraction w/ intraocular lens  implant, bilateral  Bilateral 2014  . Colonoscopy  2010  . Coronary angioplasty with stent placement  2001   [facility], [place])    PCI and stenting  . Tonsillectomy  as child  . Pilonidal cyst excision  2006  . Cystoscopy with retrograde pyelogram, ureteroscopy and stent placement Right 09/30/2015    Procedure: RIGHT URETEROSCOPY, RETROGRADE PYELOGRAM, LASER LITHOTRIPSY AND STENT PLACEMENT;  Surgeon: Kathie Rhodes, MD;  Location: [place];  Service: Urology;  Laterality: Right;  . Cystoscopy/ureteroscopy/holmium laser/stent placement Right 11/04/2015    Procedure: RIGHT URETEROSCOPY/RETROGRADE PYELOGRAM/HOLMIUM LASER LITHOTRIPSY/STENT PLACEMENT;  Surgeon: Kathie Rhodes, MD;  Location: [place];  Service: Urology;  Laterality: Right;    Allergies  Allergen Reactions  . Ketoconazole Other (See Comments)    Adverse skin reaction-- legs turned red and purple        Medication List       This list is accurate as of: 11/27/15  4:12 PM.  Always use your most recent med list.               acyclovir 400 MG tablet  Commonly known as:  ZOVIRAX  Take 400 mg by mouth every morning. Take one tablet daily for virus in eyes     albuterol 108 (90 Base) MCG/ACT inhaler  Commonly known as:  PROAIR HFA  Inhale 2 puffs into the lungs every 6 (six) hours as needed for wheezing or shortness of breath.     aspirin 81 MG tablet  Take 81 mg by mouth daily.     atorvastatin 80 MG tablet  Commonly known as:  LIPITOR  Take 80 mg by mouth every morning. Take one daily for cholesterol     budesonide 0.25 MG/2ML nebulizer solution  Commonly known as:  PULMICORT  Take 0.25 mg by nebulization as needed.     calcium carbonate 500 MG chewable tablet  Commonly known as:  TUMS - dosed in mg elemental calcium  Chew 1 tablet by mouth as needed for indigestion or heartburn.     CENTRUM SILVER ADULT 50+ Tabs  Take 1 tablet by mouth every morning.     CERAVE Crea  Apply topically. Apply moisturizer as needed     ciclopirox 0.77 % cream  Commonly known as:  LOPROX  Apply topically. Apply behind knee for fungus as needed     clobetasol cream 0.05 %  Commonly known as:  TEMOVATE  Apply 1 application topically. As needed for eczema     ferrous sulfate 325 (65 FE) MG tablet  Take 1 tablet (325 mg total) by mouth daily with breakfast.     fexofenadine 180 MG tablet  Commonly known as:  ALLEGRA  Take 180 mg by mouth every morning. Take one tablet daily for allergies     fluocinonide cream 0.05 %  Commonly known as:  LIDEX  Apply 1 application topically. Apply cream as needed for eczzema     hydroxychloroquine 200 MG tablet  Commonly known as:  PLAQUENIL  Take 200 mg by mouth every morning.     ipratropium-albuterol 0.5-2.5 (3) MG/3ML Soln  Commonly known as:  DUONEB  Take 3 mLs by nebulization every 6 (six) hours as needed. Use in nebulizer every 6 hours in  emergency     levothyroxine 25 MCG tablet  Commonly known as:  SYNTHROID, LEVOTHROID  Take 25 mcg by mouth daily before breakfast.     metoprolol succinate 50 MG 24 hr tablet  Commonly known as:  TOPROL-XL  Take 50 mg by mouth every morning. Take one tablet daily for blood pressure     nitroGLYCERIN 0.4 MG SL tablet  Commonly known as:  NITROSTAT  Place 0.4 mg under the tongue every 5 (five) minutes as needed for chest pain.     phenazopyridine 200 MG tablet  Commonly known as:  PYRIDIUM  Take 1 tablet (200 mg total) by mouth 3 (three) times daily as needed for pain.     tamsulosin 0.4 MG Caps capsule  Commonly known as:  FLOMAX  Take 0.4 mg by mouth daily after breakfast. Reported on 11/14/2015     tolnaftate 1 % spray  Commonly known as:  TINACTIN  Apply topically. Spray for fungus as needed     triamcinolone cream 0.1 %  Commonly known as:  KENALOG  Apply 1 application topically. Apply for eczema behind ear and belly button as needed        Review of Systems:  Review of Systems  Constitutional: Positive for fatigue. Negative for fever, chills, activity change and appetite change.       Fatigue improved since last week  HENT: Negative for congestion.   Eyes: Positive for visual disturbance. Negative for photophobia, pain, discharge, redness and itching.       Diplopia  Respiratory: Negative for chest tightness and shortness of breath.   Cardiovascular: Negative for chest pain and leg swelling.  Gastrointestinal: Negative for abdominal pain, diarrhea, constipation, blood in stool and abdominal distention.  Genitourinary: Negative for dysuria.  Musculoskeletal: Positive for arthralgias and gait problem.  Skin: Positive for color change (pallor improved) and pallor.  Neurological: Negative for dizziness, weakness and light-headedness.       Weakness improved  Psychiatric/Behavioral: Negative for confusion, sleep disturbance and agitation.    Health Maintenance  Topic  Date Due  . TETANUS/TDAP  08/07/1951  . ZOSTAVAX  08/06/1992  . PNA vac Low Risk Adult (1 of 2 - PCV13) 08/06/1997  . INFLUENZA VACCINE  04/21/2016    Physical Exam: Filed Vitals:   11/27/15 1602  BP: 112/60  Pulse: 77  Temp: 97.5 F (36.4 C)  TempSrc: Oral  Weight: 250 lb (113.399 kg)  SpO2: 96%   Body mass index is 39.15 kg/(m^2). Physical Exam  Constitutional: He is oriented to person, place, and time. He appears well-developed and well-nourished. No distress.  Eyes: EOM are normal.  Saw two of my finger when off to his right; otherwise no diplopia during exam; this also only occurred with his right eye and both eyes, not with the left alone  Cardiovascular: Normal rate, regular rhythm, normal heart sounds and intact distal pulses.   Pulmonary/Chest: Effort normal and breath sounds normal.  Abdominal: Soft. Bowel sounds are normal.  Musculoskeletal:  Short leg; walks with rollator walker  Neurological: He is alert and oriented to person, place, and time.  Skin: There is pallor.  But improved already since last week  Psychiatric: He has a normal mood and affect.    Labs reviewed: Basic Metabolic Panel:  Recent Labs  11/29/14 02/28/15  09/30/15 1028 11/04/15 1104 11/19/15 11/21/15  NA 143  --   < > 147* 145 144 144  K 4.3  --   < > 4.8 4.5 4.9 5.0  CL  --   --   --   --  108  --   --   GLUCOSE  --   --   --  100* 94  --   --   BUN  28*  --   < >  --  29* 25* 27*  CREATININE 1.2  --   < >  --  1.50* 1.6* 1.7*  TSH 7.63* 3.81  --   --   --   --  3.98  < > = values in this interval not displayed. Liver Function Tests:  Recent Labs  11/29/14 07/11/15 11/21/15  AST 19 21 21   ALT 20 25 24   ALKPHOS 91 98 100   No results for input(s): LIPASE, AMYLASE in the last 8760 hours. No results for input(s): AMMONIA in the last 8760 hours. CBC:  Recent Labs  07/11/15  11/04/15 1104 11/19/15 11/21/15  WBC 14.6  --   --  14.4 11.9  HGB 12.6*  < > 11.2* 9.8* 10.0*  HCT  39*  39*  < > 33.0* 31* 31*  PLT 505*  --   --  452* 444*  < > = values in this interval not displayed. Lipid Panel:  Recent Labs  11/29/14  CHOL 138  HDL 66  LDLCALC 62  TRIG 75   No results found for: HGBA1C  Procedures since last visit: Kub Day Of Procedure  11/04/2015  CLINICAL DATA:  Follow-up right-sided ureteral stone EXAM: ABDOMEN - 1 VIEW COMPARISON:  KUB of September 30, 2015 and abdominal CT scan of August 08, 2015. FINDINGS: There is a persistent coarse calcification adjacent to the inferior aspect of the right ureteral stent. The distal tip of the stent appears uncoiled and may not be completely within the relatively nondistended urinary bladder. Proximally no right ureteral stones are observed. There is a rim calcified structure that projects in the right upper quadrant of the abdomen consistent with a known gallstone. No left-sided stones are observed. IMPRESSION: 1. Persistent coarse calcification which may reflect a distal right ureteral stone measuring 6 x 11 mm. 2. The distal pigtail of the stent is uncoiled and may not be completely within the urinary bladder lumen. 3. No evidence of renal stones elsewhere 4. Large gallstone which is been previously demonstrated. Electronically Signed   By: David  Martinique M.D.   On: 11/04/2015 10:13    Assessment/Plan 1. Anemia, iron deficiency -cause is not clear -some of his anemia could be due to RA, but he reports he only has a little RA (unclear what that means--RF negative?)  2. Diplopia -with right eye -unclear cause -also has blurriness of eye; needs to f/u with Dr. Valetta Close -he's hoping this can be fixed b/c he does not want to give up driving and his wife doesn't like to drive  3. Weakness -has improved some since last visit--he does appear to be more energetic, but has taken the iron for less than a week at this point so doubt that's the reason  4. Rheumatoid arthritis involving multiple sites, unspecified rheumatoid factor  presence (HCC) -continues methotrexate, folic acid  5. Essential hypertension -bp well controlled on current regimen and bps also not low so should not be causing weakness -await echo report from [place]  Labs/tests ordered:  Cbc with diff, bmp before next visit Next appt:  12/25/2015   Sigurd Pugh L. Verenice Westrich, D.O. [facility] [address], [place] [address] Cell Phone (Mon-Fri 8am-5pm):  [phone] On Call:  [phone] & follow prompts after 5pm & weekends Office Phone:  [phone] Office Fax:  [phone]

## 2015-12-03 DIAGNOSIS — H26493 Other secondary cataract, bilateral: Secondary | ICD-10-CM | POA: Diagnosis not present

## 2015-12-03 DIAGNOSIS — H04123 Dry eye syndrome of bilateral lacrimal glands: Secondary | ICD-10-CM | POA: Diagnosis not present

## 2015-12-19 DIAGNOSIS — R5383 Other fatigue: Secondary | ICD-10-CM | POA: Diagnosis not present

## 2015-12-19 DIAGNOSIS — D473 Essential (hemorrhagic) thrombocythemia: Secondary | ICD-10-CM | POA: Diagnosis not present

## 2015-12-19 DIAGNOSIS — D649 Anemia, unspecified: Secondary | ICD-10-CM | POA: Diagnosis not present

## 2015-12-19 LAB — BASIC METABOLIC PANEL
BUN: 35 mg/dL — AB (ref 4–21)
CREATININE: 1.6 mg/dL — AB (ref 0.6–1.3)
Glucose: 119 mg/dL
POTASSIUM: 4.7 mmol/L (ref 3.4–5.3)
Sodium: 146 mmol/L (ref 137–147)

## 2015-12-19 LAB — CBC AND DIFFERENTIAL
HCT: 34 % — AB (ref 41–53)
HEMOGLOBIN: 10.9 g/dL — AB (ref 13.5–17.5)
PLATELETS: 371 10*3/uL (ref 150–399)
WBC: 12.6 10^3/mL

## 2015-12-19 LAB — LIPID PANEL: LDL/HDL RATIO: 12.6

## 2015-12-20 ENCOUNTER — Encounter: Payer: Self-pay | Admitting: *Deleted

## 2015-12-24 ENCOUNTER — Other Ambulatory Visit: Payer: Self-pay

## 2015-12-25 ENCOUNTER — Encounter: Payer: Self-pay | Admitting: Internal Medicine

## 2015-12-25 ENCOUNTER — Non-Acute Institutional Stay: Payer: Medicare Other | Admitting: Internal Medicine

## 2015-12-25 ENCOUNTER — Other Ambulatory Visit: Payer: Self-pay | Admitting: Internal Medicine

## 2015-12-25 VITALS — BP 126/72 | HR 135 | Temp 97.3°F | Resp 22 | Ht 67.0 in | Wt 252.4 lb

## 2015-12-25 DIAGNOSIS — I10 Essential (primary) hypertension: Secondary | ICD-10-CM

## 2015-12-25 DIAGNOSIS — M25552 Pain in left hip: Secondary | ICD-10-CM | POA: Diagnosis not present

## 2015-12-25 DIAGNOSIS — J45909 Unspecified asthma, uncomplicated: Secondary | ICD-10-CM | POA: Diagnosis not present

## 2015-12-25 DIAGNOSIS — D72829 Elevated white blood cell count, unspecified: Secondary | ICD-10-CM | POA: Diagnosis not present

## 2015-12-25 DIAGNOSIS — D509 Iron deficiency anemia, unspecified: Secondary | ICD-10-CM | POA: Diagnosis not present

## 2015-12-25 DIAGNOSIS — J449 Chronic obstructive pulmonary disease, unspecified: Secondary | ICD-10-CM

## 2015-12-25 DIAGNOSIS — D500 Iron deficiency anemia secondary to blood loss (chronic): Secondary | ICD-10-CM | POA: Insufficient documentation

## 2015-12-25 DIAGNOSIS — J4489 Other specified chronic obstructive pulmonary disease: Secondary | ICD-10-CM

## 2015-12-25 NOTE — Progress Notes (Signed)
Patient ID: Kevin Kane, male   DOB: [birth year], 82 y.o.   MRN: [id number]    Location:  [facility] (12)  Provider: Garlan Drewes L. Mariea Clonts, D.O., C.M.D.  Code Status: DNR Goals of Care:  Advanced Directives 12/25/2015  Does patient have an advance directive? Yes  Type of Advance Directive [place]  Does patient want to make changes to advanced directive? No - Patient declined  Copy of advanced directive(s) in chart? Yes     Chief Complaint  Patient presents with  . Medical Management of Chronic Issues    HPI: Patient is a 82 y.o. male seen today for medical management of chronic diseases.    Anemia is stable--hgb up to 10.9 from 10.   Continues iron Advised a recheck in [place] in one month.  WBC chronically elevated here.   Get pain in ankle and knee.  He attributes to arthritis.  Last two to three weeks, pain is much worse in left hip.  If puts full weight on it, it hurts a lot.  If he lies down it does not hurt.  Using rollator walker helps.  He's worried that his hip replacement is wearing out.  Says he will see ortho in [place].     Has picked up viral URI his wife had.  He asks how I knew he had a virus.    Has urology f/u in August in [place].  Urinary habits have changed.  Feels different when he urinates.  Has some dysuria.  Has been two months since his last evaluation.  Also has increased frequency.  Some urgency also.    Has eye appt up there for check up due to hydroxychloroquine.    Was told be eye doctor here that he may have dry eyes.  Started on otc drops.  Also could be due to prior cataract surgery.  Is avoiding driving at night.    HR was normal when I saw him--in 80s.    Past Medical History  Diagnosis Date  . Elevated prostate specific antigen (PSA) 10/10/2006  . Hyperlipidemia   . Hypertension   . Rheumatoid arthritis ([place]) 05/2013  . OA (osteoarthritis)   . First degree heart block   . Right  ureteral stone   . Nephrolithiasis     right  . History of kidney stones   . Eczema   . COPD mixed type [facility]) pulmologist-  dr Reginia Naas [facility])  note in epic under Care Everywhere tab)    per PFTs: 10/10/2013: Evidence of moderate obstructive lung disease with small airway involvement (asthma); FEV1 54% predicted, FEV1/FVC <70%, FEF25-75% is reduced. Mild response to bronchodilator; FEV1 w/ 8% change after bronchodilator.    -- and  mild intermittant asthma  . Hypothyroidism   . History of MI (myocardial infarction)     2001--  s/p  PCI and stent  . Mild intermittent asthma   . Coronary atherosclerosis of native coronary artery     cardiologist-  dr schwengel  in [place], [place] (where pt lives during spring and summer)  . GERD (gastroesophageal reflux disease)   . Frequency of urination   . Urgency of urination   . Herpes simplex of eye     bilateral eye-  takes acyclovir daily  . Wears glasses   . Wears hearing aid     bilateral  . Bilateral lower extremity edema   . At risk for sleep apnea  STOP-BANG=5      SENT TO PCP 09-25-2014    Past Surgical History  Procedure Laterality Date  . Total hip arthroplasty Left 2001  . Cataract extraction w/ intraocular lens  implant, bilateral Bilateral 2014  . Colonoscopy  2010  . Coronary angioplasty with stent placement  2001   [facility], [place])    PCI and stenting  . Tonsillectomy  as child  . Pilonidal cyst excision  2006  . Cystoscopy with retrograde pyelogram, ureteroscopy and stent placement Right 09/30/2015    Procedure: RIGHT URETEROSCOPY, RETROGRADE PYELOGRAM, LASER LITHOTRIPSY AND STENT PLACEMENT;  Surgeon: Kathie Rhodes, MD;  Location: [place];  Service: Urology;  Laterality: Right;  . Cystoscopy/ureteroscopy/holmium laser/stent placement Right 11/04/2015    Procedure: RIGHT URETEROSCOPY/RETROGRADE PYELOGRAM/HOLMIUM LASER LITHOTRIPSY/STENT PLACEMENT;  Surgeon: Kathie Rhodes, MD;  Location: [place];  Service: Urology;  Laterality: Right;    Allergies  Allergen Reactions  . Ketoconazole Other (See Comments)    Adverse skin reaction-- legs turned red and purple      Medication List       This list is accurate as of: 12/25/15  2:03 PM.  Always use your most recent med list.               acyclovir 400 MG tablet  Commonly known as:  ZOVIRAX  Take 400 mg by mouth every morning. Take one tablet daily for virus in eyes     albuterol 108 (90 Base) MCG/ACT inhaler  Commonly known as:  PROAIR HFA  Inhale 2 puffs into the lungs every 6 (six) hours as needed for wheezing or shortness of breath.     aspirin 81 MG tablet  Take 81 mg by mouth daily.     atorvastatin 80 MG tablet  Commonly known as:  LIPITOR  Take 80 mg by mouth every morning. Take one daily for cholesterol     budesonide 0.25 MG/2ML nebulizer solution  Commonly known as:  PULMICORT  Take 0.25 mg by nebulization as needed.     calcium carbonate 500 MG chewable tablet  Commonly known as:  TUMS - dosed in mg elemental calcium  Chew 1 tablet by mouth as needed for indigestion or heartburn.     CENTRUM SILVER ADULT 50+ Tabs  Take 1 tablet by mouth every morning.     CERAVE Crea  Apply topically. Apply moisturizer as needed     ciclopirox 0.77 % cream  Commonly known as:  LOPROX  Apply topically. Apply behind knee for fungus as needed     clobetasol cream 0.05 %  Commonly known as:  TEMOVATE  Apply 1 application topically. As needed for eczema     ferrous sulfate 325 (65 FE) MG tablet  Take 1 tablet (325 mg total) by mouth daily with breakfast.     fexofenadine 180 MG tablet  Commonly known as:  ALLEGRA  Take 180 mg by mouth every morning. Take one tablet daily for allergies     fluocinonide cream 0.05 %  Commonly known as:  LIDEX  Apply 1 application topically. Apply cream as needed for eczzema     hydroxychloroquine 200 MG tablet  Commonly known as:  PLAQUENIL  Take 200 mg by mouth every  morning.     ipratropium-albuterol 0.5-2.5 (3) MG/3ML Soln  Commonly known as:  DUONEB  Take 3 mLs by nebulization every 6 (six) hours as needed. Use in nebulizer every 6 hours in emergency     levothyroxine 25  MCG tablet  Commonly known as:  SYNTHROID, LEVOTHROID  Take 25 mcg by mouth daily before breakfast.     levothyroxine 50 MCG tablet  Commonly known as:  SYNTHROID, LEVOTHROID  TAKE 1 TABLET BY MOUTH DAILY     metoprolol succinate 50 MG 24 hr tablet  Commonly known as:  TOPROL-XL  Take 50 mg by mouth every morning. Take one tablet daily for blood pressure     nitroGLYCERIN 0.4 MG SL tablet  Commonly known as:  NITROSTAT  Place 0.4 mg under the tongue every 5 (five) minutes as needed for chest pain.     phenazopyridine 200 MG tablet  Commonly known as:  PYRIDIUM  Take 1 tablet (200 mg total) by mouth 3 (three) times daily as needed for pain.     tamsulosin 0.4 MG Caps capsule  Commonly known as:  FLOMAX  Take 0.4 mg by mouth daily after breakfast. Reported on 11/14/2015     tolnaftate 1 % spray  Commonly known as:  TINACTIN  Apply topically. Spray for fungus as needed     triamcinolone cream 0.1 %  Commonly known as:  KENALOG  Apply 1 application topically. Apply for eczema behind ear and belly button as needed        Review of Systems:  Review of Systems  HENT: Positive for congestion and sinus pressure. Negative for sore throat.   Respiratory: Positive for shortness of breath. Negative for chest tightness.   Cardiovascular: Negative for chest pain, palpitations and leg swelling.  Gastrointestinal: Negative for nausea, vomiting, abdominal pain, diarrhea and constipation.  Genitourinary: Positive for dysuria and frequency. Negative for urgency and enuresis.  Musculoskeletal: Positive for back pain, arthralgias and gait problem.  Skin: Positive for pallor.  Neurological: Negative for dizziness and weakness.  Hematological: Negative for adenopathy.    Psychiatric/Behavioral: Negative for confusion.       Denies depression    Health Maintenance  Topic Date Due  . TETANUS/TDAP  08/07/1951  . ZOSTAVAX  08/06/1992  . PNA vac Low Risk Adult (1 of 2 - PCV13) 08/06/1997  . INFLUENZA VACCINE  04/21/2016    Physical Exam: Filed Vitals:   12/25/15 1343  BP: 126/72  Pulse: 135  Temp: 97.3 F (36.3 C)  TempSrc: Oral  Resp: 22  Height: 5\' 7"  (1.702 m)  Weight: 252 lb 6.4 oz (114.488 kg)  SpO2: 95%   Body mass index is 39.52 kg/(m^2). Physical Exam  Constitutional: He is oriented to person, place, and time. He appears well-nourished. No distress.  Cardiovascular: Normal rate, regular rhythm and normal heart sounds.   Abdominal: Bowel sounds are normal.  Musculoskeletal:  Stooped posture; limps with rollator walker--short leg; has tenderness in her ischial region of his right buttock when bearing weight  Neurological: He is alert and oriented to person, place, and time.  Skin: Skin is warm and dry.  Legs with erythema of chronic venous insufficiency R>L  Psychiatric: He has a normal mood and affect.    Labs reviewed: Basic Metabolic Panel:  Recent Labs  02/28/15  09/30/15 1028 11/04/15 1104 11/19/15 11/21/15 12/19/15  NA  --   < > 147* 145 144 144 146  K  --   < > 4.8 4.5 4.9 5.0 4.7  CL  --   --   --  108  --   --   --   GLUCOSE  --   --  100* 94  --   --   --  BUN  --   < >  --  29* 25* 27* 35*  CREATININE  --   < >  --  1.50* 1.6* 1.7* 1.6*  TSH 3.81  --   --   --   --  3.98  --   < > = values in this interval not displayed. Liver Function Tests:  Recent Labs  07/11/15 11/21/15  AST 21 21  ALT 25 24  ALKPHOS 98 100   No results for input(s): LIPASE, AMYLASE in the last 8760 hours. No results for input(s): AMMONIA in the last 8760 hours. CBC:  Recent Labs  11/19/15 11/21/15 12/19/15  WBC 14.4 11.9 12.6  HGB 9.8* 10.0* 10.9*  HCT 31* 31* 34*  PLT 452* 444* 371    Assessment/Plan 1. Left hip pain -pt  is concerned there is a problem with his prosthesis from his prior hip surgery -does not want anything done here and will see ortho in [place] when he gets there  2. Leukocytosis -this has been ongoing all through the winter since I've been seeing him along with iron deficiency anemia  -would prefer he see hematology also due to these abnormalities  3. Anemia, iron deficiency -cont iron daily  4. Essential hypertension -bp at goal with current regimen  5. Chronic obstructive airway disease with asthma (HCC) -stable, cont albuterol, pulmicort, duonebs prn -conservative otc mgt of current URI which is likely viral  Labs/tests ordered:  No new, given copy of his last labs to share with PCP in [place]--counseled on importance of early f/u due to his multiple ongoing lab abnormalities and "not being a specimen of health" overall  Next appt:  When returns from [place]. Sorah Falkenstein, D.O. [facility] [address], [place] [address] Cell Phone (Mon-Fri 8am-5pm):  [phone] On Call:  [phone] & follow prompts after 5pm & weekends Office Phone:  [phone] Office Fax:  [phone]

## 2015-12-25 NOTE — Patient Instructions (Signed)
Take 1-2 tylenol extra strength per day if your hip is hurting.

## 2015-12-26 ENCOUNTER — Encounter: Payer: Self-pay | Admitting: Internal Medicine

## 2016-01-01 DIAGNOSIS — Z0000 Encounter for general adult medical examination without abnormal findings: Secondary | ICD-10-CM | POA: Diagnosis not present

## 2016-01-01 DIAGNOSIS — R300 Dysuria: Secondary | ICD-10-CM | POA: Diagnosis not present

## 2016-01-01 DIAGNOSIS — N1330 Unspecified hydronephrosis: Secondary | ICD-10-CM | POA: Diagnosis not present

## 2016-01-01 DIAGNOSIS — N390 Urinary tract infection, site not specified: Secondary | ICD-10-CM | POA: Diagnosis not present

## 2016-01-13 DIAGNOSIS — M2550 Pain in unspecified joint: Secondary | ICD-10-CM | POA: Diagnosis not present

## 2016-01-13 DIAGNOSIS — D649 Anemia, unspecified: Secondary | ICD-10-CM | POA: Diagnosis not present

## 2016-01-15 DIAGNOSIS — H35363 Drusen (degenerative) of macula, bilateral: Secondary | ICD-10-CM | POA: Diagnosis not present

## 2016-01-15 DIAGNOSIS — H26491 Other secondary cataract, right eye: Secondary | ICD-10-CM | POA: Diagnosis not present

## 2016-01-23 DIAGNOSIS — H26491 Other secondary cataract, right eye: Secondary | ICD-10-CM | POA: Diagnosis not present

## 2016-01-24 DIAGNOSIS — R350 Frequency of micturition: Secondary | ICD-10-CM | POA: Diagnosis not present

## 2016-01-24 DIAGNOSIS — N201 Calculus of ureter: Secondary | ICD-10-CM | POA: Diagnosis not present

## 2016-01-24 DIAGNOSIS — N390 Urinary tract infection, site not specified: Secondary | ICD-10-CM | POA: Diagnosis not present

## 2016-01-29 DIAGNOSIS — H4912 Fourth [trochlear] nerve palsy, left eye: Secondary | ICD-10-CM | POA: Diagnosis not present

## 2016-02-06 DIAGNOSIS — D692 Other nonthrombocytopenic purpura: Secondary | ICD-10-CM | POA: Diagnosis not present

## 2016-02-06 DIAGNOSIS — L578 Other skin changes due to chronic exposure to nonionizing radiation: Secondary | ICD-10-CM | POA: Diagnosis not present

## 2016-02-06 DIAGNOSIS — D485 Neoplasm of uncertain behavior of skin: Secondary | ICD-10-CM | POA: Diagnosis not present

## 2016-02-06 DIAGNOSIS — L988 Other specified disorders of the skin and subcutaneous tissue: Secondary | ICD-10-CM | POA: Diagnosis not present

## 2016-02-06 DIAGNOSIS — L570 Actinic keratosis: Secondary | ICD-10-CM | POA: Diagnosis not present

## 2016-02-06 DIAGNOSIS — L821 Other seborrheic keratosis: Secondary | ICD-10-CM | POA: Diagnosis not present

## 2016-02-12 DIAGNOSIS — D692 Other nonthrombocytopenic purpura: Secondary | ICD-10-CM | POA: Diagnosis not present

## 2016-02-12 DIAGNOSIS — L304 Erythema intertrigo: Secondary | ICD-10-CM | POA: Diagnosis not present

## 2016-03-05 DIAGNOSIS — H4912 Fourth [trochlear] nerve palsy, left eye: Secondary | ICD-10-CM | POA: Diagnosis not present

## 2016-03-05 DIAGNOSIS — H26491 Other secondary cataract, right eye: Secondary | ICD-10-CM | POA: Diagnosis not present

## 2016-03-11 DIAGNOSIS — H4912 Fourth [trochlear] nerve palsy, left eye: Secondary | ICD-10-CM | POA: Diagnosis not present

## 2016-03-12 ENCOUNTER — Other Ambulatory Visit: Payer: Self-pay | Admitting: Internal Medicine

## 2016-04-21 DIAGNOSIS — H532 Diplopia: Secondary | ICD-10-CM | POA: Diagnosis not present

## 2016-04-22 DIAGNOSIS — N401 Enlarged prostate with lower urinary tract symptoms: Secondary | ICD-10-CM | POA: Diagnosis not present

## 2016-04-22 DIAGNOSIS — R972 Elevated prostate specific antigen [PSA]: Secondary | ICD-10-CM | POA: Diagnosis not present

## 2016-04-22 DIAGNOSIS — R350 Frequency of micturition: Secondary | ICD-10-CM | POA: Diagnosis not present

## 2016-04-22 DIAGNOSIS — N201 Calculus of ureter: Secondary | ICD-10-CM | POA: Diagnosis not present

## 2016-05-11 DIAGNOSIS — R350 Frequency of micturition: Secondary | ICD-10-CM | POA: Diagnosis not present

## 2016-05-11 DIAGNOSIS — N401 Enlarged prostate with lower urinary tract symptoms: Secondary | ICD-10-CM | POA: Diagnosis not present

## 2016-05-11 DIAGNOSIS — Z112 Encounter for screening for other bacterial diseases: Secondary | ICD-10-CM | POA: Diagnosis not present

## 2016-05-11 DIAGNOSIS — R972 Elevated prostate specific antigen [PSA]: Secondary | ICD-10-CM | POA: Diagnosis not present

## 2016-05-11 DIAGNOSIS — Z119 Encounter for screening for infectious and parasitic diseases, unspecified: Secondary | ICD-10-CM | POA: Diagnosis not present

## 2016-05-11 DIAGNOSIS — N201 Calculus of ureter: Secondary | ICD-10-CM | POA: Diagnosis not present

## 2016-05-12 DIAGNOSIS — N289 Disorder of kidney and ureter, unspecified: Secondary | ICD-10-CM | POA: Diagnosis not present

## 2016-05-12 DIAGNOSIS — E6609 Other obesity due to excess calories: Secondary | ICD-10-CM | POA: Diagnosis not present

## 2016-05-12 DIAGNOSIS — I252 Old myocardial infarction: Secondary | ICD-10-CM | POA: Diagnosis not present

## 2016-05-12 DIAGNOSIS — I2510 Atherosclerotic heart disease of native coronary artery without angina pectoris: Secondary | ICD-10-CM | POA: Diagnosis not present

## 2016-05-12 DIAGNOSIS — Z955 Presence of coronary angioplasty implant and graft: Secondary | ICD-10-CM | POA: Diagnosis not present

## 2016-05-12 DIAGNOSIS — E7800 Pure hypercholesterolemia, unspecified: Secondary | ICD-10-CM | POA: Diagnosis not present

## 2016-05-12 DIAGNOSIS — I6523 Occlusion and stenosis of bilateral carotid arteries: Secondary | ICD-10-CM | POA: Diagnosis not present

## 2016-05-18 DIAGNOSIS — R972 Elevated prostate specific antigen [PSA]: Secondary | ICD-10-CM | POA: Diagnosis not present

## 2016-05-18 DIAGNOSIS — C61 Malignant neoplasm of prostate: Secondary | ICD-10-CM | POA: Diagnosis not present

## 2016-05-21 DIAGNOSIS — Z23 Encounter for immunization: Secondary | ICD-10-CM | POA: Diagnosis not present

## 2016-05-28 DIAGNOSIS — E7800 Pure hypercholesterolemia, unspecified: Secondary | ICD-10-CM | POA: Diagnosis not present

## 2016-05-28 DIAGNOSIS — I6523 Occlusion and stenosis of bilateral carotid arteries: Secondary | ICD-10-CM | POA: Diagnosis not present

## 2016-05-28 DIAGNOSIS — I2510 Atherosclerotic heart disease of native coronary artery without angina pectoris: Secondary | ICD-10-CM | POA: Diagnosis not present

## 2016-05-29 DIAGNOSIS — H4912 Fourth [trochlear] nerve palsy, left eye: Secondary | ICD-10-CM | POA: Diagnosis not present

## 2016-06-01 DIAGNOSIS — C61 Malignant neoplasm of prostate: Secondary | ICD-10-CM | POA: Diagnosis not present

## 2016-06-01 DIAGNOSIS — R350 Frequency of micturition: Secondary | ICD-10-CM | POA: Diagnosis not present

## 2016-06-01 DIAGNOSIS — N401 Enlarged prostate with lower urinary tract symptoms: Secondary | ICD-10-CM | POA: Diagnosis not present

## 2016-06-01 DIAGNOSIS — R972 Elevated prostate specific antigen [PSA]: Secondary | ICD-10-CM | POA: Diagnosis not present

## 2016-06-01 DIAGNOSIS — N201 Calculus of ureter: Secondary | ICD-10-CM | POA: Diagnosis not present

## 2016-06-05 DIAGNOSIS — N132 Hydronephrosis with renal and ureteral calculous obstruction: Secondary | ICD-10-CM | POA: Diagnosis not present

## 2016-06-05 DIAGNOSIS — M899 Disorder of bone, unspecified: Secondary | ICD-10-CM | POA: Diagnosis not present

## 2016-06-05 DIAGNOSIS — K5730 Diverticulosis of large intestine without perforation or abscess without bleeding: Secondary | ICD-10-CM | POA: Diagnosis not present

## 2016-06-05 DIAGNOSIS — N2889 Other specified disorders of kidney and ureter: Secondary | ICD-10-CM | POA: Diagnosis not present

## 2016-06-05 DIAGNOSIS — C61 Malignant neoplasm of prostate: Secondary | ICD-10-CM | POA: Diagnosis not present

## 2016-06-05 DIAGNOSIS — K8020 Calculus of gallbladder without cholecystitis without obstruction: Secondary | ICD-10-CM | POA: Diagnosis not present

## 2016-06-08 DIAGNOSIS — R972 Elevated prostate specific antigen [PSA]: Secondary | ICD-10-CM | POA: Diagnosis not present

## 2016-06-08 DIAGNOSIS — R350 Frequency of micturition: Secondary | ICD-10-CM | POA: Diagnosis not present

## 2016-06-08 DIAGNOSIS — N401 Enlarged prostate with lower urinary tract symptoms: Secondary | ICD-10-CM | POA: Diagnosis not present

## 2016-06-08 DIAGNOSIS — C61 Malignant neoplasm of prostate: Secondary | ICD-10-CM | POA: Diagnosis not present

## 2016-06-10 ENCOUNTER — Telehealth: Payer: Self-pay | Admitting: Oncology

## 2016-06-10 DIAGNOSIS — Z08 Encounter for follow-up examination after completed treatment for malignant neoplasm: Secondary | ICD-10-CM | POA: Diagnosis not present

## 2016-06-10 DIAGNOSIS — D1801 Hemangioma of skin and subcutaneous tissue: Secondary | ICD-10-CM | POA: Diagnosis not present

## 2016-06-10 DIAGNOSIS — Z85828 Personal history of other malignant neoplasm of skin: Secondary | ICD-10-CM | POA: Diagnosis not present

## 2016-06-10 DIAGNOSIS — X32XXXA Exposure to sunlight, initial encounter: Secondary | ICD-10-CM | POA: Diagnosis not present

## 2016-06-10 DIAGNOSIS — L821 Other seborrheic keratosis: Secondary | ICD-10-CM | POA: Diagnosis not present

## 2016-06-10 DIAGNOSIS — L570 Actinic keratosis: Secondary | ICD-10-CM | POA: Diagnosis not present

## 2016-06-10 NOTE — Telephone Encounter (Signed)
Spoke with pt regarding scheduling appt for 10/5, completed intake, pt will bring in records/scans in to appt.

## 2016-06-18 ENCOUNTER — Telehealth: Payer: Self-pay | Admitting: Oncology

## 2016-06-18 ENCOUNTER — Ambulatory Visit (HOSPITAL_BASED_OUTPATIENT_CLINIC_OR_DEPARTMENT_OTHER): Payer: Medicare Other | Admitting: Oncology

## 2016-06-18 VITALS — BP 154/57 | HR 77 | Temp 97.7°F | Resp 18 | Ht 67.0 in | Wt 257.8 lb

## 2016-06-18 DIAGNOSIS — C61 Malignant neoplasm of prostate: Secondary | ICD-10-CM

## 2016-06-18 DIAGNOSIS — J449 Chronic obstructive pulmonary disease, unspecified: Secondary | ICD-10-CM | POA: Diagnosis not present

## 2016-06-18 DIAGNOSIS — E039 Hypothyroidism, unspecified: Secondary | ICD-10-CM

## 2016-06-18 DIAGNOSIS — I10 Essential (primary) hypertension: Secondary | ICD-10-CM

## 2016-06-18 DIAGNOSIS — M069 Rheumatoid arthritis, unspecified: Secondary | ICD-10-CM | POA: Diagnosis not present

## 2016-06-18 NOTE — Telephone Encounter (Signed)
GAVE PATIENT APPOINTMENT FOR 10/9 WITH DR. MANNING PER 9/28 LOS NO F/U WITH DR SHADAD.

## 2016-06-18 NOTE — Progress Notes (Signed)
Reason for Referral: Prostate cancer  HPI: This is a pleasant 82 year old gentleman native of [place] currently lives in a senior living facility in [place]. He is a gentleman with history of COPD as well as hypertension and hyperlipidemia. He also has history of a nephrolithiasis and follows with urology in [place] where he spends the summers. Most recently he was noted to have an elevated PSA with a PSA up to 15.9 in August 2017. His previous PSA was around between 8 and 9 previously. He did have asked some episodes of nocturia and frequency but no major progressive lower urinary tract symptoms. He underwent a prostate biopsy on 05/22/2016 which showed a high volume high risk disease with Gleason score of 4+5 = 9 as well as Gleason score 4+4 = 8 and 11 out of 12 cores. His clinical stage was T2c. He received a Lupron at a dose of 22.5 mg IM on 06/08/2016. He was also started on Casodex. He tolerated therapy fairly well and was referred to radiation oncology in [place] but since he is moving back to [place] for the winter, he prefers to get further treatment for his prostate cancer locally. Staging workup including CT scan of the abdomen did not show any evidence of metastatic disease but did have hydroureteronephrosis on the distal right ureter with 2 stones noted. His bone scan did not show any evidence of metastatic disease.  Clinically, he feels reasonably well without any major complaints. He does have multiple comorbid conditions as well as rheumatoid arthritis and currently is on Plaquenil. He does ambulate with the help of a cane but does not have any falls or syncope. He does not report any pathological fractures or bone pain.   He does not report any headaches, blurry vision, syncope or seizures. He does not report any fevers, chills, sweats or weight loss. He isn't report any chest pain, palpitation, orthopnea or leg edema. He does not report any cough, wheezing or hemoptysis. He  does not report any nausea, vomiting or abdominal pain. He does not report any frequency urgency or hesitancy. He does not report any hematuria or dysuria. He does not report any skeletal complaints. Remaining review of systems unremarkable.   Past Medical History:  Diagnosis Date  . At risk for sleep apnea    STOP-BANG=5      SENT TO PCP 09-25-2014  . Bilateral lower extremity edema   . COPD mixed type [facility]) pulmologist-  dr Reginia Naas [facility])  note in epic under Care Everywhere tab)   per PFTs: 10/10/2013: Evidence of moderate obstructive lung disease with small airway involvement (asthma); FEV1 54% predicted, FEV1/FVC <70%, FEF25-75% is reduced. Mild response to bronchodilator; FEV1 w/ 8% change after bronchodilator.    -- and  mild intermittant asthma  . Coronary atherosclerosis of native coronary artery    cardiologist-  dr schwengel  in [place], [place] (where pt lives during spring and summer)  . Eczema   . Elevated prostate specific antigen (PSA) 10/10/2006  . First degree heart block   . Frequency of urination   . GERD (gastroesophageal reflux disease)   . Herpes simplex of eye    bilateral eye-  takes acyclovir daily  . History of kidney stones   . History of MI (myocardial infarction)    2001--  s/p  PCI and stent  . Hyperlipidemia   . Hypertension   . Hypothyroidism   . Mild intermittent asthma   . Nephrolithiasis    right  .  OA (osteoarthritis)   . Rheumatoid arthritis ([place]) 05/2013  . Right ureteral stone   . Urgency of urination   . Wears glasses   . Wears hearing aid    bilateral  :  Past Surgical History:  Procedure Laterality Date  . CATARACT EXTRACTION W/ INTRAOCULAR LENS  IMPLANT, BILATERAL Bilateral 2014  . COLONOSCOPY  2010  . CORONARY ANGIOPLASTY WITH STENT PLACEMENT  2001   ([address], [place])   PCI and stenting  . CYSTOSCOPY WITH RETROGRADE PYELOGRAM, URETEROSCOPY AND STENT PLACEMENT Right 09/30/2015   Procedure: RIGHT URETEROSCOPY, RETROGRADE PYELOGRAM,  LASER LITHOTRIPSY AND STENT PLACEMENT;  Surgeon: Kathie Rhodes, MD;  Location: [facility];  Service: Urology;  Laterality: Right;  . CYSTOSCOPY/URETEROSCOPY/HOLMIUM LASER/STENT PLACEMENT Right 11/04/2015   Procedure: RIGHT URETEROSCOPY/RETROGRADE PYELOGRAM/HOLMIUM LASER LITHOTRIPSY/STENT PLACEMENT;  Surgeon: Kathie Rhodes, MD;  Location: [facility];  Service: Urology;  Laterality: Right;  . PILONIDAL CYST EXCISION  2006  . TONSILLECTOMY  as child  . TOTAL HIP ARTHROPLASTY Left 2001  :   Current Outpatient Prescriptions:  .  acyclovir (ZOVIRAX) 400 MG tablet, Take 400 mg by mouth every morning. Take one tablet daily for virus in eyes, Disp: , Rfl:  .  albuterol (PROAIR HFA) 108 (90 BASE) MCG/ACT inhaler, Inhale 2 puffs into the lungs every 6 (six) hours as needed for wheezing or shortness of breath., Disp: 1 Inhaler, Rfl: 3 .  aspirin 81 MG tablet, Take 81 mg by mouth daily., Disp: , Rfl:  .  atorvastatin (LIPITOR) 80 MG tablet, Take 80 mg by mouth every morning. Take one daily for cholesterol, Disp: , Rfl:  .  budesonide (PULMICORT) 0.25 MG/2ML nebulizer solution, Take 0.25 mg by nebulization as needed. , Disp: , Rfl:  .  calcium carbonate (TUMS - DOSED IN MG ELEMENTAL CALCIUM) 500 MG chewable tablet, Chew 1 tablet by mouth as needed for indigestion or heartburn., Disp: , Rfl:  .  ciclopirox (LOPROX) 0.77 % cream, Apply topically. Apply behind knee for fungus as needed, Disp: , Rfl:  .  clobetasol cream (TEMOVATE) AB-123456789 %, Apply 1 application topically. As needed for eczema, Disp: , Rfl:  .  Emollient (CERAVE) CREA, Apply topically. Apply moisturizer as needed, Disp: , Rfl:  .  ferrous sulfate 325 (65 FE) MG tablet, TAKE 1 TABLET BY MOUTH DAILY WITH BREAKFAST, Disp: 30 tablet, Rfl: 3 .  fexofenadine (ALLEGRA) 180 MG tablet, Take 180 mg by mouth every morning. Take one tablet daily for allergies, Disp: , Rfl:  .  fluocinonide cream (LIDEX) AB-123456789 %, Apply 1 application  topically. Apply cream as needed for eczzema, Disp: , Rfl:  .  hydroxychloroquine (PLAQUENIL) 200 MG tablet, Take 200 mg by mouth every morning. , Disp: , Rfl:  .  ipratropium-albuterol (DUONEB) 0.5-2.5 (3) MG/3ML SOLN, Take 3 mLs by nebulization every 6 (six) hours as needed. Use in nebulizer every 6 hours in emergency, Disp: , Rfl:  .  levothyroxine (SYNTHROID, LEVOTHROID) 25 MCG tablet, Take 25 mcg by mouth daily before breakfast., Disp: , Rfl:  .  levothyroxine (SYNTHROID, LEVOTHROID) 50 MCG tablet, TAKE 1 TABLET BY MOUTH DAILY, Disp: 90 tablet, Rfl: 1 .  metoprolol succinate (TOPROL-XL) 50 MG 24 hr tablet, Take 50 mg by mouth every morning. Take one tablet daily for blood pressure, Disp: , Rfl:  .  Multiple Vitamins-Minerals (CENTRUM SILVER ADULT 50+) TABS, Take 1 tablet by mouth every morning., Disp: , Rfl:  .  nitroGLYCERIN (NITROSTAT) 0.4 MG SL tablet, Place 0.4  mg under the tongue every 5 (five) minutes as needed for chest pain., Disp: , Rfl:  .  phenazopyridine (PYRIDIUM) 200 MG tablet, Take 1 tablet (200 mg total) by mouth 3 (three) times daily as needed for pain., Disp: 30 tablet, Rfl: 0 .  tamsulosin (FLOMAX) 0.4 MG CAPS capsule, Take 0.4 mg by mouth daily after breakfast. Reported on 11/14/2015, Disp: , Rfl:  .  tolnaftate (TINACTIN) 1 % spray, Apply topically. Spray for fungus as needed, Disp: , Rfl:  .  triamcinolone cream (KENALOG) 0.1 %, Apply 1 application topically. Apply for eczema behind ear and belly button as needed, Disp: , Rfl: :  Allergies  Allergen Reactions  . Ketoconazole Other (See Comments)    Adverse skin reaction-- legs turned red and purple  :  Family History  Problem Relation Age of Onset  . Stroke Mother   :  Social History   Social History  . Marital status: Married    Spouse name: N/A  . Number of children: N/A  . Years of education: N/A   Occupational History  . retired Programme researcher, broadcasting/film/video     Social History Main Topics  . Smoking status: Former Smoker     Years: 15.00    Types: Cigarettes    Quit date: 09/21/1962  . Smokeless tobacco: Never Used  . Alcohol use 3.0 oz/week    5 Glasses of wine per week  . Drug use: No  . Sexual activity: Not on file   Other Topics Concern  . Not on file   Social History Narrative   Patient is Married since 1955. Retired Programme researcher, broadcasting/film/video. Lives in single level home, Independent Living section at [facility] since 2013.  Spends half the year at home in [place].    Stopped smoking 1964, Moderate alcohol intake, 5 glasses wine/ week    Minimal exercise, walking. Has pet cat.   Patient has no Advanced planning documents           :  Pertinent items are noted in HPI.  Exam: Blood pressure (!) 154/57, pulse 77, temperature 97.7 F (36.5 C), temperature source Oral, resp. rate 18, height 5\' 7"  (1.702 m), weight 257 lb 12.8 oz (116.9 kg), SpO2 96 %.  ECOG 0 General appearance: alert and cooperative. Not any distress.  Head: Normocephalic, without obvious abnormality Throat: lips, mucosa, and tongue normal; teeth and gums normal Neck: no adenopathy Back: negative Resp: clear to auscultation bilaterally.No rhonchi, wheezes or dullness to percussion. Cardio: regular rate and rhythm, S1, S2 normal, no murmur, click, rub or gallop GI: soft, non-tender; bowel sounds normal; no masses,  no organomegaly Extremities: extremities normal, atraumatic, no cyanosis or edema   CBC    Component Value Date/Time   WBC 12.6 12/19/2015   HGB 10.9 (A) 12/19/2015   HCT 34 (A) 12/19/2015   PLT 371 12/19/2015      Chemistry      Component Value Date/Time   NA 146 12/19/2015   K 4.7 12/19/2015   CL 108 11/04/2015 1104   BUN 35 (A) 12/19/2015   CREATININE 1.6 (A) 12/19/2015   CREATININE 1.50 (H) 11/04/2015 1104   GLU 119 12/19/2015      Component Value Date/Time   ALKPHOS 100 11/21/2015   AST 21 11/21/2015   ALT 24 11/21/2015       Assessment and Plan:   82 year old gentleman with  the following issues:  1. Prostate cancer diagnosed in August 2017. He presented with a PSA of 15.9  and clinical stage TIIc. His prostate biopsy showed a Gleason score 4+5 = 9 with pattern 4+4 = 8 also noted. 11 out of 12 cores were involved with cancer. Staging workup did not show any evidence of metastatic disease and he has very little urinary tract symptoms at this time.  The natural course of high risk prostate cancer was discussed especially in the setting of no metastatic disease. He would be a good candidate for combined androgen deprivation therapy and definitive radiation therapy. Primary surgical therapy might not be his best option given his age, comorbid conditions and the nature of his cancer.  He already started on androgen deprivation therapy and I anticipate he will need to be on it for at least 24 months. I will refer him to radiation oncology for discussion regarding definitive therapy.  I discussed with him the role of other systemic therapy options including second line hormone therapy, immunotherapy and systemic chemotherapy. The options are available he developed advanced disease which certainly a possibility given his high-risk features.  All his questions were answered today to his satisfaction.  2. Androgen depravation: His last Lupron was given on 06/08/2016 for a total of 22.5 mg. He is scheduled to follow with urology locally and will see Dr. Gaynelle Arabian in the near future. I assume he will continue androgen deprivation under his care. I am more than happy to give him any further Lupron injections if he prefers.  3. Follow-up: I'm happy to see him in the future as needed.

## 2016-06-23 ENCOUNTER — Other Ambulatory Visit: Payer: Self-pay | Admitting: Internal Medicine

## 2016-06-24 DIAGNOSIS — C61 Malignant neoplasm of prostate: Secondary | ICD-10-CM | POA: Diagnosis not present

## 2016-06-24 DIAGNOSIS — N202 Calculus of kidney with calculus of ureter: Secondary | ICD-10-CM | POA: Diagnosis not present

## 2016-06-24 DIAGNOSIS — N1330 Unspecified hydronephrosis: Secondary | ICD-10-CM | POA: Diagnosis not present

## 2016-06-25 ENCOUNTER — Ambulatory Visit: Payer: Medicare Other | Admitting: Oncology

## 2016-06-25 NOTE — Progress Notes (Signed)
GU Location of Tumor / Histology: Adenocarcinoma of the Prostate  If Prostate Cancer, Gleason Score is (4 + 5) PSA (15.9)  Kevin Kane presented with elevated PSA of 15.9 in August 2017  Biopsies of Prostate [id number] (if applicable) revealed:   Past/Anticipated interventions by urology, if any:  Biopsy of Prostate, Lupron 22.5mg  IM on  06/08/2016 and has started Casodex.  CT evidence of hydroureteronephrosis on the right distal ureter with 2 stones. No evidence of metastatic disease found.  Past/Anticipated interventions by medical oncology, if any: No intervention at this time  Weight changes, if any: no  Bowel/Bladder complaints, if any: Episodic periods of nocturia, frequency, urgency and leakage. Denies dysuria or hematuria.   Nausea/Vomiting, if any: no  Pain issues, if any:  no  SAFETY ISSUES:  Prior radiation? no  Pacemaker/ICD? no  Possible current pregnancy? N/A  Is the patient on methotrexate? No   Current Complaints / other details:  82 year old male. Married. Denies family history of cancer.

## 2016-06-29 ENCOUNTER — Encounter: Payer: Self-pay | Admitting: Radiation Oncology

## 2016-06-29 ENCOUNTER — Ambulatory Visit
Admission: RE | Admit: 2016-06-29 | Discharge: 2016-06-29 | Disposition: A | Discharge status: Home / Self Care | Payer: Medicare Other | Source: Ambulatory Visit | Attending: Radiation Oncology | Admitting: Radiation Oncology

## 2016-06-29 DIAGNOSIS — I252 Old myocardial infarction: Secondary | ICD-10-CM | POA: Diagnosis not present

## 2016-06-29 DIAGNOSIS — C61 Malignant neoplasm of prostate: Secondary | ICD-10-CM | POA: Insufficient documentation

## 2016-06-29 DIAGNOSIS — Z823 Family history of stroke: Secondary | ICD-10-CM | POA: Insufficient documentation

## 2016-06-29 DIAGNOSIS — K219 Gastro-esophageal reflux disease without esophagitis: Secondary | ICD-10-CM | POA: Insufficient documentation

## 2016-06-29 DIAGNOSIS — M069 Rheumatoid arthritis, unspecified: Secondary | ICD-10-CM | POA: Diagnosis not present

## 2016-06-29 DIAGNOSIS — Z79899 Other long term (current) drug therapy: Secondary | ICD-10-CM | POA: Diagnosis not present

## 2016-06-29 DIAGNOSIS — Z87442 Personal history of urinary calculi: Secondary | ICD-10-CM | POA: Insufficient documentation

## 2016-06-29 DIAGNOSIS — Z87891 Personal history of nicotine dependence: Secondary | ICD-10-CM | POA: Insufficient documentation

## 2016-06-29 DIAGNOSIS — Z955 Presence of coronary angioplasty implant and graft: Secondary | ICD-10-CM | POA: Insufficient documentation

## 2016-06-29 DIAGNOSIS — Z888 Allergy status to other drugs, medicaments and biological substances status: Secondary | ICD-10-CM | POA: Insufficient documentation

## 2016-06-29 DIAGNOSIS — E039 Hypothyroidism, unspecified: Secondary | ICD-10-CM | POA: Diagnosis not present

## 2016-06-29 DIAGNOSIS — E785 Hyperlipidemia, unspecified: Secondary | ICD-10-CM | POA: Insufficient documentation

## 2016-06-29 DIAGNOSIS — J4520 Mild intermittent asthma, uncomplicated: Secondary | ICD-10-CM | POA: Diagnosis not present

## 2016-06-29 DIAGNOSIS — Z7951 Long term (current) use of inhaled steroids: Secondary | ICD-10-CM | POA: Insufficient documentation

## 2016-06-29 DIAGNOSIS — M1990 Unspecified osteoarthritis, unspecified site: Secondary | ICD-10-CM | POA: Diagnosis not present

## 2016-06-29 DIAGNOSIS — Z510 Encounter for antineoplastic radiation therapy: Secondary | ICD-10-CM | POA: Insufficient documentation

## 2016-06-29 DIAGNOSIS — Z7982 Long term (current) use of aspirin: Secondary | ICD-10-CM | POA: Insufficient documentation

## 2016-06-29 DIAGNOSIS — I10 Essential (primary) hypertension: Secondary | ICD-10-CM | POA: Insufficient documentation

## 2016-06-29 DIAGNOSIS — Z79818 Long term (current) use of other agents affecting estrogen receptors and estrogen levels: Secondary | ICD-10-CM | POA: Diagnosis not present

## 2016-06-29 HISTORY — DX: Malignant neoplasm of prostate: C61

## 2016-06-29 NOTE — Progress Notes (Signed)
Radiation Oncology         [phone] ________________________________  Initial Outpatient Consultation  Name: Kevin Kane MRN: [id number]         Date: 06/29/2016                      DOB: [birth year]  [id number], TIFFANY, DO  Shadad, Mathis Dad, MD   REFERRING PHYSICIAN: Wyatt Portela, MD  DIAGNOSIS: The encounter diagnosis was Malignant neoplasm of prostate ([place]).    ICD-9-CM ICD-10-CM   1. Malignant neoplasm of prostate ([facility]) 185 C61     HISTORY OF PRESENT ILLNESS: Kevin Kane is a 82 y.o. male seen at the request of Dr. Gaynelle Arabian and Dr. Alen Blew for a diagnosis of prostate cancer. He has been followed for many years with BPH and has had a PSA of greater than 4 since 1997.  He was found to have an elevated PSA of 15.9 in August 2017. He reports at that time, he was having symptoms of nocturia and frequency but no major progressive urinary tract symptoms. He underwent a prostate biopsy on 05/22/2016. This revealed a high volume, high risk disease with Gleason score 9 (4+5) as well as Gleason score 8 (4+4) and 11 out of 12 cores. His clinical stage was T2c. He received a Lupron at a dose of 22.5 mg IM on 06/08/2016. He also started Casodex. He has tolerated therapy fairly well. Staging workup including CT scan of the abdomen did not show any evidence of metastatic disease but did have hydroureteronephrosis on the distal right ureter with 2 stones noted, and has subsequently undergone stenting. His bone scan did not show any evidence of metastatic disease. He comes today to discuss options for radiotherapy.   PREVIOUS RADIATION THERAPY: No  PAST MEDICAL HISTORY:      Past Medical History:  Diagnosis Date  . At risk for sleep apnea    STOP-BANG=5      SENT TO PCP 09-25-2014  . Bilateral lower extremity edema   . COPD mixed type [facility]) pulmologist-  dr Reginia Naas [facility])  note in epic under Care Everywhere tab)   per PFTs: 10/10/2013: Evidence of moderate  obstructive lung disease with small airway involvement (asthma); FEV1 54% predicted, FEV1/FVC <70%, FEF25-75% is reduced. Mild response to bronchodilator; FEV1 w/ 8% change after bronchodilator.    -- and  mild intermittant asthma  . Coronary atherosclerosis of native coronary artery    cardiologist-  dr schwengel  in [place], [place] (where pt lives during spring and summer)  . Eczema   . Elevated prostate specific antigen (PSA) 10/10/2006  . First degree heart block   . Frequency of urination   . GERD (gastroesophageal reflux disease)   . Herpes simplex of eye    bilateral eye-  takes acyclovir daily  . History of kidney stones   . History of MI (myocardial infarction)    2001--  s/p  PCI and stent  . Hyperlipidemia   . Hypertension   . Hypothyroidism   . Mild intermittent asthma   . Nephrolithiasis    right  . OA (osteoarthritis)   . Rheumatoid arthritis ([place]) 05/2013  . Right ureteral stone   . Urgency of urination   . Wears glasses   . Wears hearing aid    bilateral      PAST SURGICAL HISTORY:      Past Surgical History:  Procedure Laterality Date  . CATARACT EXTRACTION W/ INTRAOCULAR LENS  IMPLANT, BILATERAL Bilateral 2014  . COLONOSCOPY  2010  . CORONARY ANGIOPLASTY WITH STENT PLACEMENT  2001   ([address], [place])   PCI and stenting  . CYSTOSCOPY WITH RETROGRADE PYELOGRAM, URETEROSCOPY AND STENT PLACEMENT Right 09/30/2015   Procedure: RIGHT URETEROSCOPY, RETROGRADE PYELOGRAM, LASER LITHOTRIPSY AND STENT PLACEMENT;  Surgeon: Kathie Rhodes, MD;  Location: [facility];  Service: Urology;  Laterality: Right;  . CYSTOSCOPY/URETEROSCOPY/HOLMIUM LASER/STENT PLACEMENT Right 11/04/2015   Procedure: RIGHT URETEROSCOPY/RETROGRADE PYELOGRAM/HOLMIUM LASER LITHOTRIPSY/STENT PLACEMENT;  Surgeon: Kathie Rhodes, MD;  Location: [facility];  Service: Urology;  Laterality: Right;  . PILONIDAL CYST EXCISION  2006  . TONSILLECTOMY  as child  .  TOTAL HIP ARTHROPLASTY Left 2001    FAMILY HISTORY:       Family History  Problem Relation Age of Onset  . Stroke Mother     SOCIAL HISTORY:  Social History        Social History  . Marital status: Married    Spouse name: N/A  . Number of children: N/A  . Years of education: N/A   Occupational History  . retired Programme researcher, broadcasting/film/video          Social History Main Topics  . Smoking status: Former Smoker    Years: 15.00    Types: Cigarettes    Quit date: 09/21/1962  . Smokeless tobacco: Never Used  . Alcohol use 3.0 oz/week    5 Glasses of wine per week  . Drug use: No  . Sexual activity: Not on file       Other Topics Concern  . Not on file      Social History Narrative   Patient is Married since 1955. Retired Programme researcher, broadcasting/film/video. Lives in single level home, Independent Living section at [facility] since 2013.  Spends half the year at home in [place].    Stopped smoking 1964, Moderate alcohol intake, 5 glasses wine/ week    Minimal exercise, walking. Has pet cat.   Patient has no Advanced planning documents             ALLERGIES: Ketoconazole  MEDICATIONS:        Current Outpatient Prescriptions  Medication Sig Dispense Refill  . acyclovir (ZOVIRAX) 400 MG tablet Take 400 mg by mouth every morning. Take one tablet daily for virus in eyes    . albuterol (PROAIR HFA) 108 (90 BASE) MCG/ACT inhaler Inhale 2 puffs into the lungs every 6 (six) hours as needed for wheezing or shortness of breath. 1 Inhaler 3  . aspirin 81 MG tablet Take 81 mg by mouth daily.    Marland Kitchen atorvastatin (LIPITOR) 80 MG tablet Take 80 mg by mouth every morning. Take one daily for cholesterol    . budesonide (PULMICORT) 0.25 MG/2ML nebulizer solution Take 0.25 mg by nebulization as needed.     . calcium carbonate (TUMS - DOSED IN MG ELEMENTAL CALCIUM) 500 MG chewable tablet Chew 1 tablet by mouth as needed for indigestion or heartburn.    .  ciclopirox (LOPROX) 0.77 % cream Apply topically. Apply behind knee for fungus as needed    . clobetasol cream (TEMOVATE) [id number] % Apply 1 application topically. As needed for eczema    . Emollient (CERAVE) CREA Apply topically. Apply moisturizer as needed    . ferrous sulfate 325 (65 FE) MG tablet TAKE 1 TABLET BY MOUTH DAILY WITH BREAKFAST 30 tablet 3  . fexofenadine (ALLEGRA) 180 MG tablet Take 180 mg by mouth every morning. Take one tablet  daily for allergies    . fluocinonide cream (LIDEX) [id number] % Apply 1 application topically. Apply cream as needed for eczzema    . hydroxychloroquine (PLAQUENIL) 200 MG tablet Take 200 mg by mouth every morning.     Marland Kitchen ipratropium-albuterol (DUONEB) 0.5-2.5 (3) MG/3ML SOLN Take 3 mLs by nebulization every 6 (six) hours as needed. Use in nebulizer every 6 hours in emergency    . levothyroxine (SYNTHROID, LEVOTHROID) 50 MCG tablet TAKE 1 TABLET BY MOUTH DAILY 90 tablet 1  . metoprolol succinate (TOPROL-XL) 50 MG 24 hr tablet Take 50 mg by mouth every morning. Take one tablet daily for blood pressure    . Multiple Vitamins-Minerals (CENTRUM SILVER ADULT 50+) TABS Take 1 tablet by mouth every morning.    . nitroGLYCERIN (NITROSTAT) 0.4 MG SL tablet Place 0.4 mg under the tongue every 5 (five) minutes as needed for chest pain.    . phenazopyridine (PYRIDIUM) 200 MG tablet Take 1 tablet (200 mg total) by mouth 3 (three) times daily as needed for pain. 30 tablet 0  . tamsulosin (FLOMAX) 0.4 MG CAPS capsule Take 0.4 mg by mouth daily after breakfast. Reported on 11/14/2015    . tolnaftate (TINACTIN) 1 % spray Apply topically. Spray for fungus as needed    . triamcinolone cream (KENALOG) 0.1 % Apply 1 application topically. Apply for eczema behind ear and belly button as needed     No current facility-administered medications for this encounter.     REVIEW OF SYSTEMS:  On review of systems, the patient reports that he is doing well overall. He  denies any chest pain, shortness of breath, cough, fevers, chills, night sweats, unintended weight changes. He denies any bowel  disturbances, and denies abdominal pain, nausea or vomiting. He denies any new musculoskeletal or joint aches or pains. IPSS score is 6, and his confidence to complete sexual activity is low, and he reports episodic periods of nocturia, frequency, urgency, and leakage  A complete review of systems is obtained and is otherwise negative.   PHYSICAL EXAM:  Vitals:   06/29/16 1003  BP: (!) 126/59  Pulse: 67  Resp: 18    Pain Scale 0/10 In general this is a well appearing Caucasian male in no acute distress. He is alert and oriented x4 and appropriate throughout the examination. HEENT reveals that the patient is normocephalic, atraumatic. EOMs are intact. PERRLA. Skin is intact without any evidence of gross lesions. Cardiovascular exam reveals a regular rate and rhythm, no clicks rubs or murmurs are auscultated. Chest is clear to auscultation bilaterally. Lymphatic assessment is performed and does not reveal any adenopathy in the cervical, supraclavicular, axillary, or inguinal chains. Abdomen has active bowel sounds in all quadrants and is intact. The abdomen is soft, non tender, non distended. Lower extremities are negative for pretibial pitting edema, deep calf tenderness, cyanosis or clubbing.   KPS = 100  100 - Normal; no complaints; no evidence of disease. 90   - Able to carry on normal activity; minor signs or symptoms of disease. 80   - Normal activity with effort; some signs or symptoms of disease. 61   - Cares for self; unable to carry on normal activity or to do active work. 60   - Requires occasional assistance, but is able to care for most of his personal needs. 50   - Requires considerable assistance and frequent medical care. 37   - Disabled; requires special care and assistance. 30   - Severely disabled; hospital  admission is indicated although death  not imminent. 54   - Very sick; hospital admission necessary; active supportive treatment necessary. 10   - Moribund; fatal processes progressing rapidly. 0     - Dead  Karnofsky DA, Abelmann [place], Craver LS and Burchenal [place] [phone]) The use of the nitrogen mustards in the palliative treatment of carcinoma: with particular reference to bronchogenic carcinoma Cancer 1 634-56  LABORATORY DATA:  Recent Labs       Lab Results  Component Value Date   WBC 12.6 12/19/2015   HGB 10.9 (A) 12/19/2015   HCT 34 (A) 12/19/2015   PLT 371 12/19/2015     Recent Labs       Lab Results  Component Value Date   NA 146 12/19/2015   K 4.7 12/19/2015   CL 108 11/04/2015     Recent Labs       Lab Results  Component Value Date   ALT 24 11/21/2015   AST 21 11/21/2015   ALKPHOS 100 11/21/2015       RADIOGRAPHY:  Imaging Results  No results found.      IMPRESSION/PLAN: 1.         82 y.o. gentleman with high risk, T2c adenocarcinoma of the prostate, Gleason's of 4+5, and a PSA of 15.9. Dr. Tammi Klippel discusses the findings and work up thus far. He discusses the patient's findings from pathology, and imaging studies. He agrees that the recommendations would be to continue his Lupron/Bicludamide and 8 weeks of daily external radiotherapy to the prostate. He outlines 40 fractions of treatment, as well as the risks, benefits, short, and long term effects of radiotherapy, and the patient is interested in proceeding. We will refer the patient back to Dr. Gaynelle Arabian for placement of gold fiducial markers, and we would meet back to do simulation the middle of November.   In a visit lasting 60 minutes, greater than 50% of the time was spent face to face discussing treatment related side effects and coordination of his care.  The above documentation reflects my direct findings during this shared patient visit. Please see the separate note by Dr. Tammi Klippel on this date for the remainder of the  patient's plan of care.   Carola Rhine, PAC   This document serves as a record of services personally performed by Tyler Pita, MD. It was created on his behalf by Bethann Humble, a trained medical scribe. The creation of this record is based on the scribe's personal observations and the provider's statements to them. This document has been checked and approved by the attending provider.

## 2016-06-29 NOTE — Progress Notes (Signed)
See progress note under physician encounter. 

## 2016-07-01 ENCOUNTER — Telehealth: Payer: Self-pay | Admitting: *Deleted

## 2016-07-01 NOTE — Telephone Encounter (Signed)
CALLED PATIENT TO INFORM OF GOLD SEED PLACEMENT ON 07-20-16- ARRIVAL TIME - 7:45 AM AND HIS SIM ON 08-07-16 @ 11 AM @ DR. MANNING'S OFFICE, NO ANSWER MAILED APPT. CARD

## 2016-07-01 NOTE — Addendum Note (Signed)
Encounter addended by: Hayden Pedro, PA-C on: 07/01/2016  4:39 PM<BR>    Actions taken: Sign clinical note

## 2016-07-02 DIAGNOSIS — H4911 Fourth [trochlear] nerve palsy, right eye: Secondary | ICD-10-CM | POA: Diagnosis not present

## 2016-07-02 DIAGNOSIS — H532 Diplopia: Secondary | ICD-10-CM | POA: Diagnosis not present

## 2016-07-03 ENCOUNTER — Telehealth: Payer: Self-pay | Admitting: *Deleted

## 2016-07-03 NOTE — Telephone Encounter (Signed)
CALLED PATIENT TO INFORM OF SIM APPT. ON 07-29-16 @ 11 AM @ DR. MANNING'S OFFICE, SPOKE WITH PATIENT AND HE IS AWARE OF THIS APPT.

## 2016-07-08 ENCOUNTER — Encounter: Payer: Self-pay | Admitting: Radiation Oncology

## 2016-07-08 NOTE — Addendum Note (Signed)
Encounter addended by: Heywood Footman, RN on: 07/08/2016  2:22 PM<BR>    Actions taken: Charge Capture section accepted

## 2016-07-16 DIAGNOSIS — H02831 Dermatochalasis of right upper eyelid: Secondary | ICD-10-CM | POA: Diagnosis not present

## 2016-07-16 DIAGNOSIS — H02834 Dermatochalasis of left upper eyelid: Secondary | ICD-10-CM | POA: Diagnosis not present

## 2016-07-16 DIAGNOSIS — H0279 Other degenerative disorders of eyelid and periocular area: Secondary | ICD-10-CM | POA: Diagnosis not present

## 2016-07-20 ENCOUNTER — Other Ambulatory Visit: Payer: Self-pay | Admitting: Internal Medicine

## 2016-07-20 DIAGNOSIS — C61 Malignant neoplasm of prostate: Secondary | ICD-10-CM | POA: Diagnosis not present

## 2016-07-24 ENCOUNTER — Encounter: Payer: Self-pay | Admitting: *Deleted

## 2016-07-24 NOTE — Progress Notes (Signed)
[facility] Psychosocial Distress Screening Clinical Social Work  Clinical Social Work was referred by distress screening protocol.  The patient scored a 9 on the Psychosocial Distress Thermometer which indicates severe distress. Clinical Social Worker phoned pt to assess for distress and other psychosocial needs. CSW left brief, supportive HIPPA compliant message for pt and encouraged return call.   ONCBCN DISTRESS SCREENING 06/29/2016  Screening Type Initial Screening  Distress experienced in past week (1-10) 9  Emotional problem type Nervousness/Anxiety;Adjusting to illness  Information Concerns Type Lack of info about treatment  Physical Problem type Getting around;Breathing;Skin dry/itchy  Physician notified of physical symptoms Yes  Referral to clinical psychology No  Referral to clinical social work Yes  Referral to dietition No  Referral to financial advocate No  Referral to support programs No  Referral to palliative care No    Clinical Social Worker follow up needed: No.  If yes, follow up plan:  Loren Racer, [place]  [facility] Phone: [phone] Fax: [phone]

## 2016-07-27 NOTE — Progress Notes (Signed)
  Radiation Oncology         [phone] ________________________________  Name: Kevin Kane MRN: [id number]  Date: 07/29/2016  DOB: 27-Sep-1929  SIMULATION AND TREATMENT PLANNING NOTE    ICD-9-CM ICD-10-CM   1. Malignant neoplasm of prostate ([place]) 185 C61     DIAGNOSIS:  82 y.o. gentleman with stage T2c adenocarcinoma of the prostate with a Gleason's of 4+5 and a PSA of 15.9  NARRATIVE:  The patient was brought to the [facility].  Identity was confirmed.  All relevant records and images related to the planned course of therapy were reviewed.  The patient freely provided informed written consent to proceed with treatment after reviewing the details related to the planned course of therapy. The consent form was witnessed and verified by the simulation staff.  Then, the patient was set-up in a stable reproducible supine position for radiation therapy.  A vacuum lock pillow device was custom fabricated to position his legs in a reproducible immobilized position.  Then, I performed a urethrogram under sterile conditions to identify the prostatic apex.  CT images were obtained.  Surface markings were placed.  The CT images were loaded into the planning software.  Then the prostate target and avoidance structures including the rectum, bladder, bowel and hips were contoured.  Treatment planning then occurred.  The radiation prescription was entered and confirmed.  A total of one complex treatment devices were fabricated. I have requested : Intensity Modulated Radiotherapy (IMRT) is medically necessary for this case for the following reason:  Rectal sparing.Marland Kitchen  PLAN:  The patient will receive 75 Gy in 40 fractions with 45 Gy to lymph nodes then boost to prostate.  ________________________________  Sheral Apley. Tammi Klippel, M.D.

## 2016-07-28 ENCOUNTER — Encounter: Payer: Self-pay | Admitting: Medical Oncology

## 2016-07-28 DIAGNOSIS — H53483 Generalized contraction of visual field, bilateral: Secondary | ICD-10-CM | POA: Diagnosis not present

## 2016-07-29 ENCOUNTER — Ambulatory Visit
Admission: RE | Admit: 2016-07-29 | Discharge: 2016-07-29 | Disposition: A | Discharge status: Home / Self Care | Payer: Medicare Other | Source: Ambulatory Visit | Attending: Radiation Oncology | Admitting: Radiation Oncology

## 2016-07-29 DIAGNOSIS — Z510 Encounter for antineoplastic radiation therapy: Secondary | ICD-10-CM | POA: Diagnosis not present

## 2016-07-29 DIAGNOSIS — K219 Gastro-esophageal reflux disease without esophagitis: Secondary | ICD-10-CM | POA: Diagnosis not present

## 2016-07-29 DIAGNOSIS — C61 Malignant neoplasm of prostate: Secondary | ICD-10-CM

## 2016-07-29 DIAGNOSIS — E785 Hyperlipidemia, unspecified: Secondary | ICD-10-CM | POA: Diagnosis not present

## 2016-07-29 DIAGNOSIS — I252 Old myocardial infarction: Secondary | ICD-10-CM | POA: Diagnosis not present

## 2016-07-29 DIAGNOSIS — I10 Essential (primary) hypertension: Secondary | ICD-10-CM | POA: Diagnosis not present

## 2016-08-07 ENCOUNTER — Ambulatory Visit: Payer: Medicare Other | Admitting: Radiation Oncology

## 2016-08-07 DIAGNOSIS — I10 Essential (primary) hypertension: Secondary | ICD-10-CM | POA: Diagnosis not present

## 2016-08-07 DIAGNOSIS — E785 Hyperlipidemia, unspecified: Secondary | ICD-10-CM | POA: Diagnosis not present

## 2016-08-07 DIAGNOSIS — C61 Malignant neoplasm of prostate: Secondary | ICD-10-CM | POA: Diagnosis not present

## 2016-08-07 DIAGNOSIS — I252 Old myocardial infarction: Secondary | ICD-10-CM | POA: Diagnosis not present

## 2016-08-07 DIAGNOSIS — K219 Gastro-esophageal reflux disease without esophagitis: Secondary | ICD-10-CM | POA: Diagnosis not present

## 2016-08-07 DIAGNOSIS — Z510 Encounter for antineoplastic radiation therapy: Secondary | ICD-10-CM | POA: Diagnosis not present

## 2016-08-10 ENCOUNTER — Ambulatory Visit
Admission: RE | Admit: 2016-08-10 | Discharge: 2016-08-10 | Disposition: A | Discharge status: Home / Self Care | Payer: Medicare Other | Source: Ambulatory Visit | Attending: Radiation Oncology | Admitting: Radiation Oncology

## 2016-08-10 DIAGNOSIS — C61 Malignant neoplasm of prostate: Secondary | ICD-10-CM | POA: Diagnosis not present

## 2016-08-10 DIAGNOSIS — I10 Essential (primary) hypertension: Secondary | ICD-10-CM | POA: Diagnosis not present

## 2016-08-10 DIAGNOSIS — E785 Hyperlipidemia, unspecified: Secondary | ICD-10-CM | POA: Diagnosis not present

## 2016-08-10 DIAGNOSIS — Z510 Encounter for antineoplastic radiation therapy: Secondary | ICD-10-CM | POA: Diagnosis not present

## 2016-08-10 DIAGNOSIS — I252 Old myocardial infarction: Secondary | ICD-10-CM | POA: Diagnosis not present

## 2016-08-10 DIAGNOSIS — K219 Gastro-esophageal reflux disease without esophagitis: Secondary | ICD-10-CM | POA: Diagnosis not present

## 2016-08-11 ENCOUNTER — Ambulatory Visit
Admission: RE | Admit: 2016-08-11 | Discharge: 2016-08-11 | Disposition: A | Discharge status: Home / Self Care | Payer: Medicare Other | Source: Ambulatory Visit | Attending: Radiation Oncology | Admitting: Radiation Oncology

## 2016-08-11 DIAGNOSIS — I252 Old myocardial infarction: Secondary | ICD-10-CM | POA: Diagnosis not present

## 2016-08-11 DIAGNOSIS — K219 Gastro-esophageal reflux disease without esophagitis: Secondary | ICD-10-CM | POA: Diagnosis not present

## 2016-08-11 DIAGNOSIS — I10 Essential (primary) hypertension: Secondary | ICD-10-CM | POA: Diagnosis not present

## 2016-08-11 DIAGNOSIS — Z510 Encounter for antineoplastic radiation therapy: Secondary | ICD-10-CM | POA: Diagnosis not present

## 2016-08-11 DIAGNOSIS — C61 Malignant neoplasm of prostate: Secondary | ICD-10-CM | POA: Diagnosis not present

## 2016-08-11 DIAGNOSIS — E785 Hyperlipidemia, unspecified: Secondary | ICD-10-CM | POA: Diagnosis not present

## 2016-08-12 ENCOUNTER — Ambulatory Visit
Admission: RE | Admit: 2016-08-12 | Discharge: 2016-08-12 | Disposition: A | Discharge status: Home / Self Care | Payer: Medicare Other | Source: Ambulatory Visit | Attending: Radiation Oncology | Admitting: Radiation Oncology

## 2016-08-12 VITALS — BP 148/51 | HR 79 | Resp 16 | Wt 255.6 lb

## 2016-08-12 DIAGNOSIS — C61 Malignant neoplasm of prostate: Secondary | ICD-10-CM | POA: Diagnosis not present

## 2016-08-12 DIAGNOSIS — E785 Hyperlipidemia, unspecified: Secondary | ICD-10-CM | POA: Diagnosis not present

## 2016-08-12 DIAGNOSIS — I10 Essential (primary) hypertension: Secondary | ICD-10-CM | POA: Diagnosis not present

## 2016-08-12 DIAGNOSIS — Z510 Encounter for antineoplastic radiation therapy: Secondary | ICD-10-CM | POA: Diagnosis not present

## 2016-08-12 DIAGNOSIS — I252 Old myocardial infarction: Secondary | ICD-10-CM | POA: Diagnosis not present

## 2016-08-12 DIAGNOSIS — K219 Gastro-esophageal reflux disease without esophagitis: Secondary | ICD-10-CM | POA: Diagnosis not present

## 2016-08-12 NOTE — Progress Notes (Signed)
Weight and vitals stable. Denies pain. Denies nocturia, hematuria or dysuria. Describes a steady urine stream without difficulty emptying his bladder. Reports occasional leakage. Reports occasional diarrhea.   BP (!) 148/51 (BP Location: Right Arm, Patient Position: Sitting, Cuff Size: Normal)   Pulse 79   Resp 16   Wt 255 lb 9.6 oz (115.9 kg)   SpO2 100%   BMI 40.03 kg/m  Wt Readings from Last 3 Encounters:  08/12/16 255 lb 9.6 oz (115.9 kg)  06/29/16 257 lb 9.6 oz (116.8 kg)  06/18/16 257 lb 12.8 oz (116.9 kg)

## 2016-08-12 NOTE — Progress Notes (Signed)
  Radiation Oncology         [phone]   Name: Kevin Kane MRN: [id number]   Date: 08/12/2016  DOB: Nov 13, 1929     Weekly Radiation Therapy Management    ICD-9-CM ICD-10-CM   1. Malignant neoplasm of prostate ([facility]) 185 C61     Current Dose: 5.4 Gy  Planned Dose:  45 Gy  Narrative The patient presents for routine under treatment assessment.  Weight and vitals stable. Denies pain. Denies nocturia, hematuria or dysuria. Describes a steady urine stream without difficulty emptying his bladder. Reports occasional leakage. Reports occasional diarrhea.  The patient is without complaint. Set-up films were reviewed. The chart was checked.  Physical Findings  weight is 255 lb 9.6 oz (115.9 kg). His blood pressure is 148/51 (abnormal) and his pulse is 79. His respiration is 16 and oxygen saturation is 100%. . Weight essentially stable.  No significant changes. Uses walker to ambulate.  Impression The patient is tolerating radiation.  Plan Continue treatment as planned.         Sheral Apley Tammi Klippel, M.D.  This document serves as a record of services personally performed by Tyler Pita, MD. It was created on his behalf by Arlyce Harman, a trained medical scribe. The creation of this record is based on the scribe's personal observations and the provider's statements to them. This document has been checked and approved by the attending provider.

## 2016-08-14 ENCOUNTER — Ambulatory Visit: Payer: Medicare Other

## 2016-08-17 ENCOUNTER — Ambulatory Visit
Admission: RE | Admit: 2016-08-17 | Discharge: 2016-08-17 | Disposition: A | Discharge status: Home / Self Care | Payer: Medicare Other | Source: Ambulatory Visit | Attending: Radiation Oncology | Admitting: Radiation Oncology

## 2016-08-17 DIAGNOSIS — I10 Essential (primary) hypertension: Secondary | ICD-10-CM | POA: Diagnosis not present

## 2016-08-17 DIAGNOSIS — C61 Malignant neoplasm of prostate: Secondary | ICD-10-CM | POA: Diagnosis not present

## 2016-08-17 DIAGNOSIS — E785 Hyperlipidemia, unspecified: Secondary | ICD-10-CM | POA: Diagnosis not present

## 2016-08-17 DIAGNOSIS — Z510 Encounter for antineoplastic radiation therapy: Secondary | ICD-10-CM | POA: Diagnosis not present

## 2016-08-17 DIAGNOSIS — K219 Gastro-esophageal reflux disease without esophagitis: Secondary | ICD-10-CM | POA: Diagnosis not present

## 2016-08-17 DIAGNOSIS — I252 Old myocardial infarction: Secondary | ICD-10-CM | POA: Diagnosis not present

## 2016-08-18 ENCOUNTER — Ambulatory Visit
Admission: RE | Admit: 2016-08-18 | Discharge: 2016-08-18 | Disposition: A | Discharge status: Home / Self Care | Payer: Medicare Other | Source: Ambulatory Visit | Attending: Radiation Oncology | Admitting: Radiation Oncology

## 2016-08-18 DIAGNOSIS — I252 Old myocardial infarction: Secondary | ICD-10-CM | POA: Diagnosis not present

## 2016-08-18 DIAGNOSIS — E785 Hyperlipidemia, unspecified: Secondary | ICD-10-CM | POA: Diagnosis not present

## 2016-08-18 DIAGNOSIS — Z510 Encounter for antineoplastic radiation therapy: Secondary | ICD-10-CM | POA: Diagnosis not present

## 2016-08-18 DIAGNOSIS — C61 Malignant neoplasm of prostate: Secondary | ICD-10-CM | POA: Diagnosis not present

## 2016-08-18 DIAGNOSIS — K219 Gastro-esophageal reflux disease without esophagitis: Secondary | ICD-10-CM | POA: Diagnosis not present

## 2016-08-18 DIAGNOSIS — I10 Essential (primary) hypertension: Secondary | ICD-10-CM | POA: Diagnosis not present

## 2016-08-19 ENCOUNTER — Ambulatory Visit
Admission: RE | Admit: 2016-08-19 | Discharge: 2016-08-19 | Disposition: A | Discharge status: Home / Self Care | Payer: Medicare Other | Source: Ambulatory Visit | Attending: Radiation Oncology | Admitting: Radiation Oncology

## 2016-08-19 DIAGNOSIS — K219 Gastro-esophageal reflux disease without esophagitis: Secondary | ICD-10-CM | POA: Diagnosis not present

## 2016-08-19 DIAGNOSIS — Z510 Encounter for antineoplastic radiation therapy: Secondary | ICD-10-CM | POA: Diagnosis not present

## 2016-08-19 DIAGNOSIS — I252 Old myocardial infarction: Secondary | ICD-10-CM | POA: Diagnosis not present

## 2016-08-19 DIAGNOSIS — I10 Essential (primary) hypertension: Secondary | ICD-10-CM | POA: Diagnosis not present

## 2016-08-19 DIAGNOSIS — C61 Malignant neoplasm of prostate: Secondary | ICD-10-CM | POA: Diagnosis not present

## 2016-08-19 DIAGNOSIS — E785 Hyperlipidemia, unspecified: Secondary | ICD-10-CM | POA: Diagnosis not present

## 2016-08-20 ENCOUNTER — Ambulatory Visit
Admission: RE | Admit: 2016-08-20 | Discharge: 2016-08-20 | Disposition: A | Discharge status: Home / Self Care | Payer: Medicare Other | Source: Ambulatory Visit | Attending: Radiation Oncology | Admitting: Radiation Oncology

## 2016-08-20 DIAGNOSIS — I10 Essential (primary) hypertension: Secondary | ICD-10-CM | POA: Diagnosis not present

## 2016-08-20 DIAGNOSIS — K219 Gastro-esophageal reflux disease without esophagitis: Secondary | ICD-10-CM | POA: Diagnosis not present

## 2016-08-20 DIAGNOSIS — Z510 Encounter for antineoplastic radiation therapy: Secondary | ICD-10-CM | POA: Diagnosis not present

## 2016-08-20 DIAGNOSIS — C61 Malignant neoplasm of prostate: Secondary | ICD-10-CM | POA: Diagnosis not present

## 2016-08-20 DIAGNOSIS — I252 Old myocardial infarction: Secondary | ICD-10-CM | POA: Diagnosis not present

## 2016-08-20 DIAGNOSIS — E785 Hyperlipidemia, unspecified: Secondary | ICD-10-CM | POA: Diagnosis not present

## 2016-08-21 ENCOUNTER — Ambulatory Visit
Admission: RE | Admit: 2016-08-21 | Discharge: 2016-08-21 | Disposition: A | Discharge status: Home / Self Care | Payer: Medicare Other | Source: Ambulatory Visit | Attending: Radiation Oncology | Admitting: Radiation Oncology

## 2016-08-21 VITALS — BP 139/53 | HR 65 | Resp 18 | Wt 257.8 lb

## 2016-08-21 DIAGNOSIS — I10 Essential (primary) hypertension: Secondary | ICD-10-CM | POA: Diagnosis not present

## 2016-08-21 DIAGNOSIS — Z510 Encounter for antineoplastic radiation therapy: Secondary | ICD-10-CM | POA: Diagnosis not present

## 2016-08-21 DIAGNOSIS — C61 Malignant neoplasm of prostate: Secondary | ICD-10-CM

## 2016-08-21 DIAGNOSIS — E785 Hyperlipidemia, unspecified: Secondary | ICD-10-CM | POA: Diagnosis not present

## 2016-08-21 DIAGNOSIS — I252 Old myocardial infarction: Secondary | ICD-10-CM | POA: Diagnosis not present

## 2016-08-21 DIAGNOSIS — K219 Gastro-esophageal reflux disease without esophagitis: Secondary | ICD-10-CM | POA: Diagnosis not present

## 2016-08-21 NOTE — Progress Notes (Signed)
Weight and vitals stable. Denies pain. Denies nocturia, hematuria, or dysuria. Describes a strong steady urine stream without difficulty emptying his bladder. Reports occasional leakage. Reports occasional diarrhea. Reports mild fatigue.   BP (!) 139/53 (BP Location: Right Arm, Patient Position: Sitting, Cuff Size: Normal)   Pulse 65   Resp 18   Wt 257 lb 12.8 oz (116.9 kg)   SpO2 100%   BMI 40.38 kg/m  Wt Readings from Last 3 Encounters:  08/21/16 257 lb 12.8 oz (116.9 kg)  08/12/16 255 lb 9.6 oz (115.9 kg)  06/29/16 257 lb 9.6 oz (116.8 kg)

## 2016-08-21 NOTE — Progress Notes (Signed)
  Radiation Oncology         [phone]   Name: Kevin Kane MRN: [id number]   Date: 08/21/2016  DOB: 04-10-1930     Weekly Radiation Therapy Management    ICD-9-CM ICD-10-CM   1. Malignant neoplasm of prostate (HCC) 185 C61     Current Dose: 14.4 Gy  Planned Dose:  75 Gy  Narrative The patient presents for routine under treatment assessment.  Denies pain, nocturia, hematuria, or dysuria. Describes a strong steady urine stream without difficulty emptying his bladder. Reports occasional leakage (when he sneezes) and occasional diarrhea. Reports mild fatigue. Denies heaviness of the testicles.  Set-up films were reviewed. The chart was checked.  Physical Findings  weight is 257 lb 12.8 oz (116.9 kg). His blood pressure is 139/53 (abnormal) and his pulse is 65. His respiration is 18 and oxygen saturation is 100%. . Weight essentially stable.  No significant changes. Uses walker to ambulate.  Impression The patient is tolerating radiation. The patient could have stress incontinence.  Plan Continue treatment as planned. I advised the patient to practice Kegel exercises.         Sheral Apley Tammi Klippel, M.D.  This document serves as a record of services personally performed by Tyler Pita, MD. It was created on his behalf by Darcus Austin, a trained medical scribe. The creation of this record is based on the scribe's personal observations and the provider's statements to them. This document has been checked and approved by the attending provider.

## 2016-08-24 ENCOUNTER — Ambulatory Visit
Admission: RE | Admit: 2016-08-24 | Discharge: 2016-08-24 | Disposition: A | Discharge status: Home / Self Care | Payer: Medicare Other | Source: Ambulatory Visit | Attending: Radiation Oncology | Admitting: Radiation Oncology

## 2016-08-24 DIAGNOSIS — C61 Malignant neoplasm of prostate: Secondary | ICD-10-CM | POA: Diagnosis not present

## 2016-08-24 DIAGNOSIS — I252 Old myocardial infarction: Secondary | ICD-10-CM | POA: Diagnosis not present

## 2016-08-24 DIAGNOSIS — E785 Hyperlipidemia, unspecified: Secondary | ICD-10-CM | POA: Diagnosis not present

## 2016-08-24 DIAGNOSIS — I10 Essential (primary) hypertension: Secondary | ICD-10-CM | POA: Diagnosis not present

## 2016-08-24 DIAGNOSIS — Z510 Encounter for antineoplastic radiation therapy: Secondary | ICD-10-CM | POA: Diagnosis not present

## 2016-08-24 DIAGNOSIS — K219 Gastro-esophageal reflux disease without esophagitis: Secondary | ICD-10-CM | POA: Diagnosis not present

## 2016-08-25 ENCOUNTER — Ambulatory Visit
Admission: RE | Admit: 2016-08-25 | Discharge: 2016-08-25 | Disposition: A | Discharge status: Home / Self Care | Payer: Medicare Other | Source: Ambulatory Visit | Attending: Radiation Oncology | Admitting: Radiation Oncology

## 2016-08-25 DIAGNOSIS — Z510 Encounter for antineoplastic radiation therapy: Secondary | ICD-10-CM | POA: Diagnosis not present

## 2016-08-25 DIAGNOSIS — K219 Gastro-esophageal reflux disease without esophagitis: Secondary | ICD-10-CM | POA: Diagnosis not present

## 2016-08-25 DIAGNOSIS — C61 Malignant neoplasm of prostate: Secondary | ICD-10-CM | POA: Diagnosis not present

## 2016-08-25 DIAGNOSIS — E785 Hyperlipidemia, unspecified: Secondary | ICD-10-CM | POA: Diagnosis not present

## 2016-08-25 DIAGNOSIS — I10 Essential (primary) hypertension: Secondary | ICD-10-CM | POA: Diagnosis not present

## 2016-08-25 DIAGNOSIS — I252 Old myocardial infarction: Secondary | ICD-10-CM | POA: Diagnosis not present

## 2016-08-26 ENCOUNTER — Ambulatory Visit
Admission: RE | Admit: 2016-08-26 | Discharge: 2016-08-26 | Disposition: A | Discharge status: Home / Self Care | Payer: Medicare Other | Source: Ambulatory Visit | Attending: Radiation Oncology | Admitting: Radiation Oncology

## 2016-08-26 DIAGNOSIS — E785 Hyperlipidemia, unspecified: Secondary | ICD-10-CM | POA: Diagnosis not present

## 2016-08-26 DIAGNOSIS — I10 Essential (primary) hypertension: Secondary | ICD-10-CM | POA: Diagnosis not present

## 2016-08-26 DIAGNOSIS — C61 Malignant neoplasm of prostate: Secondary | ICD-10-CM | POA: Diagnosis not present

## 2016-08-26 DIAGNOSIS — Z510 Encounter for antineoplastic radiation therapy: Secondary | ICD-10-CM | POA: Diagnosis not present

## 2016-08-26 DIAGNOSIS — K219 Gastro-esophageal reflux disease without esophagitis: Secondary | ICD-10-CM | POA: Diagnosis not present

## 2016-08-26 DIAGNOSIS — I252 Old myocardial infarction: Secondary | ICD-10-CM | POA: Diagnosis not present

## 2016-08-27 ENCOUNTER — Ambulatory Visit
Admission: RE | Admit: 2016-08-27 | Discharge: 2016-08-27 | Disposition: A | Discharge status: Home / Self Care | Payer: Medicare Other | Source: Ambulatory Visit | Attending: Radiation Oncology | Admitting: Radiation Oncology

## 2016-08-27 ENCOUNTER — Encounter: Payer: Self-pay | Admitting: Internal Medicine

## 2016-08-27 ENCOUNTER — Encounter: Payer: Self-pay | Admitting: Radiation Oncology

## 2016-08-27 ENCOUNTER — Ambulatory Visit (INDEPENDENT_AMBULATORY_CARE_PROVIDER_SITE_OTHER): Payer: Medicare Other | Admitting: Internal Medicine

## 2016-08-27 VITALS — BP 122/60 | HR 73 | Temp 98.7°F | Wt 257.0 lb

## 2016-08-27 DIAGNOSIS — I10 Essential (primary) hypertension: Secondary | ICD-10-CM | POA: Diagnosis not present

## 2016-08-27 DIAGNOSIS — J069 Acute upper respiratory infection, unspecified: Secondary | ICD-10-CM

## 2016-08-27 DIAGNOSIS — D508 Other iron deficiency anemias: Secondary | ICD-10-CM

## 2016-08-27 DIAGNOSIS — Z510 Encounter for antineoplastic radiation therapy: Secondary | ICD-10-CM | POA: Diagnosis not present

## 2016-08-27 DIAGNOSIS — I252 Old myocardial infarction: Secondary | ICD-10-CM | POA: Diagnosis not present

## 2016-08-27 DIAGNOSIS — C61 Malignant neoplasm of prostate: Secondary | ICD-10-CM | POA: Diagnosis not present

## 2016-08-27 DIAGNOSIS — K219 Gastro-esophageal reflux disease without esophagitis: Secondary | ICD-10-CM | POA: Diagnosis not present

## 2016-08-27 DIAGNOSIS — E785 Hyperlipidemia, unspecified: Secondary | ICD-10-CM | POA: Diagnosis not present

## 2016-08-27 LAB — CBC WITH DIFFERENTIAL/PLATELET
Basophils Absolute: 0 {cells}/uL (ref 0–200)
Basophils Relative: 0 %
Eosinophils Absolute: 77 {cells}/uL (ref 15–500)
Eosinophils Relative: 1 %
HCT: 30.2 % — ABNORMAL LOW (ref 38.5–50.0)
Hemoglobin: 9.9 g/dL — ABNORMAL LOW (ref 13.2–17.1)
Lymphocytes Relative: 6 %
Lymphs Abs: 462 {cells}/uL — ABNORMAL LOW (ref 850–3900)
MCH: 29.6 pg (ref 27.0–33.0)
MCHC: 32.8 g/dL (ref 32.0–36.0)
MCV: 90.4 fL (ref 80.0–100.0)
MPV: 9.5 fL (ref 7.5–12.5)
Monocytes Absolute: 539 {cells}/uL (ref 200–950)
Monocytes Relative: 7 %
Neutro Abs: 6622 {cells}/uL (ref 1500–7800)
Neutrophils Relative %: 86 %
Platelets: 213 10*3/uL (ref 140–400)
RBC: 3.34 MIL/uL — ABNORMAL LOW (ref 4.20–5.80)
RDW: 15 % (ref 11.0–15.0)
WBC: 7.7 10*3/uL (ref 3.8–10.8)

## 2016-08-27 MED ORDER — AZITHROMYCIN 250 MG PO TABS: ORAL_TABLET | ORAL | 0 refills | Status: DC

## 2016-08-27 NOTE — Progress Notes (Signed)
Location:  [facility] clinic Provider: Gatlin Kittell L. Mariea Clonts, D.O., C.M.D.  Code Status: DNR Goals of Care:  Advanced Directives 06/18/2016  Does Patient Have a Medical Advance Directive? No  Type of Advance Directive -  Does patient want to make changes to medical advance directive? -  Copy of [place] in Chart? -  Would patient like information on creating a medical advance directive? Yes - Environmental education officer Complaint  Patient presents with  . Acute Visit    cough, URI    HPI: Patient is a 82 y.o. male seen today for an acute visit for cough and upper respiratory infection.  His wife shared her illness with him.  He's been sick only a few days with productive cough, and now developing congestion in his head.  He denies fever, chills, myalgias, headache, sore throat, n/v/d.    He is now getting XRT for prostate cancer which was anticipated after his last PSA levels.  He had refused to f/u down here when I'd recommended it.    He reports he's gotten no benefit for his "RA" from hydroxychloroquine and has quit taking it.  We removed it from his list.    Past Medical History:  Diagnosis Date  . At risk for sleep apnea    STOP-BANG=5      SENT TO PCP 09-25-2014  . Bilateral lower extremity edema   . COPD mixed type [facility]) pulmologist-  dr Reginia Naas [facility])  note in epic under Care Everywhere tab)   per PFTs: 10/10/2013: Evidence of moderate obstructive lung disease with small airway involvement (asthma); FEV1 54% predicted, FEV1/FVC <70%, FEF25-75% is reduced. Mild response to bronchodilator; FEV1 w/ 8% change after bronchodilator.    -- and  mild intermittant asthma  . Coronary atherosclerosis of native coronary artery    cardiologist-  dr schwengel  in [place], [place] (where pt lives during spring and summer)  . Eczema   . Elevated prostate specific antigen (PSA) 10/10/2006  . First degree heart block   . Frequency of urination   . GERD  (gastroesophageal reflux disease)   . Herpes simplex of eye    bilateral eye-  takes acyclovir daily  . History of kidney stones   . History of MI (myocardial infarction)    2001--  s/p  PCI and stent  . Hyperlipidemia   . Hypertension   . Hypothyroidism   . Mild intermittent asthma   . Nephrolithiasis    right  . OA (osteoarthritis)   . Prostate cancer ([place])   . Rheumatoid arthritis ([place]) 05/2013  . Right ureteral stone   . Urgency of urination   . Wears glasses   . Wears hearing aid    bilateral    Past Surgical History:  Procedure Laterality Date  . CATARACT EXTRACTION W/ INTRAOCULAR LENS  IMPLANT, BILATERAL Bilateral 2014  . COLONOSCOPY  2010  . CORONARY ANGIOPLASTY WITH STENT PLACEMENT  2001   ([address] [place])   PCI and stenting  . CYSTOSCOPY WITH RETROGRADE PYELOGRAM, URETEROSCOPY AND STENT PLACEMENT Right 09/30/2015   Procedure: RIGHT URETEROSCOPY, RETROGRADE PYELOGRAM, LASER LITHOTRIPSY AND STENT PLACEMENT;  Surgeon: Kathie Rhodes, MD;  Location: [place];  Service: Urology;  Laterality: Right;  . CYSTOSCOPY/URETEROSCOPY/HOLMIUM LASER/STENT PLACEMENT Right 11/04/2015   Procedure: RIGHT URETEROSCOPY/RETROGRADE PYELOGRAM/HOLMIUM LASER LITHOTRIPSY/STENT PLACEMENT;  Surgeon: Kathie Rhodes, MD;  Location: [facility];  Service: Urology;  Laterality: Right;  . PILONIDAL CYST EXCISION  2006  . TONSILLECTOMY  as child  . TOTAL HIP ARTHROPLASTY Left 2001    Allergies  Allergen Reactions  . Ketoconazole Other (See Comments)    Adverse skin reaction-- legs turned red and purple      Medication List       Accurate as of 08/27/16  4:23 PM. Always use your most recent med list.          acyclovir 400 MG tablet Commonly known as:  ZOVIRAX Take 400 mg by mouth every morning. Take one tablet daily for virus in eyes   aspirin 81 MG tablet Take 81 mg by mouth daily.   atorvastatin 80 MG tablet Commonly known as:  LIPITOR Take 80 mg by mouth  every morning. Take one daily for cholesterol   bicalutamide 50 MG tablet Commonly known as:  CASODEX Take 50 mg by mouth daily.   calcium carbonate 500 MG chewable tablet Commonly known as:  TUMS - dosed in mg elemental calcium Chew 1 tablet by mouth as needed for indigestion or heartburn.   calcium citrate 950 MG tablet Commonly known as:  CALCITRATE - dosed in mg elemental calcium Take 200 mg of elemental calcium by mouth daily.   CENTRUM SILVER ADULT 50+ Tabs Take 1 tablet by mouth every morning.   ferrous sulfate 325 (65 FE) MG tablet TAKE 1 TABLET BY MOUTH DAILY WITH BREAKFAST   hydroxychloroquine 200 MG tablet Commonly known as:  PLAQUENIL Take 200 mg by mouth every morning.   levothyroxine 25 MCG tablet Commonly known as:  SYNTHROID, LEVOTHROID Take 25 mcg by mouth daily before breakfast.   metoprolol succinate 50 MG 24 hr tablet Commonly known as:  TOPROL-XL Take 50 mg by mouth every morning. Take one tablet daily for blood pressure   tamsulosin 0.4 MG Caps capsule Commonly known as:  FLOMAX Take 0.4 mg by mouth daily after breakfast. Reported on 11/14/2015       Review of Systems:  Review of Systems  Constitutional: Positive for malaise/fatigue. Negative for chills and fever.  HENT: Positive for congestion. Negative for ear pain, sinus pain and sore throat.   Eyes: Negative for blurred vision.  Respiratory: Positive for cough and sputum production. Negative for shortness of breath.   Cardiovascular: Negative for chest pain and palpitations.  Gastrointestinal: Negative for nausea and vomiting.  Genitourinary:       Prostate ca on XRT  Musculoskeletal: Negative for falls.       Pains actually better latelly  Neurological: Positive for weakness. Negative for dizziness.  Psychiatric/Behavioral: Negative for depression and memory loss.    Health Maintenance  Topic Date Due  . TETANUS/TDAP  08/07/1951  . ZOSTAVAX  08/06/1992  . PNA vac Low Risk Adult (1  of 2 - PCV13) 08/06/1997  . INFLUENZA VACCINE  04/21/2016    Physical Exam: Vitals:   08/27/16 1536  BP: 122/60  Pulse: 73  Temp: 98.7 F (37.1 C)  TempSrc: Oral  SpO2: 93%  Weight: 257 lb (116.6 kg)   Body mass index is 40.25 kg/m. Physical Exam  Constitutional: He is oriented to person, place, and time. He appears well-developed and well-nourished. No distress.  HENT:  Nasal congestion  Cardiovascular: Normal rate, regular rhythm, normal heart sounds and intact distal pulses.   Pulmonary/Chest: Effort normal and breath sounds normal.  Rhonchi anteriorly  Musculoskeletal: Normal range of motion.  Walks with walker  Neurological: He is alert and oriented to person, place, and time.  Skin: Skin is  warm and dry. There is pallor.  Psychiatric: He has a normal mood and affect.    Labs reviewed: Basic Metabolic Panel:  Recent Labs  09/30/15 1028 11/04/15 1104 11/19/15 11/21/15 12/19/15  NA 147* 145 144 144 146  K 4.8 4.5 4.9 5.0 4.7  CL  --  108  --   --   --   GLUCOSE 100* 94  --   --   --   BUN  --  29* 25* 27* 35*  CREATININE  --  1.50* 1.6* 1.7* 1.6*  TSH  --   --   --  3.98  --    Liver Function Tests:  Recent Labs  11/21/15  AST 21  ALT 24  ALKPHOS 100   No results for input(s): LIPASE, AMYLASE in the last 8760 hours. No results for input(s): AMMONIA in the last 8760 hours. CBC:  Recent Labs  11/19/15 11/21/15 12/19/15  WBC 14.4 11.9 12.6  HGB 9.8* 10.0* 10.9*  HCT 31* 31* 34*  PLT 452* 444* 371    Assessment/Plan 1. Acute upper respiratory infection - likely viral, but given his XRT and current cancer diagnosis, suspect he's at higher risk for subsequent bacterial infection as well -given same instructions as his wife for conservative mgt, plus zpak - azithromycin (ZITHROMAX) 250 MG tablet; 2 tabs today, then 1 tab daily for 4 days  Dispense: 6 tablet; Refill: 0  2. Prostate cancer ([place]) - now on XRT, f/u labs: - CBC with  Differential/Platelet - COMPLETE METABOLIC PANEL WITH GFR  3. Other iron deficiency anemia - f/u labs: - CBC with Differential/Platelet  Labs/tests ordered:   Orders Placed This Encounter  Procedures  . CBC with Differential/Platelet  . COMPLETE METABOLIC PANEL WITH GFR    [facility]   Next appt:  No appt made when he left  Shahd Occhipinti L. Viha Kriegel, D.O. [facility] [address], [place] [address] Cell Phone (Mon-Fri 8am-5pm):  [phone] On Call:  [phone] & follow prompts after 5pm & weekends Office Phone:  [phone] Office Fax:  [phone]

## 2016-08-27 NOTE — Patient Instructions (Signed)
Please take the zithromax as directed. Use vicks in a vaporizer to help with congestion Only plain robitussin

## 2016-08-28 ENCOUNTER — Ambulatory Visit: Payer: Medicare Other | Admitting: Radiation Oncology

## 2016-08-28 ENCOUNTER — Telehealth: Payer: Self-pay | Admitting: Radiation Oncology

## 2016-08-28 ENCOUNTER — Ambulatory Visit: Payer: Medicare Other

## 2016-08-28 LAB — COMPLETE METABOLIC PANEL WITH GFR
ALT: 21 U/L (ref 9–46)
AST: 20 U/L (ref 10–35)
Albumin: 3.7 g/dL (ref 3.6–5.1)
Alkaline Phosphatase: 86 U/L (ref 40–115)
BUN: 28 mg/dL — ABNORMAL HIGH (ref 7–25)
CO2: 27 mmol/L (ref 20–31)
Calcium: 9.1 mg/dL (ref 8.6–10.3)
Chloride: 108 mmol/L (ref 98–110)
Creat: 1.65 mg/dL — ABNORMAL HIGH (ref 0.70–1.11)
GFR, Est African American: 43 mL/min — ABNORMAL LOW (ref >=60)
GFR, Est Non African American: 38 mL/min — ABNORMAL LOW (ref >=60)
Glucose, Bld: 106 mg/dL — ABNORMAL HIGH (ref 65–99)
Potassium: 4.4 mmol/L (ref 3.5–5.3)
Sodium: 143 mmol/L (ref 135–146)
Total Bilirubin: 0.6 mg/dL (ref 0.2–1.2)
Total Protein: 5.8 g/dL — ABNORMAL LOW (ref 6.1–8.1)

## 2016-08-28 NOTE — Telephone Encounter (Signed)
Received call from patient cancelling his radiation treatment today. Patient reports cough and SOB. Reports he has been seen by his PCP and given antibiotics. Informed L3 of these findings and moved PUT with Dr. Tammi Klippel on Wednesday.

## 2016-08-31 ENCOUNTER — Ambulatory Visit
Admission: RE | Admit: 2016-08-31 | Discharge: 2016-08-31 | Disposition: A | Discharge status: Home / Self Care | Payer: Medicare Other | Source: Ambulatory Visit | Attending: Radiation Oncology | Admitting: Radiation Oncology

## 2016-08-31 ENCOUNTER — Encounter: Payer: Self-pay | Admitting: Internal Medicine

## 2016-08-31 ENCOUNTER — Encounter: Payer: Self-pay | Admitting: Radiation Oncology

## 2016-08-31 ENCOUNTER — Encounter: Payer: Self-pay | Admitting: Medical Oncology

## 2016-08-31 ENCOUNTER — Other Ambulatory Visit: Payer: Self-pay | Admitting: Internal Medicine

## 2016-08-31 VITALS — BP 145/60 | HR 78 | Temp 97.9°F | Resp 18 | Wt 251.6 lb

## 2016-08-31 DIAGNOSIS — C61 Malignant neoplasm of prostate: Secondary | ICD-10-CM

## 2016-08-31 DIAGNOSIS — I10 Essential (primary) hypertension: Secondary | ICD-10-CM | POA: Diagnosis not present

## 2016-08-31 DIAGNOSIS — K219 Gastro-esophageal reflux disease without esophagitis: Secondary | ICD-10-CM | POA: Diagnosis not present

## 2016-08-31 DIAGNOSIS — Z510 Encounter for antineoplastic radiation therapy: Secondary | ICD-10-CM | POA: Diagnosis not present

## 2016-08-31 DIAGNOSIS — E785 Hyperlipidemia, unspecified: Secondary | ICD-10-CM | POA: Diagnosis not present

## 2016-08-31 DIAGNOSIS — I252 Old myocardial infarction: Secondary | ICD-10-CM | POA: Diagnosis not present

## 2016-08-31 MED ORDER — POLYSACCHARIDE IRON COMPLEX 150 MG PO CAPS: 150.0000 mg | ORAL_CAPSULE | Freq: Every day | ORAL | 3 refills | Status: DC

## 2016-08-31 NOTE — Progress Notes (Signed)
Vitals stable. Six pound weight loss noted in 10 days. Did not receive radiation therapy on Friday due to cough and cold. Prescribed antibiotic by Hollace Kinnier. Also, patient found to have worsening anemia. Understand that Dr. Mariea Clonts plans to prescribe another medication for anemia but, unsure of the name of the new med. Reports GI upset with diarrhea. Reports decreased appetite. Denies blood in stool. Denies nocturia, dysuria, or hematuria. Reports urinary urgency with leakage. Reports a steady urine stream without difficulty emptying his bladder. Reports great fatigue.   BP (!) 145/60 (BP Location: Left Arm, Patient Position: Sitting, Cuff Size: Normal)   Pulse 78   Temp 97.9 F (36.6 C) (Oral)   Resp 18   Wt 251 lb 9.6 oz (114.1 kg)   SpO2 94%   BMI 39.41 kg/m  Wt Readings from Last 3 Encounters:  08/31/16 251 lb 9.6 oz (114.1 kg)  08/27/16 257 lb (116.6 kg)  08/21/16 257 lb 12.8 oz (116.9 kg)

## 2016-08-31 NOTE — Progress Notes (Signed)
  Radiation Oncology         [phone]   Name: Kevin Kane MRN: [id number]   Date: 08/31/2016  DOB: 10/24/1929     Weekly Radiation Therapy Management    ICD-9-CM ICD-10-CM   1. Malignant neoplasm of prostate (HCC) 185 C61     Current Dose: 23.4 Gy  Planned Dose:  75 Gy  Narrative The patient presents for routine under treatment assessment.  Vitals stable. 6 lbs weight loss noted in the last 10 days. The patient did not receive radiation therapy on Friday 08/28/16 due to cough and cold for which he was prescribed an antibiotic by  Dr. Hollace Kinnier. Additionally, the patient has been found to have worsening anemia. He reports that Dr. Mariea Clonts plans to prescribe him another medication for anemia but he is unsure of the name. The patient reports GI upset with diarrhea. Reports decreased appetite. Denies hematochezia, nocturia, dysuria, or hematuria. He reports urinary urgency with leakage, as well as a steady urine stream without difficulty emptying his bladder. The patient reports great fatigue. The patient is accompanied to the clinic today by his wife and daughter.  Set-up films were reviewed. The chart was checked.  Physical Findings  weight is 251 lb 9.6 oz (114.1 kg). His oral temperature is 97.9 F (36.6 C). His blood pressure is 145/60 (abnormal) and his pulse is 78. His respiration is 18 and oxygen saturation is 94%.  Weight essentially stable.  No significant changes. Uses walker to ambulate.  Impression The patient is tolerating radiation.  Plan Continue treatment as planned. I encouraged the patient to continue with his low fiber diet to lessen his risk of diarrhea. I also encouraged the patient to continue to drink more water and maintain his hydration. The patient has been provided with a low residue diet sheet.      Sheral Apley Tammi Klippel, M.D.  This document serves as a record of services personally performed by Tyler Pita, MD. It was created on his behalf by Maryla Morrow, a trained medical scribe. The creation of this record is based on the scribe's personal observations and the provider's statements to them. This document has been checked and approved by the attending provider.

## 2016-09-01 ENCOUNTER — Ambulatory Visit
Admission: RE | Admit: 2016-09-01 | Discharge: 2016-09-01 | Disposition: A | Discharge status: Home / Self Care | Payer: Medicare Other | Source: Ambulatory Visit | Attending: Radiation Oncology | Admitting: Radiation Oncology

## 2016-09-01 DIAGNOSIS — Z510 Encounter for antineoplastic radiation therapy: Secondary | ICD-10-CM | POA: Diagnosis not present

## 2016-09-01 DIAGNOSIS — E785 Hyperlipidemia, unspecified: Secondary | ICD-10-CM | POA: Diagnosis not present

## 2016-09-01 DIAGNOSIS — I10 Essential (primary) hypertension: Secondary | ICD-10-CM | POA: Diagnosis not present

## 2016-09-01 DIAGNOSIS — K219 Gastro-esophageal reflux disease without esophagitis: Secondary | ICD-10-CM | POA: Diagnosis not present

## 2016-09-01 DIAGNOSIS — C61 Malignant neoplasm of prostate: Secondary | ICD-10-CM | POA: Diagnosis not present

## 2016-09-01 DIAGNOSIS — I252 Old myocardial infarction: Secondary | ICD-10-CM | POA: Diagnosis not present

## 2016-09-02 ENCOUNTER — Ambulatory Visit
Admission: RE | Admit: 2016-09-02 | Discharge: 2016-09-02 | Disposition: A | Discharge status: Home / Self Care | Payer: Medicare Other | Source: Ambulatory Visit | Attending: Radiation Oncology | Admitting: Radiation Oncology

## 2016-09-02 ENCOUNTER — Encounter: Payer: Medicare Other | Admitting: Radiation Oncology

## 2016-09-02 DIAGNOSIS — E785 Hyperlipidemia, unspecified: Secondary | ICD-10-CM | POA: Diagnosis not present

## 2016-09-02 DIAGNOSIS — C61 Malignant neoplasm of prostate: Secondary | ICD-10-CM | POA: Diagnosis not present

## 2016-09-02 DIAGNOSIS — I10 Essential (primary) hypertension: Secondary | ICD-10-CM | POA: Diagnosis not present

## 2016-09-02 DIAGNOSIS — I252 Old myocardial infarction: Secondary | ICD-10-CM | POA: Diagnosis not present

## 2016-09-02 DIAGNOSIS — Z510 Encounter for antineoplastic radiation therapy: Secondary | ICD-10-CM | POA: Diagnosis not present

## 2016-09-02 DIAGNOSIS — K219 Gastro-esophageal reflux disease without esophagitis: Secondary | ICD-10-CM | POA: Diagnosis not present

## 2016-09-03 ENCOUNTER — Ambulatory Visit
Admission: RE | Admit: 2016-09-03 | Discharge: 2016-09-03 | Disposition: A | Discharge status: Home / Self Care | Payer: Medicare Other | Source: Ambulatory Visit | Attending: Radiation Oncology | Admitting: Radiation Oncology

## 2016-09-03 DIAGNOSIS — Z510 Encounter for antineoplastic radiation therapy: Secondary | ICD-10-CM | POA: Diagnosis not present

## 2016-09-03 DIAGNOSIS — C61 Malignant neoplasm of prostate: Secondary | ICD-10-CM | POA: Diagnosis not present

## 2016-09-03 DIAGNOSIS — I252 Old myocardial infarction: Secondary | ICD-10-CM | POA: Diagnosis not present

## 2016-09-03 DIAGNOSIS — E785 Hyperlipidemia, unspecified: Secondary | ICD-10-CM | POA: Diagnosis not present

## 2016-09-03 DIAGNOSIS — I10 Essential (primary) hypertension: Secondary | ICD-10-CM | POA: Diagnosis not present

## 2016-09-03 DIAGNOSIS — K219 Gastro-esophageal reflux disease without esophagitis: Secondary | ICD-10-CM | POA: Diagnosis not present

## 2016-09-04 ENCOUNTER — Ambulatory Visit
Admission: RE | Admit: 2016-09-04 | Discharge: 2016-09-04 | Disposition: A | Discharge status: Home / Self Care | Payer: Medicare Other | Source: Ambulatory Visit | Attending: Radiation Oncology | Admitting: Radiation Oncology

## 2016-09-04 VITALS — BP 138/62 | HR 60 | Resp 18 | Wt 249.6 lb

## 2016-09-04 DIAGNOSIS — C61 Malignant neoplasm of prostate: Secondary | ICD-10-CM

## 2016-09-04 DIAGNOSIS — Z510 Encounter for antineoplastic radiation therapy: Secondary | ICD-10-CM | POA: Diagnosis not present

## 2016-09-04 DIAGNOSIS — K219 Gastro-esophageal reflux disease without esophagitis: Secondary | ICD-10-CM | POA: Diagnosis not present

## 2016-09-04 DIAGNOSIS — I10 Essential (primary) hypertension: Secondary | ICD-10-CM | POA: Diagnosis not present

## 2016-09-04 DIAGNOSIS — E785 Hyperlipidemia, unspecified: Secondary | ICD-10-CM | POA: Diagnosis not present

## 2016-09-04 DIAGNOSIS — I252 Old myocardial infarction: Secondary | ICD-10-CM | POA: Diagnosis not present

## 2016-09-04 NOTE — Progress Notes (Signed)
Continued weight loss. Reports poor appetite.Denies nocturia, dysuria, or hematuria. Reports urinary urgency with leakage. Describes a steady urine stream without difficulty emptying his bladder. Reports diarrhea with bloody stool yesterday. Reports these symptoms resolved with Imodium. Reports fatigue.   BP 138/62 (BP Location: Right Arm, Patient Position: Sitting, Cuff Size: Normal) Comment (Cuff Size): manuel  Pulse 60   Resp 18   Wt 249 lb 9.6 oz (113.2 kg)   SpO2 100%   BMI 39.09 kg/m   Wt Readings from Last 3 Encounters:  09/04/16 249 lb 9.6 oz (113.2 kg)  08/31/16 251 lb 9.6 oz (114.1 kg)  08/27/16 257 lb (116.6 kg)

## 2016-09-04 NOTE — Progress Notes (Signed)
  Radiation Oncology         [phone]   Name: Kevin Kane MRN: [id number]   Date: 09/04/2016  DOB: May 11, 1930     Weekly Radiation Therapy Management    ICD-9-CM ICD-10-CM   1. Malignant neoplasm of prostate (HCC) 185 C61     Current Dose: 28.8 Gy  Planned Dose:  75 Gy  Narrative The patient presents for routine under treatment assessment.  Continued weight loss. Reports poor appetite. Denies nocturia, dysuria, or hematuria. Reports urinary urgency with leakage. Describes a steady urine stream without difficulty emptying his bladder. Reports diarrhea with bloody stool yesterday. Reports these symptoms resolved with Imodium. Reports fatigue.  Set-up films were reviewed. The chart was checked.  Physical Findings  weight is 249 lb 9.6 oz (113.2 kg). His blood pressure is 138/62 and his pulse is 60. His respiration is 18 and oxygen saturation is 100%.  8 lb weight loss since 06/29/16 (257 lbs). No significant changes. Uses walker to ambulate.  Impression The patient is tolerating radiation.  Plan Continue treatment as planned.      Sheral Apley Tammi Klippel, M.D.  This document serves as a record of services personally performed by Tyler Pita, MD. It was created on his behalf by Darcus Austin, a trained medical scribe. The creation of this record is based on the scribe's personal observations and the provider's statements to them. This document has been checked and approved by the attending provider.

## 2016-09-07 ENCOUNTER — Ambulatory Visit
Admission: RE | Admit: 2016-09-07 | Discharge: 2016-09-07 | Disposition: A | Discharge status: Home / Self Care | Payer: Medicare Other | Source: Ambulatory Visit | Attending: Radiation Oncology | Admitting: Radiation Oncology

## 2016-09-07 DIAGNOSIS — Z510 Encounter for antineoplastic radiation therapy: Secondary | ICD-10-CM | POA: Diagnosis not present

## 2016-09-07 DIAGNOSIS — K219 Gastro-esophageal reflux disease without esophagitis: Secondary | ICD-10-CM | POA: Diagnosis not present

## 2016-09-07 DIAGNOSIS — I10 Essential (primary) hypertension: Secondary | ICD-10-CM | POA: Diagnosis not present

## 2016-09-07 DIAGNOSIS — I252 Old myocardial infarction: Secondary | ICD-10-CM | POA: Diagnosis not present

## 2016-09-07 DIAGNOSIS — E785 Hyperlipidemia, unspecified: Secondary | ICD-10-CM | POA: Diagnosis not present

## 2016-09-07 DIAGNOSIS — C61 Malignant neoplasm of prostate: Secondary | ICD-10-CM | POA: Diagnosis not present

## 2016-09-08 ENCOUNTER — Ambulatory Visit
Admission: RE | Admit: 2016-09-08 | Discharge: 2016-09-08 | Disposition: A | Discharge status: Home / Self Care | Payer: Medicare Other | Source: Ambulatory Visit | Attending: Radiation Oncology | Admitting: Radiation Oncology

## 2016-09-08 DIAGNOSIS — Z510 Encounter for antineoplastic radiation therapy: Secondary | ICD-10-CM | POA: Diagnosis not present

## 2016-09-08 DIAGNOSIS — I252 Old myocardial infarction: Secondary | ICD-10-CM | POA: Diagnosis not present

## 2016-09-08 DIAGNOSIS — I10 Essential (primary) hypertension: Secondary | ICD-10-CM | POA: Diagnosis not present

## 2016-09-08 DIAGNOSIS — E785 Hyperlipidemia, unspecified: Secondary | ICD-10-CM | POA: Diagnosis not present

## 2016-09-08 DIAGNOSIS — K219 Gastro-esophageal reflux disease without esophagitis: Secondary | ICD-10-CM | POA: Diagnosis not present

## 2016-09-08 DIAGNOSIS — C61 Malignant neoplasm of prostate: Secondary | ICD-10-CM | POA: Diagnosis not present

## 2016-09-09 ENCOUNTER — Ambulatory Visit
Admission: RE | Admit: 2016-09-09 | Discharge: 2016-09-09 | Disposition: A | Discharge status: Home / Self Care | Payer: Medicare Other | Source: Ambulatory Visit | Attending: Radiation Oncology | Admitting: Radiation Oncology

## 2016-09-09 DIAGNOSIS — I252 Old myocardial infarction: Secondary | ICD-10-CM | POA: Diagnosis not present

## 2016-09-09 DIAGNOSIS — Z510 Encounter for antineoplastic radiation therapy: Secondary | ICD-10-CM | POA: Diagnosis not present

## 2016-09-09 DIAGNOSIS — K219 Gastro-esophageal reflux disease without esophagitis: Secondary | ICD-10-CM | POA: Diagnosis not present

## 2016-09-09 DIAGNOSIS — E785 Hyperlipidemia, unspecified: Secondary | ICD-10-CM | POA: Diagnosis not present

## 2016-09-09 DIAGNOSIS — C61 Malignant neoplasm of prostate: Secondary | ICD-10-CM | POA: Diagnosis not present

## 2016-09-09 DIAGNOSIS — I10 Essential (primary) hypertension: Secondary | ICD-10-CM | POA: Diagnosis not present

## 2016-09-09 NOTE — Progress Notes (Addendum)
Patient before treatment asked to have vitals taken by therapist he had told registration of light headedness, , took ortho static vitals,  No c/o pain BP (!) 151/46 (BP Location: Left Arm, Patient Position: Sitting, Cuff Size: Normal)   Pulse 81   Temp 98.1 F (36.7 C) (Oral)   Resp 20   SpO2 100% Comment: room air  BP (!) 105/50 (BP Location: Left Arm, Patient Position: Standing, Cuff Size: Normal)   Pulse 92   Temp 98.1 F (36.7 C) (Oral)   Resp 20   SpO2 100% Comment: room air  Patient had stated he has been light headed before starting radiation is  Chronic not acute, having diarrhea loose stools, not eating much except sandwiches, drinks fruit juices, advised to eat low fiber diet with diarrhea, asked if he had imodium  To take prn, he stated he took one last week and it stopped him up, will sit at side of bed first a minute before standing and wait a minute before walking   Will eat more protein in diet , and drink more water,and stay away from fresh fruit and fresh vegetables when having diarrhea Will get his radiation today 10:41 AM

## 2016-09-10 ENCOUNTER — Ambulatory Visit: Payer: Medicare Other

## 2016-09-11 ENCOUNTER — Ambulatory Visit: Payer: Medicare Other

## 2016-09-15 ENCOUNTER — Ambulatory Visit: Payer: Medicare Other

## 2016-09-16 ENCOUNTER — Ambulatory Visit
Admission: RE | Admit: 2016-09-16 | Discharge: 2016-09-16 | Disposition: A | Discharge status: Home / Self Care | Payer: Medicare Other | Source: Ambulatory Visit | Attending: Radiation Oncology | Admitting: Radiation Oncology

## 2016-09-16 DIAGNOSIS — I252 Old myocardial infarction: Secondary | ICD-10-CM | POA: Diagnosis not present

## 2016-09-16 DIAGNOSIS — C61 Malignant neoplasm of prostate: Secondary | ICD-10-CM | POA: Diagnosis not present

## 2016-09-16 DIAGNOSIS — Z510 Encounter for antineoplastic radiation therapy: Secondary | ICD-10-CM | POA: Diagnosis not present

## 2016-09-16 DIAGNOSIS — I10 Essential (primary) hypertension: Secondary | ICD-10-CM | POA: Diagnosis not present

## 2016-09-16 DIAGNOSIS — E785 Hyperlipidemia, unspecified: Secondary | ICD-10-CM | POA: Diagnosis not present

## 2016-09-16 DIAGNOSIS — K219 Gastro-esophageal reflux disease without esophagitis: Secondary | ICD-10-CM | POA: Diagnosis not present

## 2016-09-17 ENCOUNTER — Encounter: Payer: Self-pay | Admitting: Medical Oncology

## 2016-09-17 ENCOUNTER — Ambulatory Visit
Admission: RE | Admit: 2016-09-17 | Discharge: 2016-09-17 | Disposition: A | Discharge status: Home / Self Care | Payer: Medicare Other | Source: Ambulatory Visit | Attending: Radiation Oncology | Admitting: Radiation Oncology

## 2016-09-17 ENCOUNTER — Ambulatory Visit: Payer: Medicare Other

## 2016-09-17 DIAGNOSIS — I10 Essential (primary) hypertension: Secondary | ICD-10-CM | POA: Diagnosis not present

## 2016-09-17 DIAGNOSIS — K219 Gastro-esophageal reflux disease without esophagitis: Secondary | ICD-10-CM | POA: Diagnosis not present

## 2016-09-17 DIAGNOSIS — Z510 Encounter for antineoplastic radiation therapy: Secondary | ICD-10-CM | POA: Diagnosis not present

## 2016-09-17 DIAGNOSIS — C61 Malignant neoplasm of prostate: Secondary | ICD-10-CM | POA: Diagnosis not present

## 2016-09-17 DIAGNOSIS — I252 Old myocardial infarction: Secondary | ICD-10-CM | POA: Diagnosis not present

## 2016-09-17 DIAGNOSIS — E785 Hyperlipidemia, unspecified: Secondary | ICD-10-CM | POA: Diagnosis not present

## 2016-09-17 NOTE — Progress Notes (Signed)
Mr. Holstad is fatigued and has has some diarrhea. He has imodium but has only taken once due to it "stopped" him up. He complained of being light headed and his vitals were taken today. Encouraged to eat low fiber diet, increase fluids and take imodium for diarrhea.

## 2016-09-18 ENCOUNTER — Other Ambulatory Visit: Payer: Self-pay | Admitting: Internal Medicine

## 2016-09-18 ENCOUNTER — Ambulatory Visit
Admission: RE | Admit: 2016-09-18 | Discharge: 2016-09-18 | Disposition: A | Discharge status: Home / Self Care | Payer: Medicare Other | Source: Ambulatory Visit | Attending: Radiation Oncology | Admitting: Radiation Oncology

## 2016-09-18 VITALS — BP 152/67 | HR 65 | Temp 97.6°F | Resp 20 | Wt 249.2 lb

## 2016-09-18 DIAGNOSIS — E785 Hyperlipidemia, unspecified: Secondary | ICD-10-CM | POA: Diagnosis not present

## 2016-09-18 DIAGNOSIS — I10 Essential (primary) hypertension: Secondary | ICD-10-CM | POA: Diagnosis not present

## 2016-09-18 DIAGNOSIS — K219 Gastro-esophageal reflux disease without esophagitis: Secondary | ICD-10-CM | POA: Diagnosis not present

## 2016-09-18 DIAGNOSIS — C61 Malignant neoplasm of prostate: Secondary | ICD-10-CM

## 2016-09-18 DIAGNOSIS — Z510 Encounter for antineoplastic radiation therapy: Secondary | ICD-10-CM | POA: Diagnosis not present

## 2016-09-18 DIAGNOSIS — I252 Old myocardial infarction: Secondary | ICD-10-CM | POA: Diagnosis not present

## 2016-09-18 MED ORDER — LEVOTHYROXINE SODIUM 25 MCG PO TABS: 25.0000 ug | ORAL_TABLET | Freq: Every day | ORAL | 0 refills | Status: DC

## 2016-09-18 NOTE — Progress Notes (Signed)
PAIN: He is currently in no pain.  URINARY: Pt reports urinary urgency, hesistency, incontinence and dribbling. Pt states they urinate 0 - 1 times per night.  BOWEL: Pt reports Diarrhea x4 a day. Has Imodium on hand.   OTHER: Pt complains of fatigue BP (!) 152/67   Pulse 65   Temp 97.6 F (36.4 C) (Oral)   Resp 20   Wt 249 lb 3.2 oz (113 kg)   SpO2 100%   BMI 39.03 kg/m  Wt Readings from Last 3 Encounters:  09/18/16 249 lb 3.2 oz (113 kg)  09/04/16 249 lb 9.6 oz (113.2 kg)  08/31/16 251 lb 9.6 oz (114.1 kg)

## 2016-09-18 NOTE — Progress Notes (Signed)
  Radiation Oncology         [phone]   Name: Kevin Kane MRN: [id number]   Date: 09/18/2016  DOB: 1930-04-20     Weekly Radiation Therapy Management    ICD-9-CM ICD-10-CM   1. Malignant neoplasm of prostate (HCC) 185 C61     Current Dose: 41.4 Gy  Planned Dose:  75 Gy  Narrative The patient presents for routine under treatment assessment.  The patient denies pain currently. He reports urinary urgency, hesitancy, incontinence, and dribbling. He also reports diarrhea x 4 a day, and he uses Imodium to manage. He complains of fatigue. He asks if he should drink Ensure when he doesn't have much appetite. He is accompanied today by his wife.   Set-up films were reviewed. The chart was checked.  Physical Findings  weight is 249 lb 3.2 oz (113 kg). His oral temperature is 97.6 F (36.4 C). His blood pressure is 152/67 (abnormal) and his pulse is 65. His respiration is 20 and oxygen saturation is 100%.  No significant changes. Uses walker to ambulate.   Impression The patient is tolerating radiation.  Plan Continue treatment as planned. I encouraged the patient to try Ensure or Boost if he wants.       ------------------------------------------------  Jodelle Gross, MD, PhD  This document serves as a record of services personally performed by Kyung Rudd, MD. It was created on his behalf by Maryla Morrow, a trained medical scribe. The creation of this record is based on the scribe's personal observations and the provider's statements to them. This document has been checked and approved by the attending provider.

## 2016-09-18 NOTE — Telephone Encounter (Signed)
.  left message to have patient return my call. To discuss medication dosage of levothyroxine 8mcg or 25 mcg

## 2016-09-18 NOTE — Telephone Encounter (Signed)
rx sent to pharmacy by e-script  

## 2016-09-19 ENCOUNTER — Ambulatory Visit: Payer: Medicare Other

## 2016-09-20 ENCOUNTER — Ambulatory Visit: Payer: Medicare Other

## 2016-09-22 ENCOUNTER — Ambulatory Visit
Admission: RE | Admit: 2016-09-22 | Discharge: 2016-09-22 | Disposition: A | Discharge status: Home / Self Care | Payer: Medicare Other | Source: Ambulatory Visit | Attending: Radiation Oncology | Admitting: Radiation Oncology

## 2016-09-22 ENCOUNTER — Ambulatory Visit: Payer: Medicare Other

## 2016-09-22 DIAGNOSIS — M1990 Unspecified osteoarthritis, unspecified site: Secondary | ICD-10-CM | POA: Diagnosis not present

## 2016-09-22 DIAGNOSIS — Z888 Allergy status to other drugs, medicaments and biological substances status: Secondary | ICD-10-CM | POA: Diagnosis not present

## 2016-09-22 DIAGNOSIS — Z7951 Long term (current) use of inhaled steroids: Secondary | ICD-10-CM | POA: Diagnosis not present

## 2016-09-22 DIAGNOSIS — I252 Old myocardial infarction: Secondary | ICD-10-CM | POA: Diagnosis not present

## 2016-09-22 DIAGNOSIS — E785 Hyperlipidemia, unspecified: Secondary | ICD-10-CM | POA: Diagnosis not present

## 2016-09-22 DIAGNOSIS — Z955 Presence of coronary angioplasty implant and graft: Secondary | ICD-10-CM | POA: Diagnosis not present

## 2016-09-22 DIAGNOSIS — K219 Gastro-esophageal reflux disease without esophagitis: Secondary | ICD-10-CM | POA: Diagnosis not present

## 2016-09-22 DIAGNOSIS — I10 Essential (primary) hypertension: Secondary | ICD-10-CM | POA: Diagnosis not present

## 2016-09-22 DIAGNOSIS — Z87891 Personal history of nicotine dependence: Secondary | ICD-10-CM | POA: Diagnosis not present

## 2016-09-22 DIAGNOSIS — Z510 Encounter for antineoplastic radiation therapy: Secondary | ICD-10-CM | POA: Diagnosis not present

## 2016-09-22 DIAGNOSIS — Z823 Family history of stroke: Secondary | ICD-10-CM | POA: Diagnosis not present

## 2016-09-22 DIAGNOSIS — Z79899 Other long term (current) drug therapy: Secondary | ICD-10-CM | POA: Diagnosis not present

## 2016-09-22 DIAGNOSIS — C61 Malignant neoplasm of prostate: Secondary | ICD-10-CM | POA: Diagnosis not present

## 2016-09-22 DIAGNOSIS — J4520 Mild intermittent asthma, uncomplicated: Secondary | ICD-10-CM | POA: Diagnosis not present

## 2016-09-22 DIAGNOSIS — Z87442 Personal history of urinary calculi: Secondary | ICD-10-CM | POA: Diagnosis not present

## 2016-09-22 DIAGNOSIS — E039 Hypothyroidism, unspecified: Secondary | ICD-10-CM | POA: Diagnosis not present

## 2016-09-22 DIAGNOSIS — M069 Rheumatoid arthritis, unspecified: Secondary | ICD-10-CM | POA: Diagnosis not present

## 2016-09-22 DIAGNOSIS — Z7982 Long term (current) use of aspirin: Secondary | ICD-10-CM | POA: Diagnosis not present

## 2016-09-23 ENCOUNTER — Ambulatory Visit
Admission: RE | Admit: 2016-09-23 | Discharge: 2016-09-23 | Disposition: A | Discharge status: Home / Self Care | Payer: Medicare Other | Source: Ambulatory Visit | Attending: Radiation Oncology | Admitting: Radiation Oncology

## 2016-09-23 DIAGNOSIS — C61 Malignant neoplasm of prostate: Secondary | ICD-10-CM | POA: Diagnosis not present

## 2016-09-23 DIAGNOSIS — Z510 Encounter for antineoplastic radiation therapy: Secondary | ICD-10-CM | POA: Diagnosis not present

## 2016-09-23 DIAGNOSIS — I252 Old myocardial infarction: Secondary | ICD-10-CM | POA: Diagnosis not present

## 2016-09-23 DIAGNOSIS — I10 Essential (primary) hypertension: Secondary | ICD-10-CM | POA: Diagnosis not present

## 2016-09-23 DIAGNOSIS — K219 Gastro-esophageal reflux disease without esophagitis: Secondary | ICD-10-CM | POA: Diagnosis not present

## 2016-09-23 DIAGNOSIS — E785 Hyperlipidemia, unspecified: Secondary | ICD-10-CM | POA: Diagnosis not present

## 2016-09-24 ENCOUNTER — Ambulatory Visit: Payer: Medicare Other

## 2016-09-24 ENCOUNTER — Encounter: Payer: Self-pay | Admitting: Radiation Oncology

## 2016-09-24 ENCOUNTER — Ambulatory Visit
Admission: RE | Admit: 2016-09-24 | Discharge: 2016-09-24 | Disposition: A | Discharge status: Home / Self Care | Payer: Medicare Other | Source: Ambulatory Visit | Attending: Radiation Oncology | Admitting: Radiation Oncology

## 2016-09-24 VITALS — BP 137/58 | HR 77 | Temp 97.8°F | Resp 12 | Wt 250.0 lb

## 2016-09-24 DIAGNOSIS — C61 Malignant neoplasm of prostate: Secondary | ICD-10-CM

## 2016-09-24 DIAGNOSIS — K219 Gastro-esophageal reflux disease without esophagitis: Secondary | ICD-10-CM | POA: Diagnosis not present

## 2016-09-24 DIAGNOSIS — I252 Old myocardial infarction: Secondary | ICD-10-CM | POA: Diagnosis not present

## 2016-09-24 DIAGNOSIS — I10 Essential (primary) hypertension: Secondary | ICD-10-CM | POA: Diagnosis not present

## 2016-09-24 DIAGNOSIS — Z510 Encounter for antineoplastic radiation therapy: Secondary | ICD-10-CM | POA: Diagnosis not present

## 2016-09-24 DIAGNOSIS — E785 Hyperlipidemia, unspecified: Secondary | ICD-10-CM | POA: Diagnosis not present

## 2016-09-24 NOTE — Progress Notes (Signed)
PAIN: He is currently in no pain.  URINARY: Pt reports urinary frequency, urgency, hesistency, pain with urination, incontinence and dribbling. Pt states they urinate 0 - 1 times per night.  BOWEL: Pt reports Diarrhea-3-4 times a day.  Takes Imodium as needed OTHER: Pt complains of fatigue BP (!) 137/58   Pulse 77   Temp 97.8 F (36.6 C) (Oral)   Resp 12   Wt 250 lb (113.4 kg)   SpO2 98%   BMI 39.16 kg/m  Wt Readings from Last 3 Encounters:  09/24/16 250 lb (113.4 kg)  09/18/16 249 lb 3.2 oz (113 kg)  09/04/16 249 lb 9.6 oz (113.2 kg)

## 2016-09-24 NOTE — Progress Notes (Signed)
  Radiation Oncology         [phone]   Name: Kevin Kane MRN: [id number]   Date: 09/24/2016  DOB: 1930/09/19     Weekly Radiation Therapy Management    ICD-9-CM ICD-10-CM   1. Malignant neoplasm of prostate ([place]) 185 C61     Current Dose:47 Gy  Planned Dose:  75 Gy  Narrative The patient presents for routine under treatment assessment.   Weight and vitals stable. Patient denies pain. He reports urinary urgency, frequency, hesitancy, pain with urination, incontinence, and dribbling. He notes nocturia x 0-1. He also notes diarrhea x 3-4 daily for which he takes Immodium prn. Patient is positive for fatigue.  Set-up films were reviewed. The chart was checked.  Physical Findings  weight is 250 lb (113.4 kg). His oral temperature is 97.8 F (36.6 C). His blood pressure is 137/58 (abnormal) and his pulse is 77. His respiration is 12 and oxygen saturation is 98%.  No significant changes. Uses walker to ambulate.  Impression The patient is tolerating radiation.  Plan Continue treatment as planned.      Sheral Apley Tammi Klippel, M.D.  This document serves as a record of services personally performed by Tyler Pita, MD. It was created on his behalf by Bethann Humble, a trained medical scribe. The creation of this record is based on the scribe's personal observations and the provider's statements to them. This document has been checked and approved by the attending provider.

## 2016-09-25 ENCOUNTER — Ambulatory Visit
Admission: RE | Admit: 2016-09-25 | Discharge: 2016-09-25 | Disposition: A | Discharge status: Home / Self Care | Payer: Medicare Other | Source: Ambulatory Visit | Attending: Radiation Oncology | Admitting: Radiation Oncology

## 2016-09-25 ENCOUNTER — Ambulatory Visit: Admission: RE | Admit: 2016-09-25 | Payer: Medicare Other | Source: Ambulatory Visit | Admitting: Radiation Oncology

## 2016-09-25 DIAGNOSIS — I10 Essential (primary) hypertension: Secondary | ICD-10-CM | POA: Diagnosis not present

## 2016-09-25 DIAGNOSIS — K219 Gastro-esophageal reflux disease without esophagitis: Secondary | ICD-10-CM | POA: Diagnosis not present

## 2016-09-25 DIAGNOSIS — E785 Hyperlipidemia, unspecified: Secondary | ICD-10-CM | POA: Diagnosis not present

## 2016-09-25 DIAGNOSIS — I252 Old myocardial infarction: Secondary | ICD-10-CM | POA: Diagnosis not present

## 2016-09-25 DIAGNOSIS — Z510 Encounter for antineoplastic radiation therapy: Secondary | ICD-10-CM | POA: Diagnosis not present

## 2016-09-25 DIAGNOSIS — C61 Malignant neoplasm of prostate: Secondary | ICD-10-CM | POA: Diagnosis not present

## 2016-09-28 ENCOUNTER — Ambulatory Visit
Admission: RE | Admit: 2016-09-28 | Discharge: 2016-09-28 | Disposition: A | Discharge status: Home / Self Care | Payer: Medicare Other | Source: Ambulatory Visit | Attending: Radiation Oncology | Admitting: Radiation Oncology

## 2016-09-28 DIAGNOSIS — C61 Malignant neoplasm of prostate: Secondary | ICD-10-CM | POA: Diagnosis not present

## 2016-09-29 ENCOUNTER — Ambulatory Visit
Admission: RE | Admit: 2016-09-29 | Discharge: 2016-09-29 | Disposition: A | Discharge status: Home / Self Care | Payer: Medicare Other | Source: Ambulatory Visit | Attending: Radiation Oncology | Admitting: Radiation Oncology

## 2016-09-29 DIAGNOSIS — C61 Malignant neoplasm of prostate: Secondary | ICD-10-CM | POA: Diagnosis not present

## 2016-09-30 ENCOUNTER — Ambulatory Visit
Admission: RE | Admit: 2016-09-30 | Discharge: 2016-09-30 | Disposition: A | Discharge status: Home / Self Care | Payer: Medicare Other | Source: Ambulatory Visit | Attending: Radiation Oncology | Admitting: Radiation Oncology

## 2016-09-30 DIAGNOSIS — C61 Malignant neoplasm of prostate: Secondary | ICD-10-CM | POA: Diagnosis not present

## 2016-10-01 ENCOUNTER — Ambulatory Visit
Admission: RE | Admit: 2016-10-01 | Discharge: 2016-10-01 | Disposition: A | Discharge status: Home / Self Care | Payer: Medicare Other | Source: Ambulatory Visit | Attending: Radiation Oncology | Admitting: Radiation Oncology

## 2016-10-01 DIAGNOSIS — C61 Malignant neoplasm of prostate: Secondary | ICD-10-CM | POA: Diagnosis not present

## 2016-10-02 ENCOUNTER — Ambulatory Visit
Admission: RE | Admit: 2016-10-02 | Discharge: 2016-10-02 | Disposition: A | Discharge status: Home / Self Care | Payer: Medicare Other | Source: Ambulatory Visit | Attending: Radiation Oncology | Admitting: Radiation Oncology

## 2016-10-02 VITALS — BP 127/49 | HR 61 | Resp 18 | Wt 249.8 lb

## 2016-10-02 DIAGNOSIS — C61 Malignant neoplasm of prostate: Secondary | ICD-10-CM | POA: Diagnosis not present

## 2016-10-02 NOTE — Progress Notes (Signed)
  Radiation Oncology         [phone]   Name: Kevin Kane MRN: [id number]   Date: 10/02/2016  DOB: 08-27-1930     Weekly Radiation Therapy Management    ICD-9-CM ICD-10-CM   1. Malignant neoplasm of prostate (HCC) 185 C61     Current Dose: 59 Gy  Planned Dose:  75 Gy  Narrative The patient presents for routine under treatment assessment.  Weight stable. Diastolic BP low. Denies pain. Reports urinary frequency, urgency, hesitancy and occasional leakage. Reports diarrhea continues. Reports he takes Imodium occasionally. Reports mild dysuria on occasion. Denies hematuria. Reports feeling "woozie" on occasion. Patient confirms he is taking Flomax before breakfast. Nursing explained Flomax can cause hypotension and typically patients are directed to take it following supper. He reports diarrhea and fatigue yesterday.   Set-up films were reviewed. The chart was checked.  Physical Findings  weight is 249 lb 12.8 oz (113.3 kg). His blood pressure is 127/49 (abnormal) and his pulse is 61. His respiration is 18 and oxygen saturation is 99%.  Low diastolic BP noted. No significant changes. Uses walker to ambulate. Alert and in no acute distress.  Impression The patient is tolerating radiation.   Plan Continue treatment as planned. Aldona Bar NP will speak with the patient's urologist, Dr. Gaynelle Arabian, about the scheduling of his next Lupron injection.       Sheral Apley Tammi Klippel, M.D.  This document serves as a record of services personally performed by Tyler Pita, MD. It was created on his behalf by Arlyce Harman, a trained medical scribe. The creation of this record is based on the scribe's personal observations and the provider's statements to them. This document has been checked and approved by the attending provider.

## 2016-10-02 NOTE — Progress Notes (Signed)
Weight stable. Diastolic bp low. Denies pain. Reports urinary frequency, urgency, hesitancy and occasional leakage. Reports diarrhea continues. Reports he take Imodium occasionally. Reports mild dysuria on occasion. Denies hematuria. Reports feeling "woozie" on occasion. Patient confirms he is taking Flomax before breakfast. Explained Flomax can cause hypotension and typically patients are directed to take it following Supper.   BP (!) 127/49 (BP Location: Left Arm, Patient Position: Sitting, Cuff Size: Large)   Pulse 61   Resp 18   Wt 249 lb 12.8 oz (113.3 kg)   SpO2 99%   BMI 39.12 kg/m  Wt Readings from Last 3 Encounters:  10/02/16 249 lb 12.8 oz (113.3 kg)  09/24/16 250 lb (113.4 kg)  09/18/16 249 lb 3.2 oz (113 kg)

## 2016-10-05 ENCOUNTER — Ambulatory Visit
Admission: RE | Admit: 2016-10-05 | Discharge: 2016-10-05 | Disposition: A | Discharge status: Home / Self Care | Payer: Medicare Other | Source: Ambulatory Visit | Attending: Radiation Oncology | Admitting: Radiation Oncology

## 2016-10-05 ENCOUNTER — Telehealth: Payer: Self-pay | Admitting: *Deleted

## 2016-10-05 DIAGNOSIS — C61 Malignant neoplasm of prostate: Secondary | ICD-10-CM | POA: Diagnosis not present

## 2016-10-05 NOTE — Telephone Encounter (Signed)
Kim from [facility] returned call  About patient, his next Lupron injection due wek of March 19,2018, will forward to [facility], Financial controller 2:35 PM

## 2016-10-06 ENCOUNTER — Telehealth: Payer: Self-pay | Admitting: *Deleted

## 2016-10-06 ENCOUNTER — Ambulatory Visit
Admission: RE | Admit: 2016-10-06 | Discharge: 2016-10-06 | Disposition: A | Discharge status: Home / Self Care | Payer: Medicare Other | Source: Ambulatory Visit | Attending: Radiation Oncology | Admitting: Radiation Oncology

## 2016-10-06 DIAGNOSIS — C61 Malignant neoplasm of prostate: Secondary | ICD-10-CM | POA: Diagnosis not present

## 2016-10-06 NOTE — Telephone Encounter (Signed)
Called patient to inform of Lupron Inj. On 12-08-16- arrival time - 10 am @ Dr. Simone Curia Office, lvm for a return call

## 2016-10-07 ENCOUNTER — Ambulatory Visit: Payer: Medicare Other

## 2016-10-08 ENCOUNTER — Ambulatory Visit: Payer: Medicare Other

## 2016-10-08 ENCOUNTER — Ambulatory Visit
Admission: RE | Admit: 2016-10-08 | Discharge: 2016-10-08 | Disposition: A | Discharge status: Home / Self Care | Payer: Medicare Other | Source: Ambulatory Visit | Attending: Radiation Oncology | Admitting: Radiation Oncology

## 2016-10-08 DIAGNOSIS — C61 Malignant neoplasm of prostate: Secondary | ICD-10-CM | POA: Diagnosis not present

## 2016-10-09 ENCOUNTER — Ambulatory Visit
Admission: RE | Admit: 2016-10-09 | Discharge: 2016-10-09 | Disposition: A | Discharge status: Home / Self Care | Payer: Medicare Other | Source: Ambulatory Visit | Attending: Radiation Oncology | Admitting: Radiation Oncology

## 2016-10-09 VITALS — BP 134/50 | HR 75 | Resp 18 | Wt 250.8 lb

## 2016-10-09 DIAGNOSIS — C61 Malignant neoplasm of prostate: Secondary | ICD-10-CM | POA: Diagnosis not present

## 2016-10-09 NOTE — Progress Notes (Signed)
Weight stable. Diastolic bp continues to be low. Denies pain. Reports urinary urgency and occasional urinary leakage continues. Reports on occasion urination will stop half way through and when it restarts he feels dysuria.  Reports diarrhea continues. Reports taking Imodium prn. Reports he continues to take Flomax as directed. Reports "Woozieness" has resolved. Reports energy level is unchanged. Scheduled 12/08/16 with Gaynelle Arabian for next injection.  BP (!) 134/50 (BP Location: Right Arm, Patient Position: Sitting, Cuff Size: Large)   Pulse 75   Resp 18   Wt 250 lb 12.8 oz (113.8 kg)   SpO2 100%   BMI 39.28 kg/m  Wt Readings from Last 3 Encounters:  10/09/16 250 lb 12.8 oz (113.8 kg)  10/02/16 249 lb 12.8 oz (113.3 kg)  09/24/16 250 lb (113.4 kg)

## 2016-10-09 NOTE — Progress Notes (Signed)
  Radiation Oncology         [phone]   Name: Kevin Kane MRN: [id number]   Date: 10/09/2016  DOB: [birth year]     Weekly Radiation Therapy Management    ICD-9-CM ICD-10-CM   1. Malignant neoplasm of prostate (HCC) 185 C61     Current Dose: 67 Gy  Planned Dose:  75 Gy  Narrative The patient presents for routine under treatment assessment.  Diastolic BP continues to be low. Denies pain. Reports urinary urgency and occasional urinary leakage continues. Reports on occasion urination would stop half way through and when it restarts he feels dysuria. Reports diarrhea continues and takes Imodium PRN. Reports he continues to take Flomax as directed. Reports "woozieness" has resolved. Reports energy level is unchanged. Scheduled 12/08/16 with Gaynelle Arabian for his next ADT injection.  Set-up films were reviewed. The chart was checked.  Physical Findings  weight is 250 lb 12.8 oz (113.8 kg). His blood pressure is 134/50 (abnormal) and his pulse is 75. His respiration is 18 and oxygen saturation is 100%.  Low diastolic BP noted. No significant changes. Uses walker to ambulate. Alert and in no acute distress.  Impression The patient is tolerating radiation.   Plan Continue treatment as planned. The patient will return in 1 month for a follow up or sooner if he has worsening symptoms.      Sheral Apley Tammi Klippel, M.D.  This document serves as a record of services personally performed by Tyler Pita, MD. It was created on his behalf by Darcus Austin, a trained medical scribe. The creation of this record is based on the scribe's personal observations and the provider's statements to them. This document has been checked and approved by the attending provider.

## 2016-10-12 ENCOUNTER — Ambulatory Visit: Payer: Medicare Other

## 2016-10-12 ENCOUNTER — Other Ambulatory Visit: Payer: Self-pay | Admitting: Internal Medicine

## 2016-10-12 ENCOUNTER — Ambulatory Visit
Admission: RE | Admit: 2016-10-12 | Discharge: 2016-10-12 | Disposition: A | Discharge status: Home / Self Care | Payer: Medicare Other | Source: Ambulatory Visit | Attending: Radiation Oncology | Admitting: Radiation Oncology

## 2016-10-12 DIAGNOSIS — C61 Malignant neoplasm of prostate: Secondary | ICD-10-CM | POA: Diagnosis not present

## 2016-10-13 ENCOUNTER — Ambulatory Visit: Payer: Medicare Other

## 2016-10-13 ENCOUNTER — Ambulatory Visit
Admission: RE | Admit: 2016-10-13 | Discharge: 2016-10-13 | Disposition: A | Discharge status: Home / Self Care | Payer: Medicare Other | Source: Ambulatory Visit | Attending: Radiation Oncology | Admitting: Radiation Oncology

## 2016-10-13 DIAGNOSIS — C61 Malignant neoplasm of prostate: Secondary | ICD-10-CM | POA: Diagnosis not present

## 2016-10-13 NOTE — Progress Notes (Signed)
Ashlyn, PA reports she met with patient today and discussed in detail the plan to continue Casodex and Lupron.

## 2016-10-14 ENCOUNTER — Ambulatory Visit: Payer: Medicare Other

## 2016-10-14 ENCOUNTER — Ambulatory Visit
Admission: RE | Admit: 2016-10-14 | Discharge: 2016-10-14 | Disposition: A | Discharge status: Home / Self Care | Payer: Medicare Other | Source: Ambulatory Visit | Attending: Radiation Oncology | Admitting: Radiation Oncology

## 2016-10-14 DIAGNOSIS — C61 Malignant neoplasm of prostate: Secondary | ICD-10-CM | POA: Diagnosis not present

## 2016-10-15 ENCOUNTER — Encounter: Payer: Self-pay | Admitting: Radiation Oncology

## 2016-10-15 ENCOUNTER — Ambulatory Visit
Admission: RE | Admit: 2016-10-15 | Discharge: 2016-10-15 | Disposition: A | Discharge status: Home / Self Care | Payer: Medicare Other | Source: Ambulatory Visit | Attending: Radiation Oncology | Admitting: Radiation Oncology

## 2016-10-15 DIAGNOSIS — C61 Malignant neoplasm of prostate: Secondary | ICD-10-CM | POA: Diagnosis not present

## 2016-10-18 NOTE — Progress Notes (Signed)
  Radiation Oncology         [phone]) [phone] ________________________________  Name: Kevin Kane MRN: [id number]  Date: 10/15/2016  DOB: August 11, 1930  End of Treatment Note  Diagnosis:   83 y.o. gentleman with stage T2c adenocarcinoma of the prostate with a Gleason's of 4+5 and a PSA of 15.9     Indication for treatment:  Curative, Definitive Radiotherapy       Radiation treatment dates:   08/10/17-10/15/16  Site/dose:  1. The prostate, seminal vesicles, and pelvic lymph nodes were initially treated to 45 Gy in 25 fractions of 1.8 Gy  2. The prostate only was boosted to 75 Gy with 15 additional fractions of 2.0 Gy   Beams/energy:  1. The prostate, seminal vesicles, and pelvic lymph nodes were initially treated using VMAT intensity modulated radiotherapy delivering 6 megavolt photons. Image guidance was performed with CB-CT studies prior to each fraction. He was immobilized with a body fix lower extremity mold.  2. the prostate only was boosted using VMAT intensity modulated radiotherapy delivering 6 megavolt photons. Image guidance was performed with CB-CT studies prior to each fraction. He was immobilized with a body fix lower extremity mold.  Narrative: The patient tolerated radiation treatment relatively well.   The patient experienced some minor urinary irritation and modest fatigue.  Diastolic BP was low. Denied pain. Reported urinary urgency and occasional urinary leakage continued. Reported on occasion urination would stop half way through and when it restarted he felt dysuria. Reported diarrhea continued and took Imodium PRN. Reported he continued to take Flomax as directed. Reported "woozieness" had resolved. Reported energy level was unchanged.  Plan: The patient has completed radiation treatment. He will return to radiation oncology clinic for routine followup in one month. I advised him to call or return sooner if he has any questions or concerns related to his recovery or treatment.   Scheduled 12/08/16 with Gaynelle Arabian for his next ADT injection. ________________________________  Sheral Apley Tammi Klippel, M.D.

## 2016-11-12 NOTE — Progress Notes (Addendum)
[name] 83 y.o.man with stage T2c adenocarcinoma of the prostate with a Gleason's of 4+5 and a PSA of 15.9 completed radiation 10-15-16 FU.     Pain: He is currently denies having pain.    URINARY: He denies reports urinary frequency,urinary urgency,having incontinence and dribbling and burning sometimes  Denies hematuria.   Reports he is emptying his bladder.  Pt states he gets up to urinate x 2 times per night. BOWEL: He reports a  bowel movement everyday normal bowel movement sometimes it has bright red blood in the stool.   Diarrhea stools twice a week. Fatigue:Having fatigue around dinner time and SOB. Appetite:Decreased appetite eating three meals a day and snacks at night. Weight: Wt Readings from Last 3 Encounters:  11/18/16 252 lb 9.6 oz (114.6 kg)  10/09/16 250 lb 12.8 oz (113.8 kg)  10/02/16 249 lb 12.8 oz (113.3 kg)  BP (!) 145/57   Pulse 85   Temp 97.7 F (36.5 C) (Oral)   Resp 18   Ht 5\' 7"  (1.702 m)   Wt 252 lb 9.6 oz (114.6 kg)   SpO2 98%   BMI 39.56 kg/m  Urologist:Dr. Gaynelle Arabian Lupron injection: Every 6 months Last  Dose 09-17  Next   12-08-16                                                           PSA:15 ng/ml BP (!) 145/57   Pulse 85   Temp 97.7 F (36.5 C) (Oral)   Resp 18   Ht 5\' 7"  (1.702 m)   Wt 252 lb 9.6 oz (114.6 kg)   SpO2 98%   BMI 39.56 kg/m

## 2016-11-16 ENCOUNTER — Telehealth: Payer: Self-pay | Admitting: *Deleted

## 2016-11-16 NOTE — Telephone Encounter (Signed)
Ok to get cbc, cmp, psa, fasting lipid, hba1c  For anemia, prostate cancer, hyperlipidemia, hyperglycemia, chronic kidney disease To be done at [facility] [place] (arrange with clinic nurse) 8am on Thursday 3/1.

## 2016-11-16 NOTE — Telephone Encounter (Signed)
Patient called and stated that he has an appointment with you on 3/7 at [facility] and has concerns of Anemia. Patient is requesting a panel of bloodwork to be done before his appointment. Please Advise.

## 2016-11-17 NOTE — Telephone Encounter (Signed)
Spoke with patient and advised appt at [facility] [place]

## 2016-11-17 NOTE — Telephone Encounter (Signed)
Karena Addison will you add this to the books out there at [facility] for Thursday.

## 2016-11-18 ENCOUNTER — Ambulatory Visit
Admission: RE | Admit: 2016-11-18 | Discharge: 2016-11-18 | Disposition: A | Discharge status: Home / Self Care | Payer: Medicare Other | Source: Ambulatory Visit | Attending: Radiation Oncology | Admitting: Radiation Oncology

## 2016-11-18 ENCOUNTER — Encounter: Payer: Self-pay | Admitting: Radiation Oncology

## 2016-11-18 VITALS — BP 145/57 | HR 85 | Temp 97.7°F | Resp 18 | Ht 67.0 in | Wt 252.6 lb

## 2016-11-18 DIAGNOSIS — K625 Hemorrhage of anus and rectum: Secondary | ICD-10-CM | POA: Diagnosis not present

## 2016-11-18 DIAGNOSIS — C61 Malignant neoplasm of prostate: Secondary | ICD-10-CM | POA: Diagnosis not present

## 2016-11-18 DIAGNOSIS — Z7982 Long term (current) use of aspirin: Secondary | ICD-10-CM | POA: Diagnosis not present

## 2016-11-19 ENCOUNTER — Encounter: Payer: Self-pay | Admitting: Internal Medicine

## 2016-11-19 DIAGNOSIS — C61 Malignant neoplasm of prostate: Secondary | ICD-10-CM | POA: Diagnosis not present

## 2016-11-19 DIAGNOSIS — R739 Hyperglycemia, unspecified: Secondary | ICD-10-CM | POA: Diagnosis not present

## 2016-11-19 DIAGNOSIS — E785 Hyperlipidemia, unspecified: Secondary | ICD-10-CM | POA: Diagnosis not present

## 2016-11-19 DIAGNOSIS — D509 Iron deficiency anemia, unspecified: Secondary | ICD-10-CM | POA: Diagnosis not present

## 2016-11-19 DIAGNOSIS — N189 Chronic kidney disease, unspecified: Secondary | ICD-10-CM | POA: Diagnosis not present

## 2016-11-19 LAB — LIPID PANEL
CHOLESTEROL: 157 mg/dL (ref 0–200)
HDL: 76 mg/dL — AB (ref 35–70)
LDL Cholesterol: 67 mg/dL
TRIGLYCERIDES: 64 mg/dL (ref 40–160)

## 2016-11-19 LAB — BASIC METABOLIC PANEL
BUN: 33 mg/dL — AB (ref 4–21)
CREATININE: 1.4 mg/dL — AB (ref 0.6–1.3)
Glucose: 100 mg/dL
POTASSIUM: 4.8 mmol/L (ref 3.4–5.3)
Sodium: 144 mmol/L (ref 137–147)

## 2016-11-19 LAB — HEMOGLOBIN A1C: HEMOGLOBIN A1C: 5.2

## 2016-11-19 LAB — CBC AND DIFFERENTIAL
HCT: 25 % — AB (ref 41–53)
Hemoglobin: 7.9 g/dL — AB (ref 13.5–17.5)
Platelets: 348 10*3/uL (ref 150–399)
WBC: 12.5 10*3/mL

## 2016-11-19 LAB — PSA: PSA: 0.23

## 2016-11-19 LAB — HEPATIC FUNCTION PANEL
ALT: 21 U/L (ref 10–40)
AST: 20 U/L (ref 14–40)
Alkaline Phosphatase: 106 U/L (ref 25–125)
Bilirubin, Total: 0.4 mg/dL

## 2016-11-19 NOTE — Progress Notes (Signed)
Radiation Oncology         [phone]) [phone] ________________________________  Name: Kevin Kane MRN: [id number]  Date: 11/18/2016  DOB: April 24, 1930  Post Treatment Note  CC: [name], TIFFANY, DO  [name], [name], DO  Diagnosis:   83 y.o.gentleman with stage T2c adenocarcinoma of the prostate with a Gleason's of 4+5 and a PSA of 15.9     Interval Since Last Radiation:  4 weeks   08/10/17-10/15/16: 1. The prostate, seminal vesicles, and pelvic lymph nodes were initially treated to 45 Gy in 25 fractions of 1.8 Gy  2. The prostate only was boosted to 75 Gy with 15 additional fractions of 2.0 Gy    Narrative:  The patient returns today for routine follow-up.  During treatment he did have some more dribbling and urinary frequency than at baseline, though he believe this is somewhat better since completing treatment. He denies any blood in his urine. He reports some loose stools at times and has had a small amount of blood on toilet paper after wiping. He reports a history of hemorrhoids and recent colonoscopy screening was negative. He has rare urinary burning, and denies fevers, chills, or pelvic/flank pain. No other complaints are verbalized.  ALLERGIES:  is allergic to ketoconazole.  Meds: Current Outpatient Prescriptions  Medication Sig Dispense Refill  . acyclovir (ZOVIRAX) 400 MG tablet Take 400 mg by mouth every morning. Take one tablet daily for virus in eyes    . albuterol (PROAIR HFA) 108 (90 Base) MCG/ACT inhaler INHALE 2 PUFFS BY MOUTH EVERY 4 HOURS AS NEEDED FOR WHEEZING    . aspirin 81 MG tablet Take 81 mg by mouth daily.    Marland Kitchen atorvastatin (LIPITOR) 80 MG tablet Take 80 mg by mouth every morning. Take one daily for cholesterol    . bicalutamide (CASODEX) 50 MG tablet Take 50 mg by mouth daily.     . calcium carbonate (TUMS - DOSED IN MG ELEMENTAL CALCIUM) 500 MG chewable tablet Chew 1 tablet by mouth as needed for indigestion or heartburn.    . calcium citrate (CALCITRATE - DOSED  IN MG ELEMENTAL CALCIUM) 950 MG tablet Take 200 mg of elemental calcium by mouth daily.    . iron polysaccharides (NIFEREX) 150 MG capsule Take 1 capsule (150 mg total) by mouth daily. 30 capsule 3  . levothyroxine (SYNTHROID, LEVOTHROID) 25 MCG tablet TAKE 1 TABLET(25 MCG) BY MOUTH DAILY BEFORE BREAKFAST 30 tablet 11  . metoprolol succinate (TOPROL-XL) 50 MG 24 hr tablet Take 50 mg by mouth every morning. Take one tablet daily for blood pressure    . Multiple Vitamins-Minerals (CENTRUM SILVER ADULT 50+) TABS Take 1 tablet by mouth every morning.    . tamsulosin (FLOMAX) 0.4 MG CAPS capsule Take 0.4 mg by mouth daily after breakfast. Reported on 11/14/2015     No current facility-administered medications for this encounter.     Physical Findings:  height is 5\' 7"  (1.702 m) and weight is 252 lb 9.6 oz (114.6 kg). His oral temperature is 97.7 F (36.5 C). His blood pressure is 145/57 (abnormal) and his pulse is 85. His respiration is 18 and oxygen saturation is 98%.  Pain Assessment Pain Score: 0-No pain/10 In general this is a well appearing caucasian male in no acute distress. He's alert and oriented x4 and appropriate throughout the examination. Cardiopulmonary assessment is negative for acute distress and he exhibits normal effort.   Lab Findings: Lab Results  Component Value Date   WBC 7.7 08/27/2016  HGB 9.9 (L) 08/27/2016   HCT 30.2 (L) 08/27/2016   MCV 90.4 08/27/2016   PLT 213 08/27/2016     Radiographic Findings: No results found.  Impression/Plan: 1. 83 y.o.gentleman with stage T2c adenocarcinoma of the prostate with a Gleason's of 4+5 and a PSA of 15.9. The patient appears to be doing well overall and is recovering from the effects of radiotherapy. He is due for his next lupron injection on 12/08/16. We discussed the anticipated course of surveillance with urology, and we would be happy to see him back as needed moving forward. 2. Rectal bleeding. The patient has had a  history of hemorrhoids, and normal colonoscopy screening in the last few years. We did discuss  this could be due to radiation effect. He also has a history of iron deficiency anemia, and will be having labs performed by his PCP. He will contact us if he has persistent symptoms as we could consider anusol suppositories. At this time he will keep track of his symptoms, and let us know of any additional concerns he has.     Carola Rhine, PAC

## 2016-11-20 ENCOUNTER — Encounter: Payer: Self-pay | Admitting: *Deleted

## 2016-11-20 ENCOUNTER — Telehealth: Payer: Self-pay

## 2016-11-20 NOTE — Telephone Encounter (Signed)
Spoke with patient he expressed understanding of labs and he stated that he is taking the Nifirex daily. He wanted to know should he be doing anything differently until his appt. On 11/25/2016.

## 2016-11-20 NOTE — Telephone Encounter (Signed)
Left message for patient to return call to office.  Reason for call: Per Dr. Mariea Clonts labs Hgb has dropped 2 points from 08/27/2016 which is causing fatigue, SOB, and wooziness. Is he taking the Nifirex 150 iron table?  All other labs are good

## 2016-11-22 NOTE — Telephone Encounter (Signed)
No, I will address his anemia at the appointment.  Appears he will need a transfusion arranged if a repeat is just as low.

## 2016-11-23 NOTE — Telephone Encounter (Signed)
Patient called and stated that his hemoglobin is low and he is weak. He wants to know if he should be seen sooner that Wednesday. Would like your opinion. Please Advise.

## 2016-11-23 NOTE — Telephone Encounter (Signed)
Spoke with patient and advised Dr. Mariea Clonts will see him on Wednesday as scheduled, there are no openings on the schedule

## 2016-11-25 ENCOUNTER — Encounter: Payer: Self-pay | Admitting: Internal Medicine

## 2016-11-25 ENCOUNTER — Telehealth: Payer: Self-pay

## 2016-11-25 ENCOUNTER — Non-Acute Institutional Stay: Payer: Medicare Other | Admitting: Internal Medicine

## 2016-11-25 ENCOUNTER — Telehealth: Payer: Self-pay | Admitting: *Deleted

## 2016-11-25 VITALS — BP 138/80 | HR 75 | Temp 98.0°F | Wt 249.0 lb

## 2016-11-25 DIAGNOSIS — K520 Gastroenteritis and colitis due to radiation: Secondary | ICD-10-CM

## 2016-11-25 DIAGNOSIS — R195 Other fecal abnormalities: Secondary | ICD-10-CM

## 2016-11-25 DIAGNOSIS — D508 Other iron deficiency anemias: Secondary | ICD-10-CM

## 2016-11-25 DIAGNOSIS — C61 Malignant neoplasm of prostate: Secondary | ICD-10-CM | POA: Diagnosis not present

## 2016-11-25 DIAGNOSIS — R634 Abnormal weight loss: Secondary | ICD-10-CM

## 2016-11-25 DIAGNOSIS — D649 Anemia, unspecified: Secondary | ICD-10-CM | POA: Diagnosis not present

## 2016-11-25 NOTE — Telephone Encounter (Signed)
Message left on clinical intake voicemail:   This morning an order was faxed to [facility] for a blood transfusion.   Roslyn from the [facility] called to inform [facility] and Dr.[name] patient is no longer the care of Dr.Shadad. Patient will need to go to the hospital if he needs a blood transfusion.  [place] assistant called and was informed that patient needs to go to the hospital

## 2016-11-25 NOTE — Telephone Encounter (Signed)
Noted.  We will arrange for the transfusion at one of the other locations as patient does not desire to go to the emergency room.  Hopefully, we will have success at the other settings.

## 2016-11-25 NOTE — Progress Notes (Signed)
Location:  Occupational psychologist of Service:  Clinic (12)  Provider: Laszlo Ellerby L. Mariea Clonts, D.O., C.M.D.  Code Status:  Full code Goals of Care:  Advanced Directives 11/25/2016  Does Patient Have a Medical Advance Directive? Yes  Type of Advance Directive [place]  Does patient want to make changes to medical advance directive? -  Copy of [place] in Chart? Yes  Would patient like information on creating a medical advance directive? -   Chief Complaint  Patient presents with  . Medical Management of Chronic Issues    SOB discuss labs    HPI: Patient is a 83 y.o. male with CKD, COPD, GERD, RA, prostate cancer, and obesity seen today for medical management of chronic diseases--he is having increased shortness of breath and worsening anemia.  He's been getting radiation treatments for his prostate cancer.  Notes from Dr. 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 Hgb had dropped to 7.9 from 9.9 3 mos ago on most recent labs.  Reports radiation side effects are slowly dissipating, however.  Urinary dribble and urgency are getting better.  He's sometimes having normal bms, but other times diarrhea.  He says he cannot even walk across a small room without shortness of breath, wooziness, however.  Will sometimes have blood when he wipes himself, but not large volumes. He did also have a bloody nose 2 nights ago from 11pm to 4am (took that long to stop).  He bled for 5-10 mins.  His wife reports he did have blood in the toilet once when he had a bowel movement.  She is very anxious nervous and upset about his condition and wants him fixed immediately.    Weight 249 today and was 257 lbs in Dec when I saw him for an acute upper respiratory infection.   He agrees to pneumovax today but he left before getting it.    He has an appt with Dr. Gaynelle Arabian coming up next  week.  Past Medical History:  Diagnosis Date  . At risk for sleep apnea    STOP-BANG=5      SENT TO PCP 09-25-2014  . Bilateral lower extremity edema   . COPD mixed type [facility]) pulmologist-  dr Reginia Naas [facility])  note in epic under Care Everywhere tab)   per PFTs: 10/10/2013: Evidence of moderate obstructive lung disease with small airway involvement (asthma); FEV1 54% predicted, FEV1/FVC <70%, FEF25-75% is reduced. Mild response to bronchodilator; FEV1 w/ 8% change after bronchodilator.    -- and  mild intermittant asthma  . Coronary atherosclerosis of native coronary artery    cardiologist-  dr schwengel  in [place], [place] (where pt lives during spring and summer)  . Eczema   . Elevated prostate specific antigen (PSA) 10/10/2006  . First degree heart block   . Frequency of urination   . GERD (gastroesophageal reflux disease)   . Herpes simplex of eye    bilateral eye-  takes acyclovir daily  . History of kidney stones   . History of MI (myocardial infarction)    2001--  s/p  PCI and stent  . Hyperlipidemia   . Hypertension   . Hypothyroidism   . Mild intermittent asthma   . Nephrolithiasis    right  . OA (osteoarthritis)   . Prostate cancer ([place])   . Rheumatoid arthritis ([place]) 05/2013  . Right ureteral stone   .  Urgency of urination   . Wears glasses   . Wears hearing aid    bilateral    Past Surgical History:  Procedure Laterality Date  . CATARACT EXTRACTION W/ INTRAOCULAR LENS  IMPLANT, BILATERAL Bilateral 2014  . COLONOSCOPY  2010  . CORONARY ANGIOPLASTY WITH STENT PLACEMENT  2001   ([address] [place])   PCI and stenting  . CYSTOSCOPY WITH RETROGRADE PYELOGRAM, URETEROSCOPY AND STENT PLACEMENT Right 09/30/2015   Procedure: RIGHT URETEROSCOPY, RETROGRADE PYELOGRAM, LASER LITHOTRIPSY AND STENT PLACEMENT;  Surgeon: Kathie Rhodes, MD;  Location: [facility];  Service: Urology;  Laterality: Right;  . CYSTOSCOPY/URETEROSCOPY/HOLMIUM LASER/STENT PLACEMENT Right  11/04/2015   Procedure: RIGHT URETEROSCOPY/RETROGRADE PYELOGRAM/HOLMIUM LASER LITHOTRIPSY/STENT PLACEMENT;  Surgeon: Kathie Rhodes, MD;  Location: [facility];  Service: Urology;  Laterality: Right;  . PILONIDAL CYST EXCISION  2006  . TONSILLECTOMY  as child  . TOTAL HIP ARTHROPLASTY Left 2001    Allergies  Allergen Reactions  . Ketoconazole Other (See Comments)    Adverse skin reaction-- legs turned red and purple    Allergies as of 11/25/2016      Reactions   Ketoconazole Other (See Comments)   Adverse skin reaction-- legs turned red and purple      Medication List       Accurate as of 11/25/16 10:43 AM. Always use your most recent med list.          acyclovir 400 MG tablet Commonly known as:  ZOVIRAX Take 400 mg by mouth every morning. Take one tablet daily for virus in eyes   aspirin 81 MG tablet Take 81 mg by mouth daily.   atorvastatin 80 MG tablet Commonly known as:  LIPITOR Take 80 mg by mouth every morning. Take one daily for cholesterol   bicalutamide 50 MG tablet Commonly known as:  CASODEX Take 50 mg by mouth daily.   calcium carbonate 500 MG chewable tablet Commonly known as:  TUMS - dosed in mg elemental calcium Chew 1 tablet by mouth as needed for indigestion or heartburn.   calcium citrate 950 MG tablet Commonly known as:  CALCITRATE - dosed in mg elemental calcium Take 200 mg of elemental calcium by mouth daily.   CENTRUM SILVER ADULT 50+ Tabs Take 1 tablet by mouth every morning.   iron polysaccharides 150 MG capsule Commonly known as:  NIFEREX Take 1 capsule (150 mg total) by mouth daily.   levothyroxine 25 MCG tablet Commonly known as:  SYNTHROID, LEVOTHROID TAKE 1 TABLET(25 MCG) BY MOUTH DAILY BEFORE BREAKFAST   metoprolol succinate 50 MG 24 hr tablet Commonly known as:  TOPROL-XL Take 50 mg by mouth every morning. Take one tablet daily for blood pressure   PROAIR HFA 108 (90 Base) MCG/ACT inhaler Generic drug:   albuterol INHALE 2 PUFFS BY MOUTH EVERY 4 HOURS AS NEEDED FOR WHEEZING   tamsulosin 0.4 MG Caps capsule Commonly known as:  FLOMAX Take 0.4 mg by mouth daily after breakfast. Reported on 11/14/2015       Review of Systems:  Review of Systems  Constitutional: Positive for malaise/fatigue and weight loss. Negative for chills, diaphoresis and fever.  HENT: Positive for hearing loss.   Eyes: Negative for blurred vision.  Respiratory: Positive for shortness of breath. Negative for cough, sputum production and wheezing.        Does use albuterol as needed  Cardiovascular: Negative for chest pain, palpitations, orthopnea, claudication, leg swelling and PND.  Gastrointestinal: Positive for abdominal pain, blood in stool,  diarrhea and nausea. Negative for constipation, melena and vomiting.       Abdominal pain with iron and nausea  Genitourinary: Negative for dysuria.       Some residual dribbling and urgency  Musculoskeletal: Negative for falls.       Unsteady gait, chronic hip and back pain  Skin: Negative for itching and rash.  Neurological: Positive for dizziness, tingling, sensory change and weakness. Negative for loss of consciousness.  Endo/Heme/Allergies: Bruises/bleeds easily.       Small abrasion right shin  Psychiatric/Behavioral: Negative for depression. The patient does not have insomnia.     Health Maintenance  Topic Date Due  . PNA vac Low Risk Adult (2 of 2 - PPSV23) 04/19/2011  . INFLUENZA VACCINE  04/21/2016  . TETANUS/TDAP  03/20/2025    Physical Exam: Vitals:   11/25/16 0850  BP: 138/80  Pulse: 75  Temp: 98 F (36.7 C)  TempSrc: Oral  SpO2: 95%  Weight: 249 lb (112.9 kg)   Body mass index is 39 kg/m. Physical Exam  Constitutional: He is oriented to person, place, and time. He appears well-developed and well-nourished. No distress.  HENT:  Head: Normocephalic and atraumatic.  Cardiovascular: Normal rate, regular rhythm, normal heart sounds and intact  distal pulses.   Pulmonary/Chest: Effort normal and breath sounds normal. No respiratory distress.  Abdominal: Soft. Bowel sounds are normal. He exhibits no distension. There is no tenderness.  Genitourinary: Rectal exam shows guaiac positive stool.  Genitourinary Comments: Some mild hemorrhoids, heme positive stool  Musculoskeletal: Normal range of motion.  Stooped posture and uses walker  Neurological: He is alert and oriented to person, place, and time.  Skin: Skin is warm and dry. Capillary refill takes less than 2 seconds. There is pallor.  Psychiatric: He has a normal mood and affect.    Labs reviewed: Basic Metabolic Panel:  Recent Labs  12/19/15 08/27/16 1633 11/19/16  NA 146 143 144  K 4.7 4.4 4.8  CL  --  108  --   CO2  --  27  --   GLUCOSE  --  106*  --   BUN 35* 28* 33*  CREATININE 1.6* 1.65* 1.4*  CALCIUM  --  9.1  --    Liver Function Tests:  Recent Labs  08/27/16 1633 11/19/16  AST 20 20  ALT 21 21  ALKPHOS 86 106  BILITOT 0.6  --   PROT 5.8*  --   ALBUMIN 3.7  --    No results for input(s): LIPASE, AMYLASE in the last 8760 hours. No results for input(s): AMMONIA in the last 8760 hours. CBC:  Recent Labs  12/19/15 08/27/16 1633 11/19/16  WBC 12.6 7.7 12.5  NEUTROABS  --  6,622  --   HGB 10.9* 9.9* 7.9*  HCT 34* 30.2* 25*  MCV  --  90.4  --   PLT 371 213 348   Lipid Panel:  Recent Labs  11/19/16  CHOL 157  HDL 76*  LDLCALC 67  TRIG 64   Lab Results  Component Value Date   HGBA1C 5.2 11/19/2016    Assessment/Plan 1. Symptomatic anemia -I want him to get a transfusion as soon as possible due to his fatigue, shortness of breath (minimal distance with severe dyspnea on exertion), "wooziness" and unsteady gait -his latest hgb is 7.9 and was 9.9 3 mos ago near the beginning of his radiation for prostate cancer -we called the hematology/oncology office to get him a transfusion at the infusion center  there and were informed that the  physician he saw there would need to review his labs and possibly have him come for more labs before he could be transfused there and we were told of other locations for the possible transfusion -pt was waiting to hear back and called several times asking about the results (4 times I believe)--he reports that what we told him (above) was not true and he needs this done asap and he now remembers all about what transfusions are like (this was after he was asked me how long it took and if it went in his arm, etc this am) -CMA is going to try to arrange transfusion at the [facility] site tomorrow -this process should be so much easier--pt wants to avoid an ED visit and costs associated and clearly has symptomatic anemia  2. 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 Fecal occult blood test positive -likely due to #2, but pt has had baseline anemia and previously put off further eval until he could be in [place] for the summer -he is still on daily iron (I'm not so certain he's actually been taking it)  4. Prostate cancer [facility]) -has completed XRT with Dr. Tammi Klippel -side effects slowly improving, but now having symptomatic anemia  5. Other iron deficiency anemia -suspected to be due to #2, but has some baseline CKD also--may need nephrology referral -will plan 2 units PRBCs transfusion ideally tomorrow am, then f/u cbc next week and monthly to monitor (of course they'll be gone to [place] again soon)  6. Weight loss -due to prostate cancer, radiation colitis  Labs/tests ordered:  Transfusion of 2 units prbcs at infusion center wherever they will take him  Next appt:  Needs to return in 1 month  Worth Kober L. Darden Flemister, D.O. [facility] [address], [place]  [address] Cell Phone (Mon-Fri 8am-5pm):  [phone] On Call:  [phone] & follow prompts after 5pm & weekends Office Phone:  [phone] Office Fax:  [phone]

## 2016-11-25 NOTE — Telephone Encounter (Signed)
"  Where do we stand on my transfusion.  I talked with the nurses at [facility] who report calling the office for me to get a transfusion today.  I have an engagement already tomorrow at 12:00.  If not today I'll have to come on Friday as it takes three to four hours for transfusion.  The radiation causes low HGB.  Google it and you'll see..  Return number [phone]."   Will notify provider of patient's call.

## 2016-11-25 NOTE — Telephone Encounter (Signed)
Patient has called several more times.  CMA to try to set up the transfusion at one of those other sites in the am as pt refuses to go to the ED which makes sense to me.

## 2016-11-25 NOTE — Telephone Encounter (Signed)
Verbal order received and read back from Dr. Alen Blew  for no transfusion from [facility].  Called to provide order given to [facility] at this time.  [facility] twice.  Dr. Mariea Clonts nor live staff member not available, received voicemail twice.   Called patient explaining his anemia  Will need to be handled by Dr. Joneen Caraway.  Not followed a this office for anemia.  Level of care needed not a radiation order.  Earlier today this nurse provided the [facility] with [facility] office number along with [facility] and [facility] phone numbers for [facility] to order and set up transfusion.  Patient thanked me for this information.

## 2016-11-25 NOTE — Telephone Encounter (Signed)
"  This is Triage of the [facility] [phone]) calling to schedule blood transfusion for this patient.  What steps do we need to take to schedule this?  He see's Shona Simpson.  HGB = 7.9.  Has shortness of breath, weak and light headedness."   Will notify provider.  Provided fax number for POD 5 for lab results to be faxed.  Advised go to hospital if in distress.  Well [facility] Triage nor [facility] Triage can schedule transfusion.  Will notify provider.  Answered question of other transfusion areas within [facility].

## 2016-11-26 ENCOUNTER — Other Ambulatory Visit: Payer: Self-pay | Admitting: *Deleted

## 2016-11-26 ENCOUNTER — Telehealth: Payer: Self-pay | Admitting: Oncology

## 2016-11-26 ENCOUNTER — Ambulatory Visit (HOSPITAL_COMMUNITY)
Admission: RE | Admit: 2016-11-26 | Discharge: 2016-11-26 | Disposition: A | Discharge status: Home / Self Care | Payer: Medicare Other | Source: Ambulatory Visit | Attending: Oncology | Admitting: Oncology

## 2016-11-26 ENCOUNTER — Other Ambulatory Visit: Payer: Self-pay | Admitting: Oncology

## 2016-11-26 ENCOUNTER — Ambulatory Visit (HOSPITAL_BASED_OUTPATIENT_CLINIC_OR_DEPARTMENT_OTHER): Payer: Medicare Other

## 2016-11-26 ENCOUNTER — Ambulatory Visit (HOSPITAL_BASED_OUTPATIENT_CLINIC_OR_DEPARTMENT_OTHER): Payer: Medicare Other | Admitting: Oncology

## 2016-11-26 VITALS — BP 168/50 | HR 74 | Temp 97.8°F | Resp 18 | Ht 67.0 in | Wt 249.0 lb

## 2016-11-26 DIAGNOSIS — D649 Anemia, unspecified: Secondary | ICD-10-CM

## 2016-11-26 DIAGNOSIS — D509 Iron deficiency anemia, unspecified: Secondary | ICD-10-CM

## 2016-11-26 DIAGNOSIS — E538 Deficiency of other specified B group vitamins: Secondary | ICD-10-CM

## 2016-11-26 DIAGNOSIS — C61 Malignant neoplasm of prostate: Secondary | ICD-10-CM

## 2016-11-26 LAB — CBC WITH DIFFERENTIAL/PLATELET
BASO%: 0.2 % (ref 0.0–2.0)
Basophils Absolute: 0 10*3/uL (ref 0.0–0.1)
EOS ABS: 0.3 10*3/uL (ref 0.0–0.5)
EOS%: 2.7 % (ref 0.0–7.0)
HCT: 21.9 % — ABNORMAL LOW (ref 38.4–49.9)
HGB: 6.8 g/dL — CL (ref 13.0–17.1)
LYMPH%: 4.7 % — AB (ref 14.0–49.0)
MCH: 28.8 pg (ref 27.2–33.4)
MCHC: 31.1 g/dL — AB (ref 32.0–36.0)
MCV: 92.8 fL (ref 79.3–98.0)
MONO#: 1.1 10*3/uL — AB (ref 0.1–0.9)
MONO%: 11.1 % (ref 0.0–14.0)
NEUT#: 8 10*3/uL — ABNORMAL HIGH (ref 1.5–6.5)
NEUT%: 81.3 % — AB (ref 39.0–75.0)
PLATELETS: 355 10*3/uL (ref 140–400)
RBC: 2.36 10*6/uL — AB (ref 4.20–5.82)
RDW: 14.9 % — ABNORMAL HIGH (ref 11.0–14.6)
WBC: 9.9 10*3/uL (ref 4.0–10.3)
lymph#: 0.5 10*3/uL — ABNORMAL LOW (ref 0.9–3.3)

## 2016-11-26 LAB — FERRITIN: Ferritin: 58 ng/ml (ref 22–316)

## 2016-11-26 LAB — COMPREHENSIVE METABOLIC PANEL
ALT: 19 U/L (ref 0–55)
ANION GAP: 10 meq/L (ref 3–11)
AST: 20 U/L (ref 5–34)
Albumin: 3.2 g/dL — ABNORMAL LOW (ref 3.5–5.0)
Alkaline Phosphatase: 102 U/L (ref 40–150)
BUN: 32.3 mg/dL — ABNORMAL HIGH (ref 7.0–26.0)
CHLORIDE: 110 meq/L — AB (ref 98–109)
CO2: 27 meq/L (ref 22–29)
Calcium: 9.7 mg/dL (ref 8.4–10.4)
Creatinine: 1.8 mg/dL — ABNORMAL HIGH (ref 0.7–1.3)
EGFR: 34 mL/min/{1.73_m2} — AB (ref >90)
Glucose: 97 mg/dl (ref 70–140)
Potassium: 4.1 mEq/L (ref 3.5–5.1)
Sodium: 146 mEq/L — ABNORMAL HIGH (ref 136–145)
Total Bilirubin: 0.52 mg/dL (ref 0.20–1.20)
Total Protein: 6.7 g/dL (ref 6.4–8.3)

## 2016-11-26 LAB — IRON AND TIBC
%SAT: 23 % (ref 20–55)
IRON: 80 ug/dL (ref 42–163)
TIBC: 355 ug/dL (ref 202–409)
UIBC: 275 ug/dL (ref 117–376)

## 2016-11-26 LAB — CHCC SMEAR

## 2016-11-26 LAB — PREPARE RBC (CROSSMATCH)

## 2016-11-26 LAB — ABO/RH: ABO/RH(D): O POS

## 2016-11-26 NOTE — Progress Notes (Signed)
Hematology and Oncology Follow Up Visit  KASEM MOZER [id number] 05-Jun-1930 83 y.o. 11/26/2016 12:37 PM [name], TIFFANY, DOReed, [name], DO   Principle Diagnosis: 83 year old gentleman with the following diagnoses:  1. Prostate cancer diagnosed in September 2017. He had a Gleason score 4+5 = 9 clinical stage T2c. his PSA was 15.9.  2. Multifactorial anemia workup is ongoing diagnosed in March 2018.   Prior Therapy:   He is status post androgen deprivation therapy followed by radiation therapy completed in December 2017.  Current therapy: Under evaluation for ongoing anemia.  Interim History:  Mr. Hintz presents today for a follow-up visit. Since the last visit, he completed that radiation therapy reasonably well without any major complications. He did not report any hematuria or dysuria. He has been noticing excessive fatigue and tiredness and was evaluated by primary care physician and was noted to be anemic a hemoglobin of 7.9 on 11/19/2016. His white cell count was 12.5 and platelet count of 348. His hemoglobin was 9.9 in 08/27/2016. He is becoming more symptomatic with dyspnea on exertion but no chest pain or dyspnea at rest.  He does not report any headaches, blurry vision, syncope or seizures. He does not report any fevers, chills, sweats or weight loss. He isn't report any chest pain, palpitation, orthopnea or leg edema. He does not report any cough, wheezing or hemoptysis. He does not report any nausea, vomiting or abdominal pain. He does not report any frequency urgency or hesitancy. He does not report any hematuria or dysuria. He does not report any skeletal complaints. Remaining review of systems unremarkable.   Medications: I have reviewed the patient's current medications.  Current Outpatient Prescriptions  Medication Sig Dispense Refill  . acyclovir (ZOVIRAX) 400 MG tablet Take 400 mg by mouth every morning. Take one tablet daily for virus in eyes    . albuterol (PROAIR HFA)  108 (90 Base) MCG/ACT inhaler INHALE 2 PUFFS BY MOUTH EVERY 4 HOURS AS NEEDED FOR WHEEZING    . aspirin 81 MG tablet Take 81 mg by mouth daily.    Marland Kitchen atorvastatin (LIPITOR) 80 MG tablet Take 80 mg by mouth every morning. Take one daily for cholesterol    . bicalutamide (CASODEX) 50 MG tablet Take 50 mg by mouth daily.     . calcium carbonate (TUMS - DOSED IN MG ELEMENTAL CALCIUM) 500 MG chewable tablet Chew 1 tablet by mouth as needed for indigestion or heartburn.    . calcium citrate (CALCITRATE - DOSED IN MG ELEMENTAL CALCIUM) 950 MG tablet Take 200 mg of elemental calcium by mouth daily.    . iron polysaccharides (NIFEREX) 150 MG capsule Take 1 capsule (150 mg total) by mouth daily. 30 capsule 3  . levothyroxine (SYNTHROID, LEVOTHROID) 25 MCG tablet TAKE 1 TABLET(25 MCG) BY MOUTH DAILY BEFORE BREAKFAST 30 tablet 11  . metoprolol succinate (TOPROL-XL) 50 MG 24 hr tablet Take 50 mg by mouth every morning. Take one tablet daily for blood pressure    . Multiple Vitamins-Minerals (CENTRUM SILVER ADULT 50+) TABS Take 1 tablet by mouth every morning.    . tamsulosin (FLOMAX) 0.4 MG CAPS capsule Take 0.4 mg by mouth daily after breakfast. Reported on 11/14/2015     No current facility-administered medications for this visit.      Allergies:  Allergies  Allergen Reactions  . Ketoconazole Other (See Comments)    Adverse skin reaction-- legs turned red and purple    Past Medical History, Surgical history, Social history, and Family  History were reviewed and updated.  Review of Systems:  Physical Exam: Blood pressure (!) 168/50, pulse 74, temperature 97.8 F (36.6 C), temperature source Oral, resp. rate 18, height 5' 7"  (1.702 m), weight 249 lb (112.9 kg), SpO2 99 %. ECOG: 1 General appearance: alert and cooperative appears slightly pale. Head: Normocephalic, without obvious abnormality Neck: no adenopathy Lymph nodes: Cervical, supraclavicular, and axillary nodes normal. Heart:regular rate  and rhythm, S1, S2 normal, no murmur, click, rub or gallop Lung:chest clear, no wheezing, rales, normal symmetric air entry Abdomin: soft, non-tender, without masses or organomegaly EXT:no erythema, induration, or nodules without edema.   Lab Results: Lab Results  Component Value Date   WBC 9.9 11/26/2016   HGB 6.8 (LL) 11/26/2016   HCT 21.9 (L) 11/26/2016   MCV 92.8 11/26/2016   PLT 355 11/26/2016     Chemistry      Component Value Date/Time   NA 144 11/19/2016   K 4.8 11/19/2016   CL 108 08/27/2016 1633   CO2 27 08/27/2016 1633   BUN 33 (A) 11/19/2016   CREATININE 1.4 (A) 11/19/2016   CREATININE 1.65 (H) 08/27/2016 1633   GLU 100 11/19/2016      Component Value Date/Time   CALCIUM 9.1 08/27/2016 1633   ALKPHOS 106 11/19/2016   AST 20 11/19/2016   ALT 21 11/19/2016   BILITOT 0.6 08/27/2016 1633       Impression and Plan:  83 year old gentleman with the following issues:  1. Prostate cancer diagnosed in August 2017. He presented with a PSA of 15.9 and clinical stage TIIc. His prostate biopsy showed a Gleason score 4+5 = 9 with pattern 4+4 = 8 also noted. 11 out of 12 cores were involved with cancer. Staging workup did not show any evidence of metastatic disease and he has very little urinary tract symptoms at this time.  He completed definitive radiation therapy after androgen deprivation therapy in December 2018. His PSA on 11/19/2016 was 0.23.  2. Multifactorial anemia with worsening symptoms in the last few weeks. His hemoglobin today 6.8 with the normal MCV. His creatinine is 1.65.  The differential diagnosis was reviewed today with the patient and his wife. This would include anemia of iron deficiency, renal disease, chronic disease, myelosuppression from radiation therapy, myelodysplastic syndrome, plasma cell disorder among others. I feel obtained workup to rule these conditions out for the time being and we'll set him up with a packed red cell transfusion given  his recent symptoms. He will receive transfusion on 11/27/2016.  He might require a bone marrow biopsy to further investigate these findings if his blood work is unrevealing. Growth factor support might be an option as well if his related to anemia of renal disease.   3. Follow-up: Will be in the next few weeks to follow his progress.   Zola Button, MD 3/8/201812:37 PM

## 2016-11-26 NOTE — Telephone Encounter (Signed)
sw pt to confirm 1145 am  appt per LOS

## 2016-11-27 ENCOUNTER — Telehealth: Payer: Self-pay | Admitting: Oncology

## 2016-11-27 ENCOUNTER — Ambulatory Visit (HOSPITAL_BASED_OUTPATIENT_CLINIC_OR_DEPARTMENT_OTHER): Payer: Medicare Other

## 2016-11-27 DIAGNOSIS — D509 Iron deficiency anemia, unspecified: Secondary | ICD-10-CM | POA: Diagnosis present

## 2016-11-27 LAB — ERYTHROPOIETIN: Erythropoietin: 178.9 m[IU]/mL — ABNORMAL HIGH (ref 2.6–18.5)

## 2016-11-27 LAB — VITAMIN B12: Vitamin B12: 653 pg/mL (ref 232–1245)

## 2016-11-27 MED ORDER — DIPHENHYDRAMINE HCL 25 MG PO CAPS
25.0000 mg | ORAL_CAPSULE | Freq: Once | ORAL | Status: AC
  Administered 2016-11-27: 25 mg via ORAL

## 2016-11-27 MED ORDER — ACETAMINOPHEN 325 MG PO TABS
650.0000 mg | ORAL_TABLET | Freq: Once | ORAL | Status: AC
  Administered 2016-11-27: 650 mg via ORAL

## 2016-11-27 MED ORDER — SODIUM CHLORIDE 0.9 % IV SOLN
250.0000 mL | Freq: Once | INTRAVENOUS | Status: AC
  Administered 2016-11-27: 250 mL via INTRAVENOUS

## 2016-11-27 MED ORDER — DIPHENHYDRAMINE HCL 25 MG PO CAPS
ORAL_CAPSULE | ORAL | Status: AC
  Filled 2016-11-27: qty 1

## 2016-11-27 MED ORDER — ACETAMINOPHEN 325 MG PO TABS
ORAL_TABLET | ORAL | Status: AC
  Filled 2016-11-27: qty 2

## 2016-11-27 NOTE — Patient Instructions (Signed)
Blood Transfusion , Adult A blood transfusion is a procedure in which you receive donated blood, including plasma, platelets, and red blood cells, through an IV tube. You may need a blood transfusion because of illness, surgery, or injury. The blood may come from a donor. You may also be able to donate blood for yourself (autologous blood donation) before a surgery if you know that you might require a blood transfusion. The blood given in a transfusion is made up of different types of cells. You may receive:  Red blood cells. These carry oxygen to the cells in the body.  White blood cells. These help you fight infections.  Platelets. These help your blood to clot.  Plasma. This is the liquid part of your blood and it helps with fluid imbalances. If you have hemophilia or another clotting disorder, you may also receive other types of blood products. Tell a health care provider about:  Any allergies you have.  All medicines you are taking, including vitamins, herbs, eye drops, creams, and over-the-counter medicines.  Any problems you or family members have had with anesthetic medicines.  Any blood disorders you have.  Any surgeries you have had.  Any medical conditions you have, including any recent fever or cold symptoms.  Whether you are pregnant or may be pregnant.  Any previous reactions you have had during a blood transfusion. What are the risks? Generally, this is a safe procedure. However, problems may occur, including:  Having an allergic reaction to something in the donated blood. Hives and itching may be symptoms of this type of reaction.  Fever. This may be a reaction to the white blood cells in the transfused blood. Nausea or chest pain may accompany a fever.  Iron overload. This can happen from having many transfusions.  Transfusion-related acute lung injury (TRALI). This is a rare reaction that causes lung damage. The cause is not known.TRALI can occur within hours  of a transfusion or several days later.  Sudden (acute) or delayed hemolytic reactions. This happens if your blood does not match the cells in your transfusion. Your body's defense system (immune system) may try to attack the new cells. This complication is rare. The symptoms include fever, chills, nausea, and low back pain or chest pain.  Infection or disease transmission. This is rare. What happens before the procedure?  You will have a blood test to determine your blood type. This is necessary to know what kind of blood your body will accept and to match it to the donor blood.  If you are going to have a planned surgery, you may be able to do an autologous blood donation. This may be done in case you need to have a transfusion.  If you have had an allergic reaction to a transfusion in the past, you may be given medicine to help prevent a reaction. This medicine may be given to you by mouth or through an IV tube.  You will have your temperature, blood pressure, and pulse monitored before the transfusion.  Follow instructions from your health care provider about eating and drinking restrictions.  Ask your health care provider about:  Changing or stopping your regular medicines. This is especially important if you are taking diabetes medicines or blood thinners.  Taking medicines such as aspirin and ibuprofen. These medicines can thin your blood. Do not take these medicines before your procedure if your health care provider instructs you not to. What happens during the procedure?  An IV tube will be   inserted into one of your veins.  The bag of donated blood will be attached to your IV tube. The blood will then enter through your vein.  Your temperature, blood pressure, and pulse will be monitored regularly during the transfusion. This monitoring is done to detect early signs of a transfusion reaction.  If you have any signs or symptoms of a reaction, your transfusion will be stopped and  you may be given medicine.  When the transfusion is complete, your IV tube will be removed.  Pressure may be applied to the IV site for a few minutes.  A bandage (dressing) will be applied. The procedure may vary among health care providers and hospitals. What happens after the procedure?  Your temperature, blood pressure, heart rate, breathing rate, and blood oxygen level will be monitored often.  Your blood may be tested to see how you are responding to the transfusion.  You may be warmed with fluids or blankets to maintain a normal body temperature. Summary  A blood transfusion is a procedure in which you receive donated blood, including plasma, platelets, and red blood cells, through an IV tube.  Your temperature, blood pressure, and pulse will be monitored before, during, and after the transfusion.  Your blood may be tested after the transfusion to see how your body has responded. This information is not intended to replace advice given to you by your health care provider. Make sure you discuss any questions you have with your health care provider. Document Released: 09/04/2000 Document Revised: 06/04/2016 Document Reviewed: 06/04/2016 Elsevier Interactive Patient Education  2017 [employer]  

## 2016-11-27 NOTE — Telephone Encounter (Signed)
Appointments scheduled per 3/8 LOS / 3/8 Scheduling Message. Patient notified.

## 2016-11-30 ENCOUNTER — Inpatient Hospital Stay (HOSPITAL_COMMUNITY): Admission: RE | Admit: 2016-11-30 | Payer: Medicare Other | Source: Ambulatory Visit

## 2016-11-30 ENCOUNTER — Telehealth: Payer: Self-pay | Admitting: Oncology

## 2016-11-30 LAB — BPAM RBC
BLOOD PRODUCT EXPIRATION DATE: 201804062359
Blood Product Expiration Date: 201804062359
ISSUE DATE / TIME: 201803091038
ISSUE DATE / TIME: 201803091038
UNIT TYPE AND RH: 5100
Unit Type and Rh: 5100

## 2016-11-30 LAB — TYPE AND SCREEN
ABO/RH(D): O POS
ANTIBODY SCREEN: NEGATIVE
Unit division: 0
Unit division: 0

## 2016-11-30 NOTE — Telephone Encounter (Signed)
Pt called to r/s lab/MD appt to 3/13 at 10 am

## 2016-12-01 ENCOUNTER — Ambulatory Visit (HOSPITAL_BASED_OUTPATIENT_CLINIC_OR_DEPARTMENT_OTHER): Payer: Medicare Other | Admitting: Oncology

## 2016-12-01 ENCOUNTER — Other Ambulatory Visit (HOSPITAL_BASED_OUTPATIENT_CLINIC_OR_DEPARTMENT_OTHER): Payer: Medicare Other

## 2016-12-01 ENCOUNTER — Telehealth: Payer: Self-pay | Admitting: Oncology

## 2016-12-01 VITALS — BP 156/49 | HR 85 | Temp 97.7°F | Resp 20 | Wt 249.3 lb

## 2016-12-01 DIAGNOSIS — C61 Malignant neoplasm of prostate: Secondary | ICD-10-CM

## 2016-12-01 DIAGNOSIS — D649 Anemia, unspecified: Secondary | ICD-10-CM

## 2016-12-01 DIAGNOSIS — D6489 Other specified anemias: Secondary | ICD-10-CM

## 2016-12-01 DIAGNOSIS — D538 Other specified nutritional anemias: Secondary | ICD-10-CM

## 2016-12-01 LAB — CBC WITH DIFFERENTIAL/PLATELET
BASO%: 0.5 % (ref 0.0–2.0)
BASOS ABS: 0 10*3/uL (ref 0.0–0.1)
EOS%: 2.9 % (ref 0.0–7.0)
Eosinophils Absolute: 0.2 10*3/uL (ref 0.0–0.5)
HCT: 30.2 % — ABNORMAL LOW (ref 38.4–49.9)
HEMOGLOBIN: 9.7 g/dL — AB (ref 13.0–17.1)
LYMPH%: 3.5 % — ABNORMAL LOW (ref 14.0–49.0)
MCH: 29.1 pg (ref 27.2–33.4)
MCHC: 32.2 g/dL (ref 32.0–36.0)
MCV: 90.3 fL (ref 79.3–98.0)
MONO#: 0.8 10*3/uL (ref 0.1–0.9)
MONO%: 9.9 % (ref 0.0–14.0)
NEUT#: 6.9 10*3/uL — ABNORMAL HIGH (ref 1.5–6.5)
NEUT%: 83.2 % — ABNORMAL HIGH (ref 39.0–75.0)
Platelets: 327 10*3/uL (ref 140–400)
RBC: 3.34 10*6/uL — ABNORMAL LOW (ref 4.20–5.82)
RDW: 16 % — AB (ref 11.0–14.6)
WBC: 8.3 10*3/uL (ref 4.0–10.3)
lymph#: 0.3 10*3/uL — ABNORMAL LOW (ref 0.9–3.3)

## 2016-12-01 LAB — MULTIPLE MYELOMA PANEL, SERUM
ALBUMIN/GLOB SERPL: 1.1 (ref 0.7–1.7)
ALPHA2 GLOB SERPL ELPH-MCNC: 0.9 g/dL (ref 0.4–1.0)
Albumin SerPl Elph-Mcnc: 2.9 g/dL (ref 2.9–4.4)
Alpha 1: 0.4 g/dL (ref 0.0–0.4)
B-GLOBULIN SERPL ELPH-MCNC: 1 g/dL (ref 0.7–1.3)
GAMMA GLOB SERPL ELPH-MCNC: 0.5 g/dL (ref 0.4–1.8)
GLOBULIN, TOTAL: 2.9 g/dL (ref 2.2–3.9)
IGG (IMMUNOGLOBIN G), SERUM: 574 mg/dL — AB (ref 700–1600)
IgA, Qn, Serum: 107 mg/dL (ref 61–437)
IgM, Qn, Serum: 17 mg/dL (ref 15–143)
Total Protein: 5.8 g/dL — ABNORMAL LOW (ref 6.0–8.5)

## 2016-12-01 NOTE — Progress Notes (Signed)
Hematology and Oncology Follow Up Visit  Kevin Kane [id number] November 10, 1929 83 y.o. 12/01/2016 11:05 AM Kevin Kane, DOReed, Kane L, DO   Principle Diagnosis: 83 year old gentleman with the following diagnoses:  1. Prostate cancer diagnosed in September 2017. He had a Gleason score 4+5 = 9 clinical stage T2c. his PSA was 15.9.  2. Multifactorial anemia workup is ongoing diagnosed in March 2018.   Prior Therapy:   He is status post androgen deprivation therapy followed by radiation therapy completed in December 2017.  Current therapy: intermittent transfusions as needed.   Interim History:  Kevin Kane presents today for a follow-up visit. Since the last visit, he completed transfusion of packed red cells without any major complications. He reports his energy and performance status are slightly improved. He denied any chest pain or difficulty breathing. He denied any dyspnea at rest. He does report very little hematochezia when he moves his bowels. He still ambulating with the help of a walker.   He does not report any headaches, blurry vision, syncope or seizures. He does not report any fevers, chills, sweats or weight loss. He isn't report any chest pain, palpitation, orthopnea or leg edema. He does not report any cough, wheezing or hemoptysis. He does not report any nausea, vomiting or abdominal pain. He does not report any frequency urgency or hesitancy. He does not report any hematuria or dysuria. He does not report any skeletal complaints. Remaining review of systems unremarkable.   Medications: I have reviewed the patient's current medications.  Current Outpatient Prescriptions  Medication Sig Dispense Refill  . acyclovir (ZOVIRAX) 400 MG tablet Take 400 mg by mouth every morning. Take one tablet daily for virus in eyes    . albuterol (PROAIR HFA) 108 (90 Base) MCG/ACT inhaler INHALE 2 PUFFS BY MOUTH EVERY 4 HOURS AS NEEDED FOR WHEEZING    . atorvastatin (LIPITOR) 80 MG tablet  Take 80 mg by mouth every morning. Take one daily for cholesterol    . bicalutamide (CASODEX) 50 MG tablet Take 50 mg by mouth daily.     . calcium carbonate (TUMS - DOSED IN MG ELEMENTAL CALCIUM) 500 MG chewable tablet Chew 1 tablet by mouth as needed for indigestion or heartburn.    . calcium citrate (CALCITRATE - DOSED IN MG ELEMENTAL CALCIUM) 950 MG tablet Take 200 mg of elemental calcium by mouth daily.    . iron polysaccharides (NIFEREX) 150 MG capsule Take 1 capsule (150 mg total) by mouth daily. 30 capsule 3  . levothyroxine (SYNTHROID, LEVOTHROID) 25 MCG tablet TAKE 1 TABLET(25 MCG) BY MOUTH DAILY BEFORE BREAKFAST 30 tablet 11  . metoprolol succinate (TOPROL-XL) 50 MG 24 hr tablet Take 50 mg by mouth every morning. Take one tablet daily for blood pressure    . Multiple Vitamins-Minerals (CENTRUM SILVER ADULT 50+) TABS Take 1 tablet by mouth every morning.    . tamsulosin (FLOMAX) 0.4 MG CAPS capsule Take 0.4 mg by mouth daily after breakfast. Reported on 11/14/2015    . aspirin 81 MG tablet Take 81 mg by mouth daily.     No current facility-administered medications for this visit.      Allergies:  Allergies  Allergen Reactions  . Ketoconazole Other (See Comments)    Adverse skin reaction-- legs turned red and purple    Past Medical History, Surgical history, Social history, and Family History were reviewed and updated.  Review of Systems:  Physical Exam: Blood pressure (!) 156/49, pulse 85, temperature 97.7 F (36.5 C), temperature  source Oral, resp. rate 20, weight 249 lb 4.8 oz (113.1 kg), SpO2 93 %. ECOG: 1 General appearance: Well-appearing gentleman appeared without distress.  Head: Normocephalic, without obvious abnormality no oral ulcers or lesions.  Neck: no adenopathy Lymph nodes: Cervical, supraclavicular, and axillary nodes normal. Heart:regular rate and rhythm, S1, S2 normal, no murmur, click, rub or gallop Lung:chest clear, no wheezing, rales, normal symmetric  air entry Abdomin: soft, non-tender, without masses or organomegaly EXT:no erythema, induration, or nodules without edema.   Lab Results: Lab Results  Component Value Date   WBC 8.3 12/01/2016   HGB 9.7 (L) 12/01/2016   HCT 30.2 (L) 12/01/2016   MCV 90.3 12/01/2016   PLT 327 12/01/2016     Chemistry      Component Value Date/Time   NA 146 (H) 11/26/2016 1156   K 4.1 11/26/2016 1156   CL 108 08/27/2016 1633   CO2 27 11/26/2016 1156   BUN 32.3 (H) 11/26/2016 1156   CREATININE 1.8 (H) 11/26/2016 1156   GLU 100 11/19/2016      Component Value Date/Time   CALCIUM 9.7 11/26/2016 1156   ALKPHOS 102 11/26/2016 1156   AST 20 11/26/2016 1156   ALT 19 11/26/2016 1156   BILITOT 0.52 11/26/2016 1156       Impression and Plan:  83 year old gentleman with the following issues:  1. Prostate cancer diagnosed in August 2017. He presented with a PSA of 15.9 and clinical stage TIIc. His prostate biopsy showed a Gleason score 4+5 = 9 with pattern 4+4 = 8 also noted. 11 out of 12 cores were involved with cancer. Staging workup did not show any evidence of metastatic disease and he has very little urinary tract symptoms at this time.  He completed definitive radiation therapy after androgen deprivation therapy in December 2018. His PSA on 11/19/2016 was 0.23.  2. Multifactorial anemia with worsening symptoms in the last few weeks. His hemoglobin today 6.8 with the normal MCV. His creatinine is 1.65.  His workup is ongoing at this time without any clear-cut etiology. His iron studies, B12 and EPO are all appropriate. Given these findings, I have recommended a bone marrow biopsy for further evaluation for possible myelodysplastic syndrome. Risks and benefits of this procedure was discussed today and he is agreeable. He'll have follow-up after the bone marrow biopsy to discuss the results. In the interim we'll continue to monitor his counts closely and transfuse as needed.    3. Follow-up:  Will be in the next few weeks to follow his progress.   [facility], MD 3/13/201811:05 AM

## 2016-12-01 NOTE — Telephone Encounter (Signed)
Gave patient AVS and calender per 12/01/2016 los. 

## 2016-12-03 ENCOUNTER — Encounter: Payer: Self-pay | Admitting: *Deleted

## 2016-12-03 ENCOUNTER — Telehealth: Payer: Self-pay | Admitting: Oncology

## 2016-12-03 NOTE — Telephone Encounter (Signed)
Left message for patient re 4/12 f/u and confirmed 2/26 lab/PRBC's. Schedule mailed.

## 2016-12-03 NOTE — Progress Notes (Signed)
Spoke with patient, cancelled appt with dr Alen Blew on 12/14/16. D/t  BMBX is on 12/21/16. Patient will keep 12/14/16 appt for lab and possible blood transfusion. [place] to schedulers for these changes.

## 2016-12-04 ENCOUNTER — Telehealth: Payer: Self-pay | Admitting: Oncology

## 2016-12-04 NOTE — Telephone Encounter (Signed)
Patient is not able to make to appointment made for 3/12 and would like to move it to 3/9 or following week except Monday   [phone]  home [phone]  cell

## 2016-12-07 ENCOUNTER — Telehealth: Payer: Self-pay | Admitting: *Deleted

## 2016-12-07 ENCOUNTER — Ambulatory Visit: Payer: Medicare Other | Admitting: Oncology

## 2016-12-07 ENCOUNTER — Other Ambulatory Visit: Payer: Medicare Other

## 2016-12-07 NOTE — Telephone Encounter (Signed)
Patient calling [place] say [place] 19th. He is scheduled to see dr Alen Blew for [facility] results on the April 12th. Could we please move his appt?

## 2016-12-08 NOTE — Telephone Encounter (Signed)
I can see him on 4/6 12:00. We need at least 4 days after his Bone Marrow biopsy to get the results.

## 2016-12-11 ENCOUNTER — Other Ambulatory Visit: Payer: Medicare Other

## 2016-12-11 ENCOUNTER — Ambulatory Visit: Payer: Medicare Other | Admitting: Oncology

## 2016-12-14 ENCOUNTER — Other Ambulatory Visit (HOSPITAL_BASED_OUTPATIENT_CLINIC_OR_DEPARTMENT_OTHER): Payer: Medicare Other

## 2016-12-14 ENCOUNTER — Ambulatory Visit: Payer: Medicare Other | Admitting: Oncology

## 2016-12-14 DIAGNOSIS — D538 Other specified nutritional anemias: Secondary | ICD-10-CM

## 2016-12-14 DIAGNOSIS — C61 Malignant neoplasm of prostate: Secondary | ICD-10-CM | POA: Diagnosis present

## 2016-12-14 LAB — CBC WITH DIFFERENTIAL/PLATELET
BASO%: 0.5 % (ref 0.0–2.0)
Basophils Absolute: 0 10*3/uL (ref 0.0–0.1)
EOS ABS: 0.2 10*3/uL (ref 0.0–0.5)
EOS%: 2.6 % (ref 0.0–7.0)
HCT: 29.5 % — ABNORMAL LOW (ref 38.4–49.9)
HEMOGLOBIN: 9.5 g/dL — AB (ref 13.0–17.1)
LYMPH%: 4.4 % — AB (ref 14.0–49.0)
MCH: 28.7 pg (ref 27.2–33.4)
MCHC: 32.3 g/dL (ref 32.0–36.0)
MCV: 88.9 fL (ref 79.3–98.0)
MONO#: 0.9 10*3/uL (ref 0.1–0.9)
MONO%: 10.7 % (ref 0.0–14.0)
NEUT%: 81.8 % — ABNORMAL HIGH (ref 39.0–75.0)
NEUTROS ABS: 6.9 10*3/uL — AB (ref 1.5–6.5)
Platelets: 283 10*3/uL (ref 140–400)
RBC: 3.32 10*6/uL — AB (ref 4.20–5.82)
RDW: 15.4 % — AB (ref 11.0–14.6)
WBC: 8.5 10*3/uL (ref 4.0–10.3)
lymph#: 0.4 10*3/uL — ABNORMAL LOW (ref 0.9–3.3)

## 2016-12-15 ENCOUNTER — Telehealth: Payer: Self-pay | Admitting: Oncology

## 2016-12-15 ENCOUNTER — Ambulatory Visit (HOSPITAL_COMMUNITY): Admission: RE | Admit: 2016-12-15 | Payer: Medicare Other | Source: Ambulatory Visit

## 2016-12-15 ENCOUNTER — Ambulatory Visit (HOSPITAL_COMMUNITY): Payer: Medicare Other

## 2016-12-15 NOTE — Telephone Encounter (Signed)
Called patient to inform him of change in appointment. Rescheduled 4/12 to 4/6. Patient aware of date/time.

## 2016-12-17 ENCOUNTER — Other Ambulatory Visit: Payer: Self-pay | Admitting: Radiology

## 2016-12-21 ENCOUNTER — Other Ambulatory Visit: Payer: Self-pay | Admitting: Oncology

## 2016-12-21 ENCOUNTER — Ambulatory Visit (HOSPITAL_COMMUNITY)
Admission: RE | Admit: 2016-12-21 | Discharge: 2016-12-21 | Disposition: A | Discharge status: Home / Self Care | Payer: Medicare Other | Source: Ambulatory Visit | Attending: Oncology | Admitting: Oncology

## 2016-12-21 ENCOUNTER — Encounter (HOSPITAL_COMMUNITY): Payer: Self-pay

## 2016-12-21 DIAGNOSIS — D538 Other specified nutritional anemias: Secondary | ICD-10-CM

## 2016-12-21 DIAGNOSIS — E039 Hypothyroidism, unspecified: Secondary | ICD-10-CM | POA: Diagnosis not present

## 2016-12-21 DIAGNOSIS — Z87891 Personal history of nicotine dependence: Secondary | ICD-10-CM | POA: Diagnosis not present

## 2016-12-21 DIAGNOSIS — I10 Essential (primary) hypertension: Secondary | ICD-10-CM | POA: Insufficient documentation

## 2016-12-21 DIAGNOSIS — E785 Hyperlipidemia, unspecified: Secondary | ICD-10-CM | POA: Diagnosis not present

## 2016-12-21 DIAGNOSIS — Z7982 Long term (current) use of aspirin: Secondary | ICD-10-CM | POA: Insufficient documentation

## 2016-12-21 DIAGNOSIS — Z8546 Personal history of malignant neoplasm of prostate: Secondary | ICD-10-CM | POA: Insufficient documentation

## 2016-12-21 DIAGNOSIS — M069 Rheumatoid arthritis, unspecified: Secondary | ICD-10-CM | POA: Diagnosis not present

## 2016-12-21 DIAGNOSIS — I2510 Atherosclerotic heart disease of native coronary artery without angina pectoris: Secondary | ICD-10-CM | POA: Diagnosis not present

## 2016-12-21 DIAGNOSIS — I252 Old myocardial infarction: Secondary | ICD-10-CM | POA: Diagnosis not present

## 2016-12-21 DIAGNOSIS — Z87442 Personal history of urinary calculi: Secondary | ICD-10-CM | POA: Diagnosis not present

## 2016-12-21 DIAGNOSIS — I440 Atrioventricular block, first degree: Secondary | ICD-10-CM | POA: Diagnosis not present

## 2016-12-21 DIAGNOSIS — D649 Anemia, unspecified: Secondary | ICD-10-CM | POA: Diagnosis not present

## 2016-12-21 DIAGNOSIS — D638 Anemia in other chronic diseases classified elsewhere: Secondary | ICD-10-CM | POA: Diagnosis not present

## 2016-12-21 DIAGNOSIS — K219 Gastro-esophageal reflux disease without esophagitis: Secondary | ICD-10-CM | POA: Diagnosis not present

## 2016-12-21 DIAGNOSIS — J449 Chronic obstructive pulmonary disease, unspecified: Secondary | ICD-10-CM | POA: Diagnosis not present

## 2016-12-21 DIAGNOSIS — D619 Aplastic anemia, unspecified: Secondary | ICD-10-CM | POA: Diagnosis not present

## 2016-12-21 DIAGNOSIS — Z955 Presence of coronary angioplasty implant and graft: Secondary | ICD-10-CM | POA: Insufficient documentation

## 2016-12-21 LAB — CBC WITH DIFFERENTIAL/PLATELET
BASOS PCT: 0 %
Basophils Absolute: 0 10*3/uL (ref 0.0–0.1)
EOS ABS: 0.3 10*3/uL (ref 0.0–0.7)
Eosinophils Relative: 3 %
HEMATOCRIT: 30.7 % — AB (ref 39.0–52.0)
Hemoglobin: 9.7 g/dL — ABNORMAL LOW (ref 13.0–17.0)
Lymphocytes Relative: 5 %
Lymphs Abs: 0.4 10*3/uL — ABNORMAL LOW (ref 0.7–4.0)
MCH: 28.4 pg (ref 26.0–34.0)
MCHC: 31.6 g/dL (ref 30.0–36.0)
MCV: 89.8 fL (ref 78.0–100.0)
Monocytes Absolute: 1.1 10*3/uL — ABNORMAL HIGH (ref 0.1–1.0)
Monocytes Relative: 12 %
NEUTROS ABS: 7.2 10*3/uL (ref 1.7–7.7)
NEUTROS PCT: 80 %
Platelets: 301 10*3/uL (ref 150–400)
RBC: 3.42 MIL/uL — AB (ref 4.22–5.81)
RDW: 14.5 % (ref 11.5–15.5)
WBC: 9 10*3/uL (ref 4.0–10.5)

## 2016-12-21 LAB — BASIC METABOLIC PANEL
ANION GAP: 8 (ref 5–15)
BUN: 38 mg/dL — ABNORMAL HIGH (ref 6–20)
CALCIUM: 9.4 mg/dL (ref 8.9–10.3)
CO2: 26 mmol/L (ref 22–32)
Chloride: 108 mmol/L (ref 101–111)
Creatinine, Ser: 1.56 mg/dL — ABNORMAL HIGH (ref 0.61–1.24)
GFR, EST AFRICAN AMERICAN: 45 mL/min — AB (ref >60)
GFR, EST NON AFRICAN AMERICAN: 39 mL/min — AB (ref >60)
Glucose, Bld: 109 mg/dL — ABNORMAL HIGH (ref 65–99)
Potassium: 4.2 mmol/L (ref 3.5–5.1)
SODIUM: 142 mmol/L (ref 135–145)

## 2016-12-21 LAB — PROTIME-INR
INR: 1.03
PROTHROMBIN TIME: 13.5 s (ref 11.4–15.2)

## 2016-12-21 MED ORDER — MIDAZOLAM HCL 2 MG/2ML IJ SOLN
INTRAMUSCULAR | Status: AC | PRN
  Administered 2016-12-21: 1 mg via INTRAVENOUS

## 2016-12-21 MED ORDER — HYDROCODONE-ACETAMINOPHEN 5-325 MG PO TABS: 1.0000 | ORAL_TABLET | ORAL | Status: DC | PRN

## 2016-12-21 MED ORDER — MIDAZOLAM HCL 2 MG/2ML IJ SOLN
INTRAMUSCULAR | Status: AC
Start: 2016-12-21 — End: 2016-12-21
  Filled 2016-12-21: qty 4

## 2016-12-21 MED ORDER — FENTANYL CITRATE (PF) 100 MCG/2ML IJ SOLN
INTRAMUSCULAR | Status: AC
  Filled 2016-12-21: qty 4

## 2016-12-21 MED ORDER — FENTANYL CITRATE (PF) 100 MCG/2ML IJ SOLN
INTRAMUSCULAR | Status: AC | PRN
  Administered 2016-12-21: 50 ug via INTRAVENOUS

## 2016-12-21 MED ORDER — SODIUM CHLORIDE 0.9 % IV SOLN
INTRAVENOUS | Status: DC
  Administered 2016-12-21: 10:00:00 via INTRAVENOUS

## 2016-12-21 NOTE — Sedation Documentation (Signed)
dsg to low back intact

## 2016-12-21 NOTE — Sedation Documentation (Signed)
Patient is resting comfortably. 

## 2016-12-21 NOTE — Discharge Instructions (Signed)
Bone Marrow Aspiration and Bone Marrow Biopsy, Adult, Care After °This sheet gives you information about how to care for yourself after your procedure. Your health care provider may also give you more specific instructions. If you have problems or questions, contact your health care provider. °What can I expect after the procedure? °After the procedure, it is common to have: °· Mild pain and tenderness. °· Swelling. °· Bruising. °Follow these instructions at home: °· Take over-the-counter or prescription medicines only as told by your health care provider. °· Do not take baths, swim, or use a hot tub until your health care provider approves. Ask if you can take a shower or have a sponge bath. °· Follow instructions from your health care provider about how to take care of the puncture site. Make sure you: °¨ Wash your hands with soap and water before you change your bandage (dressing). If soap and water are not available, use hand sanitizer. °¨ Change your dressing as told by your health care provider. °· Check your puncture site every day for signs of infection. Check for: °¨ More redness, swelling, or pain. °¨ More fluid or blood. °¨ Warmth. °¨ Pus or a bad smell. °· Return to your normal activities as told by your health care provider. Ask your health care provider what activities are safe for you. °· Do not drive for 24 hours if you were given a medicine to help you relax (sedative). °· Keep all follow-up visits as told by your health care provider. This is important. °Contact a health care provider if: °· You have more redness, swelling, or pain around the puncture site. °· You have more fluid or blood coming from the puncture site. °· Your puncture site feels warm to the touch. °· You have pus or a bad smell coming from the puncture site. °· You have a fever. °· Your pain is not controlled with medicine. °This information is not intended to replace advice given to you by your health care provider. Make sure you  discuss any questions you have with your health care provider. °Document Released: 03/27/2005 Document Revised: 03/27/2016 Document Reviewed: 02/19/2016 °Elsevier Interactive Patient Education © 2017 [employer] °Moderate Conscious Sedation, Adult, Care After °These instructions provide you with information about caring for yourself after your procedure. Your health care provider may also give you more specific instructions. Your treatment has been planned according to current medical practices, but problems sometimes occur. Call your health care provider if you have any problems or questions after your procedure. °What can I expect after the procedure? °After your procedure, it is common: °· To feel sleepy for several hours. °· To feel clumsy and have poor balance for several hours. °· To have poor judgment for several hours. °· To vomit if you eat too soon. °Follow these instructions at home: °For at least 24 hours after the procedure:  ° °· Do not: °¨ Participate in activities where you could fall or become injured. °¨ Drive. °¨ Use heavy machinery. °¨ Drink alcohol. °¨ Take sleeping pills or medicines that cause drowsiness. °¨ Make important decisions or sign legal documents. °¨ Take care of children on your own. °· Rest. °Eating and drinking  °· Follow the diet recommended by your health care provider. °· If you vomit: °¨ Drink water, juice, or soup when you can drink without vomiting. °¨ Make sure you have little or no nausea before eating solid foods. °General instructions  °· Have a responsible adult stay with you until you   are awake and alert. °· Take over-the-counter and prescription medicines only as told by your health care provider. °· If you smoke, do not smoke without supervision. °· Keep all follow-up visits as told by your health care provider. This is important. °Contact a health care provider if: °· You keep feeling nauseous or you keep vomiting. °· You feel light-headed. °· You develop a  rash. °· You have a fever. °Get help right away if: °· You have trouble breathing. °This information is not intended to replace advice given to you by your health care provider. Make sure you discuss any questions you have with your health care provider. °Document Released: 06/28/2013 Document Revised: 02/10/2016 Document Reviewed: 12/28/2015 °Elsevier Interactive Patient Education © 2017 Elsevier Inc. ° °

## 2016-12-21 NOTE — H&P (Signed)
Chief Complaint: Patient was seen in consultation today for bone marrow biopsy  Referring Physician(s):  Wyatt Portela  Supervising Physician: Markus Daft  Patient Status: [facility] - Out-pt  History of Present Illness: Kevin Kane is a 83 y.o. male with past medical history of COPD, CAD, first degree heart block, GERD, HLD, HTN, prostate cancer diagnosed in 05/2016 who presents with multifactorial anemia for which he has received blood transfusions in the past.  IR consulted for bone marrow biopsy at the request of Dr. Zola Button.   Patient has been NPO.  He does not take blood thinners.  He has been in his usual state of health.   Past Medical History:  Diagnosis Date  . At risk for sleep apnea    STOP-BANG=5      SENT TO PCP 09-25-2014  . Bilateral lower extremity edema   . COPD mixed type [facility]) pulmologist-  dr Reginia Naas [facility])  note in epic under Care Everywhere tab)   per PFTs: 10/10/2013: Evidence of moderate obstructive lung disease with small airway involvement (asthma); FEV1 54% predicted, FEV1/FVC <70%, FEF25-75% is reduced. Mild response to bronchodilator; FEV1 w/ 8% change after bronchodilator.    -- and  mild intermittant asthma  . Coronary atherosclerosis of native coronary artery    cardiologist-  dr schwengel  in [place], [place] (where pt lives during spring and summer)  . Eczema   . Elevated prostate specific antigen (PSA) 10/10/2006  . First degree heart block   . Frequency of urination   . GERD (gastroesophageal reflux disease)   . Herpes simplex of eye    bilateral eye-  takes acyclovir daily  . History of kidney stones   . History of MI (myocardial infarction)    2001--  s/p  PCI and stent  . Hyperlipidemia   . Hypertension   . Hypothyroidism   . Mild intermittent asthma   . Nephrolithiasis    right  . OA (osteoarthritis)   . Prostate cancer ([place])   . Rheumatoid arthritis ([place]) 05/2013  . Right ureteral stone   . Urgency of urination   .  Wears glasses   . Wears hearing aid    bilateral    Past Surgical History:  Procedure Laterality Date  . CATARACT EXTRACTION W/ INTRAOCULAR LENS  IMPLANT, BILATERAL Bilateral 2014  . COLONOSCOPY  2010  . CORONARY ANGIOPLASTY WITH STENT PLACEMENT  2001   ([address], [place])   PCI and stenting  . CYSTOSCOPY WITH RETROGRADE PYELOGRAM, URETEROSCOPY AND STENT PLACEMENT Right 09/30/2015   Procedure: RIGHT URETEROSCOPY, RETROGRADE PYELOGRAM, LASER LITHOTRIPSY AND STENT PLACEMENT;  Surgeon: Kathie Rhodes, MD;  Location: [facility];  Service: Urology;  Laterality: Right;  . CYSTOSCOPY/URETEROSCOPY/HOLMIUM LASER/STENT PLACEMENT Right 11/04/2015   Procedure: RIGHT URETEROSCOPY/RETROGRADE PYELOGRAM/HOLMIUM LASER LITHOTRIPSY/STENT PLACEMENT;  Surgeon: Kathie Rhodes, MD;  Location: [facility];  Service: Urology;  Laterality: Right;  . PILONIDAL CYST EXCISION  2006  . TONSILLECTOMY  as child  . TOTAL HIP ARTHROPLASTY Left 2001    Allergies: Ketoconazole  Medications: Prior to Admission medications   Medication Sig Start Date End Date Taking? Authorizing Provider  acyclovir (ZOVIRAX) 400 MG tablet Take 400 mg by mouth every morning. Take one tablet daily for virus in eyes   Yes Historical Provider, MD  albuterol (PROAIR HFA) 108 (90 Base) MCG/ACT inhaler INHALE 2 PUFFS BY MOUTH EVERY 4 HOURS AS NEEDED FOR WHEEZING 08/28/16  Yes Historical Provider, MD  aspirin 81 MG tablet  Take 81 mg by mouth daily.   Yes Historical Provider, MD  atorvastatin (LIPITOR) 80 MG tablet Take 80 mg by mouth every morning. Take one daily for cholesterol   Yes Historical Provider, MD  bicalutamide (CASODEX) 50 MG tablet Take 50 mg by mouth daily.  06/01/16  Yes Historical Provider, MD  calcium carbonate (TUMS - DOSED IN MG ELEMENTAL CALCIUM) 500 MG chewable tablet Chew 1 tablet by mouth as needed for indigestion or heartburn.   Yes Historical Provider, MD  calcium citrate (CALCITRATE - DOSED IN MG  ELEMENTAL CALCIUM) 950 MG tablet Take 200 mg of elemental calcium by mouth daily.   Yes Historical Provider, MD  iron polysaccharides (NIFEREX) 150 MG capsule Take 1 capsule (150 mg total) by mouth daily. 08/31/16  Yes [name] [name], DO  levothyroxine (SYNTHROID, LEVOTHROID) 25 MCG tablet TAKE 1 TABLET(25 MCG) BY MOUTH DAILY BEFORE BREAKFAST 10/12/16  Yes [name] [name], DO  metoprolol succinate (TOPROL-XL) 50 MG 24 hr tablet Take 50 mg by mouth every morning. Take one tablet daily for blood pressure   Yes Historical Provider, MD  Multiple Vitamins-Minerals (CENTRUM SILVER ADULT 50+) TABS Take 1 tablet by mouth every morning.   Yes Historical Provider, MD  tamsulosin (FLOMAX) 0.4 MG CAPS capsule Take 0.4 mg by mouth daily after breakfast. Reported on 11/14/2015   Yes Historical Provider, MD     Family History  Problem Relation Age of Onset  . Stroke Mother   . Cancer Neg Hx     Social History   Social History  . Marital status: Married    Spouse name: N/A  . Number of children: N/A  . Years of education: N/A   Occupational History  . retired Programme researcher, broadcasting/film/video     Social History Main Topics  . Smoking status: Former Smoker    Years: 15.00    Types: Cigarettes    Quit date: 09/21/1962  . Smokeless tobacco: Never Used  . Alcohol use 3.0 oz/week    5 Glasses of wine per week  . Drug use: No  . Sexual activity: Not Currently   Other Topics Concern  . None   Social History Narrative   Patient is Married since 1955. Retired Programme researcher, broadcasting/film/video. Lives in single level home, Independent Living section at [facility] since 2013.  Spends half the year at home in [place].    Stopped smoking 1964, Moderate alcohol intake, 5 glasses wine/ week    Minimal exercise, walking. Has pet cat.   Patient has no Advanced planning documents             Review of Systems  Constitutional: Negative for fatigue and fever.  Respiratory: Positive for cough (chronic). Negative for shortness of  breath.   Cardiovascular: Negative for chest pain.  Psychiatric/Behavioral: Negative for behavioral problems and confusion.    Vital Signs: BP (!) 132/45 (BP Location: Right Arm)   Pulse 64   Temp 97.5 F (36.4 C) (Oral)   Resp 18   Ht 5' 7"  (1.702 m)   Wt 249 lb 4.8 oz (113.1 kg)   SpO2 97%   BMI 39.05 kg/m   Physical Exam  Constitutional: He is oriented to person, place, and time. He appears well-developed.  Cardiovascular: Normal rate, regular rhythm and normal heart sounds.   Pulmonary/Chest: Effort normal and breath sounds normal. No respiratory distress.  Neurological: He is alert and oriented to person, place, and time.  Skin: Skin is warm and dry.  Psychiatric: He has  a normal mood and affect. His behavior is normal. Judgment and thought content normal.  Nursing note and vitals reviewed.   Mallampati Score:  MD Evaluation Airway: WNL Heart: WNL Abdomen: WNL Chest/ Lungs: WNL ASA  Classification: 3 Mallampati/Airway Score: Three  Imaging: No results found.  Labs:  CBC:  Recent Labs  11/26/16 1156 12/01/16 1023 12/14/16 0915 12/21/16 0931  WBC 9.9 8.3 8.5 9.0  HGB 6.8* 9.7* 9.5* 9.7*  HCT 21.9* 30.2* 29.5* 30.7*  PLT 355 327 283 301    COAGS: No results for input(s): INR, APTT in the last 8760 hours.  BMP:  Recent Labs  08/27/16 1633 11/19/16 11/26/16 1156 12/21/16 0931  NA 143 144 146* 142  K 4.4 4.8 4.1 4.2  CL 108  --   --  108  CO2 27  --  27 26  GLUCOSE 106*  --  97 109*  BUN 28* 33* 32.3* 38*  CALCIUM 9.1  --  9.7 9.4  CREATININE 1.65* 1.4* 1.8* 1.56*  GFRNONAA 38*  --   --  39*  GFRAA 43*  --   --  45*    LIVER FUNCTION TESTS:  Recent Labs  08/27/16 1633 11/19/16 11/26/16 1156 11/26/16 1156  BILITOT 0.6  --  0.52  --   AST 20 20 20   --   ALT 21 21 19   --   ALKPHOS 86 106 102  --   PROT 5.8*  --  6.7 5.8*  ALBUMIN 3.7  --  3.2*  --     TUMOR MARKERS: No results for input(s): AFPTM, CEA, CA199, CHROMGRNA in the  last 8760 hours.  Assessment and Plan: Kevin Kane is a 83 y.o. male with past medical history of COPD, CAD, first degree heart block, GERD, HLD, HTN, prostate cancer diagnosed in 05/2016 who presents with multifactorial anemia. IR consulted for bone marrow biopsy at the request of Dr. Alen Blew. Patient presents for procedure today.  He has been NPO.  He does not take blood thinners.  He has been in his usual state of health.  Risks and benefits discussed with the patient including, but not limited to bleeding, infection, damage to adjacent structures or low yield requiring additional tests. All of the patient's questions were answered, patient is agreeable to proceed. Consent signed and in chart.  Thank you for this interesting consult.  I greatly enjoyed meeting Kevin Kane and look forward to participating in their care.  A copy of this report was sent to the requesting provider on this date.  Electronically Signed: Docia Barrier 12/21/2016, 10:16 AM   I spent a total of  30 Minutes   in face to face in clinical consultation, greater than 50% of which was counseling/coordinating care for anemia.

## 2016-12-21 NOTE — Procedures (Signed)
CT guided bone marrow biopsy.  2 aspirates and 2 cores obtained. Minimal blood loss and no immediate complication.   

## 2016-12-22 DIAGNOSIS — C61 Malignant neoplasm of prostate: Secondary | ICD-10-CM | POA: Diagnosis not present

## 2016-12-22 DIAGNOSIS — N202 Calculus of kidney with calculus of ureter: Secondary | ICD-10-CM | POA: Diagnosis not present

## 2016-12-23 ENCOUNTER — Other Ambulatory Visit: Payer: Self-pay | Admitting: Internal Medicine

## 2016-12-25 ENCOUNTER — Ambulatory Visit (HOSPITAL_BASED_OUTPATIENT_CLINIC_OR_DEPARTMENT_OTHER): Payer: Medicare Other | Admitting: Oncology

## 2016-12-25 VITALS — BP 154/51 | HR 76 | Temp 97.5°F | Resp 18 | Ht 67.0 in | Wt 248.7 lb

## 2016-12-25 DIAGNOSIS — D649 Anemia, unspecified: Secondary | ICD-10-CM | POA: Diagnosis not present

## 2016-12-25 DIAGNOSIS — C61 Malignant neoplasm of prostate: Secondary | ICD-10-CM

## 2016-12-25 DIAGNOSIS — N289 Disorder of kidney and ureter, unspecified: Secondary | ICD-10-CM

## 2016-12-25 NOTE — Progress Notes (Signed)
Hematology and Oncology Follow Up Visit  Kevin Kane [id number] 1930-08-07 83 y.o. 12/25/2016 12:30 PM Kevin Kane, DOReed, Kane L, DO   Principle Diagnosis: 84 year old gentleman with the following diagnoses:  1. Prostate cancer diagnosed in September 2017. He had a Gleason score 4+5 = 9 clinical stage T2c. his PSA was 15.9.  2. Multifactorial anemia workup is ongoing diagnosed in March 2018.   Prior Therapy:   He is status post androgen deprivation therapy followed by radiation therapy completed in December 2017.  Current therapy: intermittent transfusions as needed.   Interim History:  [name] presents today for a follow-up visit. Since the last visit, he underwent a bone marrow biopsy which was uncomplicated at that time. He tolerated the procedure well without any issues. He reports his energy and performance status are slightly improved and close to his baseline. He denied any chest pain or difficulty breathing. He denied any dyspnea at rest. He does report very little hematochezia when he moves his bowels. He still ambulating with the help of a walker. He is planning to travel back to [place] where he will be there for the next 6 months.  He does not report any headaches, blurry vision, syncope or seizures. He does not report any fevers, chills, sweats or weight loss. He isn't report any chest pain, palpitation, orthopnea or leg edema. He does not report any cough, wheezing or hemoptysis. He does not report any nausea, vomiting or abdominal pain. He does not report any frequency urgency or hesitancy. He does not report any hematuria or dysuria. He does not report any skeletal complaints. Remaining review of systems unremarkable.   Medications: I have reviewed the patient's current medications.  Current Outpatient Prescriptions  Medication Sig Dispense Refill  . acyclovir (ZOVIRAX) 400 MG tablet Take 400 mg by mouth every morning. Take one tablet daily for virus in eyes     . albuterol (PROAIR HFA) 108 (90 Base) MCG/ACT inhaler INHALE 2 PUFFS BY MOUTH EVERY 4 HOURS AS NEEDED FOR WHEEZING    . aspirin 81 MG tablet Take 81 mg by mouth daily.    Marland Kitchen atorvastatin (LIPITOR) 80 MG tablet Take 80 mg by mouth every morning. Take one daily for cholesterol    . bicalutamide (CASODEX) 50 MG tablet Take 50 mg by mouth daily.     . calcium carbonate (TUMS - DOSED IN MG ELEMENTAL CALCIUM) 500 MG chewable tablet Chew 1 tablet by mouth as needed for indigestion or heartburn.    . calcium citrate (CALCITRATE - DOSED IN MG ELEMENTAL CALCIUM) 950 MG tablet Take 200 mg of elemental calcium by mouth daily.    Marland Kitchen levothyroxine (SYNTHROID, LEVOTHROID) 25 MCG tablet TAKE 1 TABLET(25 MCG) BY MOUTH DAILY BEFORE BREAKFAST 30 tablet 11  . metoprolol succinate (TOPROL-XL) 50 MG 24 hr tablet Take 50 mg by mouth every morning. Take one tablet daily for blood pressure    . Multiple Vitamins-Minerals (CENTRUM SILVER ADULT 50+) TABS Take 1 tablet by mouth every morning.    Marland Kitchen POLY-IRON 150 150 MG capsule TAKE 1 CAPSULE(150 MG) BY MOUTH DAILY 30 capsule 0  . tamsulosin (FLOMAX) 0.4 MG CAPS capsule Take 0.4 mg by mouth daily after breakfast. Reported on 11/14/2015     No current facility-administered medications for this visit.      Allergies:  Allergies  Allergen Reactions  . Ketoconazole Other (See Comments)    Adverse skin reaction-- legs turned red and purple    Past Medical History, Surgical  history, Social history, and Family History were reviewed and updated.  Review of Systems:  Physical Exam: Blood pressure (!) 154/51, pulse 76, temperature 97.5 F (36.4 C), temperature source Oral, resp. rate 18, height 5' 7"  (1.702 m), weight 248 lb 11.2 oz (112.8 kg), SpO2 96 %. ECOG: 1 General appearance: Alert, awake gentleman without distress. Head: Normocephalic, without obvious abnormality no oral thrush or ulcers. Neck: no adenopathy or neck masses. Lymph nodes: Cervical, supraclavicular,  and axillary nodes normal. Heart:regular rate and rhythm, S1, S2 normal, no murmur, click, rub or gallop Lung:chest clear, no wheezing, rales, normal symmetric air entry Abdomin: soft, non-tender, without masses or organomegaly no shifting dullness or ascites. EXT:no erythema, induration, or nodules without edema.   Lab Results: Lab Results  Component Value Date   WBC 9.0 12/21/2016   HGB 9.7 (Kane) 12/21/2016   HCT 30.7 (Kane) 12/21/2016   MCV 89.8 12/21/2016   PLT 301 12/21/2016     Chemistry      Component Value Date/Time   NA 142 12/21/2016 0931   NA 146 (H) 11/26/2016 1156   K 4.2 12/21/2016 0931   K 4.1 11/26/2016 1156   CL 108 12/21/2016 0931   CO2 26 12/21/2016 0931   CO2 27 11/26/2016 1156   BUN 38 (H) 12/21/2016 0931   BUN 32.3 (H) 11/26/2016 1156   CREATININE 1.56 (H) 12/21/2016 0931   CREATININE 1.8 (H) 11/26/2016 1156   GLU 100 11/19/2016      Component Value Date/Time   CALCIUM 9.4 12/21/2016 0931   CALCIUM 9.7 11/26/2016 1156   ALKPHOS 102 11/26/2016 1156   AST 20 11/26/2016 1156   ALT 19 11/26/2016 1156   BILITOT 0.52 11/26/2016 1156       Impression and Plan:  83 year old gentleman with the following issues:  1. Prostate cancer diagnosed in August 2017. He presented with a PSA of 15.9 and clinical stage TIIc. His prostate biopsy showed a Gleason score 4+5 = 9 with pattern 4+4 = 8 also noted. 11 out of 12 cores were involved with cancer. Staging workup did not show any evidence of metastatic disease and he has very little urinary tract symptoms at this time.  He completed definitive radiation therapy after androgen deprivation therapy in December 2018. His PSA on 11/19/2016 was 0.23.  2. Multifactorial anemia with worsening symptoms. His hemoglobin was 6.8 with the normal MCV. His creatinine is 1.65 11/26/2016. He is status post 2 units of packed red cell transfusion at that time with hemoglobin correcting to 9.7.  His workup has been completed and his  no clear-cut pathology noted. His iron studies, B12, serum protein electrophoresis all within normal range. His bone marrow biopsy obtained on 12/21/2016 showed no clear-cut myelodysplastic syndrome, infiltrative bone marrow process or any other pathology.  The etiology of his anemia is likely related to renal insufficiency as well as recent radiation therapy. Blood loss from kidney stone in February 2017 could also have contributed at this time, his hemoglobin close to his baseline and I have recommended no further transfusions. If his hemoglobin start to drift again, we will repeat packed red cell transfusion and consideration for growth factor support in the form of Aranesp would be a possibility.  He is planning to return to [place] for the next 6 months and he will return for a visit here in October 2018. In the interim I urged him to establish care with a primary care physician and a repeat CBC in the near future.  Referral for hematology might be indicated at that time if his hemoglobin starts to drop again.    3. Follow-up: Will be in 6 months after his return from [place].   Zola Button, MD 4/6/201812:30 PM

## 2016-12-26 ENCOUNTER — Telehealth: Payer: Self-pay | Admitting: Hematology and Oncology

## 2016-12-26 NOTE — Telephone Encounter (Signed)
Appointments scheduled per 4.6.18 LOS. Patient notified.

## 2016-12-30 LAB — CHROMOSOME ANALYSIS, BONE MARROW

## 2016-12-31 ENCOUNTER — Ambulatory Visit: Payer: Medicare Other | Admitting: Oncology

## 2017-01-01 ENCOUNTER — Encounter (HOSPITAL_COMMUNITY): Payer: Self-pay

## 2017-01-21 DIAGNOSIS — M2141 Flat foot [pes planus] (acquired), right foot: Secondary | ICD-10-CM | POA: Diagnosis not present

## 2017-01-21 DIAGNOSIS — M7661 Achilles tendinitis, right leg: Secondary | ICD-10-CM | POA: Diagnosis not present

## 2017-01-21 DIAGNOSIS — M25552 Pain in left hip: Secondary | ICD-10-CM | POA: Diagnosis not present

## 2017-01-21 DIAGNOSIS — M79671 Pain in right foot: Secondary | ICD-10-CM | POA: Diagnosis not present

## 2017-01-21 DIAGNOSIS — Z96642 Presence of left artificial hip joint: Secondary | ICD-10-CM | POA: Diagnosis not present

## 2017-01-21 DIAGNOSIS — M19071 Primary osteoarthritis, right ankle and foot: Secondary | ICD-10-CM | POA: Diagnosis not present

## 2017-01-21 DIAGNOSIS — M7731 Calcaneal spur, right foot: Secondary | ICD-10-CM | POA: Diagnosis not present

## 2017-01-21 DIAGNOSIS — M7989 Other specified soft tissue disorders: Secondary | ICD-10-CM | POA: Diagnosis not present

## 2017-01-24 ENCOUNTER — Other Ambulatory Visit: Payer: Self-pay | Admitting: Internal Medicine

## 2017-01-26 DIAGNOSIS — H35363 Drusen (degenerative) of macula, bilateral: Secondary | ICD-10-CM | POA: Diagnosis not present

## 2017-01-26 DIAGNOSIS — H4912 Fourth [trochlear] nerve palsy, left eye: Secondary | ICD-10-CM | POA: Diagnosis not present

## 2017-01-27 DIAGNOSIS — D692 Other nonthrombocytopenic purpura: Secondary | ICD-10-CM | POA: Diagnosis not present

## 2017-01-27 DIAGNOSIS — I8311 Varicose veins of right lower extremity with inflammation: Secondary | ICD-10-CM | POA: Diagnosis not present

## 2017-01-27 DIAGNOSIS — C44222 Squamous cell carcinoma of skin of right ear and external auricular canal: Secondary | ICD-10-CM | POA: Diagnosis not present

## 2017-01-27 DIAGNOSIS — E039 Hypothyroidism, unspecified: Secondary | ICD-10-CM | POA: Diagnosis not present

## 2017-01-27 DIAGNOSIS — I10 Essential (primary) hypertension: Secondary | ICD-10-CM | POA: Diagnosis not present

## 2017-01-27 DIAGNOSIS — D485 Neoplasm of uncertain behavior of skin: Secondary | ICD-10-CM | POA: Diagnosis not present

## 2017-01-27 DIAGNOSIS — B354 Tinea corporis: Secondary | ICD-10-CM | POA: Diagnosis not present

## 2017-01-27 DIAGNOSIS — Z08 Encounter for follow-up examination after completed treatment for malignant neoplasm: Secondary | ICD-10-CM | POA: Diagnosis not present

## 2017-01-27 DIAGNOSIS — I8312 Varicose veins of left lower extremity with inflammation: Secondary | ICD-10-CM | POA: Diagnosis not present

## 2017-01-27 DIAGNOSIS — Z85828 Personal history of other malignant neoplasm of skin: Secondary | ICD-10-CM | POA: Diagnosis not present

## 2017-01-27 DIAGNOSIS — L570 Actinic keratosis: Secondary | ICD-10-CM | POA: Diagnosis not present

## 2017-01-27 DIAGNOSIS — L820 Inflamed seborrheic keratosis: Secondary | ICD-10-CM | POA: Diagnosis not present

## 2017-01-27 DIAGNOSIS — M721 Knuckle pads: Secondary | ICD-10-CM | POA: Diagnosis not present

## 2017-01-27 DIAGNOSIS — R269 Unspecified abnormalities of gait and mobility: Secondary | ICD-10-CM | POA: Diagnosis not present

## 2017-01-27 DIAGNOSIS — X32XXXA Exposure to sunlight, initial encounter: Secondary | ICD-10-CM | POA: Diagnosis not present

## 2017-01-27 DIAGNOSIS — D649 Anemia, unspecified: Secondary | ICD-10-CM | POA: Diagnosis not present

## 2017-01-27 DIAGNOSIS — R5383 Other fatigue: Secondary | ICD-10-CM | POA: Diagnosis not present

## 2017-01-27 DIAGNOSIS — R4189 Other symptoms and signs involving cognitive functions and awareness: Secondary | ICD-10-CM | POA: Diagnosis not present

## 2017-01-29 DIAGNOSIS — M79661 Pain in right lower leg: Secondary | ICD-10-CM | POA: Diagnosis not present

## 2017-01-29 DIAGNOSIS — M7661 Achilles tendinitis, right leg: Secondary | ICD-10-CM | POA: Diagnosis not present

## 2017-01-29 DIAGNOSIS — M6281 Muscle weakness (generalized): Secondary | ICD-10-CM | POA: Diagnosis not present

## 2017-01-29 DIAGNOSIS — R2689 Other abnormalities of gait and mobility: Secondary | ICD-10-CM | POA: Diagnosis not present

## 2017-02-01 DIAGNOSIS — Z923 Personal history of irradiation: Secondary | ICD-10-CM | POA: Diagnosis not present

## 2017-02-01 DIAGNOSIS — R350 Frequency of micturition: Secondary | ICD-10-CM | POA: Diagnosis not present

## 2017-02-01 DIAGNOSIS — N401 Enlarged prostate with lower urinary tract symptoms: Secondary | ICD-10-CM | POA: Diagnosis not present

## 2017-02-01 DIAGNOSIS — C61 Malignant neoplasm of prostate: Secondary | ICD-10-CM | POA: Diagnosis not present

## 2017-02-01 DIAGNOSIS — R972 Elevated prostate specific antigen [PSA]: Secondary | ICD-10-CM | POA: Diagnosis not present

## 2017-02-01 DIAGNOSIS — N183 Chronic kidney disease, stage 3 (moderate): Secondary | ICD-10-CM | POA: Diagnosis not present

## 2017-02-01 DIAGNOSIS — D631 Anemia in chronic kidney disease: Secondary | ICD-10-CM | POA: Diagnosis not present

## 2017-02-01 DIAGNOSIS — Z87891 Personal history of nicotine dependence: Secondary | ICD-10-CM | POA: Diagnosis not present

## 2017-02-10 DIAGNOSIS — C44329 Squamous cell carcinoma of skin of other parts of face: Secondary | ICD-10-CM | POA: Diagnosis not present

## 2017-02-17 DIAGNOSIS — C61 Malignant neoplasm of prostate: Secondary | ICD-10-CM | POA: Diagnosis not present

## 2017-02-18 DIAGNOSIS — Z4802 Encounter for removal of sutures: Secondary | ICD-10-CM | POA: Diagnosis not present

## 2017-03-08 DIAGNOSIS — Z923 Personal history of irradiation: Secondary | ICD-10-CM | POA: Diagnosis not present

## 2017-03-08 DIAGNOSIS — N183 Chronic kidney disease, stage 3 (moderate): Secondary | ICD-10-CM | POA: Diagnosis not present

## 2017-03-08 DIAGNOSIS — D631 Anemia in chronic kidney disease: Secondary | ICD-10-CM | POA: Diagnosis not present

## 2017-03-08 DIAGNOSIS — D649 Anemia, unspecified: Secondary | ICD-10-CM | POA: Diagnosis not present

## 2017-03-08 DIAGNOSIS — Z8546 Personal history of malignant neoplasm of prostate: Secondary | ICD-10-CM | POA: Diagnosis not present

## 2017-03-30 DIAGNOSIS — M629 Disorder of muscle, unspecified: Secondary | ICD-10-CM | POA: Diagnosis not present

## 2017-03-30 DIAGNOSIS — F0280 Dementia in other diseases classified elsewhere without behavioral disturbance: Secondary | ICD-10-CM | POA: Diagnosis not present

## 2017-03-30 DIAGNOSIS — R41844 Frontal lobe and executive function deficit: Secondary | ICD-10-CM | POA: Diagnosis not present

## 2017-03-30 DIAGNOSIS — F411 Generalized anxiety disorder: Secondary | ICD-10-CM | POA: Diagnosis not present

## 2017-04-06 DIAGNOSIS — D692 Other nonthrombocytopenic purpura: Secondary | ICD-10-CM | POA: Diagnosis not present

## 2017-04-06 DIAGNOSIS — C44329 Squamous cell carcinoma of skin of other parts of face: Secondary | ICD-10-CM | POA: Diagnosis not present

## 2017-04-06 DIAGNOSIS — D485 Neoplasm of uncertain behavior of skin: Secondary | ICD-10-CM | POA: Diagnosis not present

## 2017-04-09 DIAGNOSIS — F4010 Social phobia, unspecified: Secondary | ICD-10-CM | POA: Diagnosis not present

## 2017-04-09 DIAGNOSIS — M629 Disorder of muscle, unspecified: Secondary | ICD-10-CM | POA: Diagnosis not present

## 2017-04-10 DIAGNOSIS — R41844 Frontal lobe and executive function deficit: Secondary | ICD-10-CM | POA: Diagnosis not present

## 2017-04-10 DIAGNOSIS — F0280 Dementia in other diseases classified elsewhere without behavioral disturbance: Secondary | ICD-10-CM | POA: Diagnosis not present

## 2017-04-10 DIAGNOSIS — M629 Disorder of muscle, unspecified: Secondary | ICD-10-CM | POA: Diagnosis not present

## 2017-04-10 DIAGNOSIS — F411 Generalized anxiety disorder: Secondary | ICD-10-CM | POA: Diagnosis not present

## 2017-04-12 DIAGNOSIS — F0280 Dementia in other diseases classified elsewhere without behavioral disturbance: Secondary | ICD-10-CM | POA: Diagnosis not present

## 2017-04-12 DIAGNOSIS — R41844 Frontal lobe and executive function deficit: Secondary | ICD-10-CM | POA: Diagnosis not present

## 2017-04-12 DIAGNOSIS — F411 Generalized anxiety disorder: Secondary | ICD-10-CM | POA: Diagnosis not present

## 2017-04-12 DIAGNOSIS — M629 Disorder of muscle, unspecified: Secondary | ICD-10-CM | POA: Diagnosis not present

## 2017-04-14 ENCOUNTER — Other Ambulatory Visit: Payer: Self-pay | Admitting: Internal Medicine

## 2017-04-27 DIAGNOSIS — M629 Disorder of muscle, unspecified: Secondary | ICD-10-CM | POA: Diagnosis not present

## 2017-04-27 DIAGNOSIS — F4010 Social phobia, unspecified: Secondary | ICD-10-CM | POA: Diagnosis not present

## 2017-05-12 DIAGNOSIS — I2510 Atherosclerotic heart disease of native coronary artery without angina pectoris: Secondary | ICD-10-CM | POA: Diagnosis not present

## 2017-05-12 DIAGNOSIS — N289 Disorder of kidney and ureter, unspecified: Secondary | ICD-10-CM | POA: Diagnosis not present

## 2017-05-12 DIAGNOSIS — Z955 Presence of coronary angioplasty implant and graft: Secondary | ICD-10-CM | POA: Diagnosis not present

## 2017-05-12 DIAGNOSIS — I6523 Occlusion and stenosis of bilateral carotid arteries: Secondary | ICD-10-CM | POA: Diagnosis not present

## 2017-05-12 DIAGNOSIS — I252 Old myocardial infarction: Secondary | ICD-10-CM | POA: Diagnosis not present

## 2017-05-12 DIAGNOSIS — E7800 Pure hypercholesterolemia, unspecified: Secondary | ICD-10-CM | POA: Diagnosis not present

## 2017-05-12 DIAGNOSIS — E6609 Other obesity due to excess calories: Secondary | ICD-10-CM | POA: Diagnosis not present

## 2017-05-13 DIAGNOSIS — E7800 Pure hypercholesterolemia, unspecified: Secondary | ICD-10-CM | POA: Diagnosis not present

## 2017-05-13 DIAGNOSIS — I2510 Atherosclerotic heart disease of native coronary artery without angina pectoris: Secondary | ICD-10-CM | POA: Diagnosis not present

## 2017-05-13 DIAGNOSIS — Z955 Presence of coronary angioplasty implant and graft: Secondary | ICD-10-CM | POA: Diagnosis not present

## 2017-05-13 DIAGNOSIS — I6523 Occlusion and stenosis of bilateral carotid arteries: Secondary | ICD-10-CM | POA: Diagnosis not present

## 2017-05-14 DIAGNOSIS — H4912 Fourth [trochlear] nerve palsy, left eye: Secondary | ICD-10-CM | POA: Diagnosis not present

## 2017-05-14 DIAGNOSIS — H35363 Drusen (degenerative) of macula, bilateral: Secondary | ICD-10-CM | POA: Diagnosis not present

## 2017-05-16 ENCOUNTER — Other Ambulatory Visit: Payer: Self-pay | Admitting: Internal Medicine

## 2017-05-22 LAB — HM COLONOSCOPY

## 2017-05-26 DIAGNOSIS — I10 Essential (primary) hypertension: Secondary | ICD-10-CM | POA: Diagnosis not present

## 2017-05-26 DIAGNOSIS — D649 Anemia, unspecified: Secondary | ICD-10-CM | POA: Diagnosis not present

## 2017-05-26 DIAGNOSIS — R0609 Other forms of dyspnea: Secondary | ICD-10-CM | POA: Diagnosis not present

## 2017-05-26 DIAGNOSIS — L570 Actinic keratosis: Secondary | ICD-10-CM | POA: Diagnosis not present

## 2017-05-26 DIAGNOSIS — N289 Disorder of kidney and ureter, unspecified: Secondary | ICD-10-CM | POA: Diagnosis not present

## 2017-05-26 DIAGNOSIS — C44329 Squamous cell carcinoma of skin of other parts of face: Secondary | ICD-10-CM | POA: Diagnosis not present

## 2017-05-26 DIAGNOSIS — X32XXXA Exposure to sunlight, initial encounter: Secondary | ICD-10-CM | POA: Diagnosis not present

## 2017-05-28 DIAGNOSIS — D631 Anemia in chronic kidney disease: Secondary | ICD-10-CM | POA: Diagnosis not present

## 2017-05-28 DIAGNOSIS — Z923 Personal history of irradiation: Secondary | ICD-10-CM | POA: Diagnosis not present

## 2017-05-28 DIAGNOSIS — Z8546 Personal history of malignant neoplasm of prostate: Secondary | ICD-10-CM | POA: Diagnosis not present

## 2017-05-28 DIAGNOSIS — Z7982 Long term (current) use of aspirin: Secondary | ICD-10-CM | POA: Diagnosis not present

## 2017-05-28 DIAGNOSIS — I252 Old myocardial infarction: Secondary | ICD-10-CM | POA: Diagnosis not present

## 2017-05-28 DIAGNOSIS — I10 Essential (primary) hypertension: Secondary | ICD-10-CM | POA: Diagnosis not present

## 2017-05-28 DIAGNOSIS — D500 Iron deficiency anemia secondary to blood loss (chronic): Secondary | ICD-10-CM | POA: Diagnosis not present

## 2017-05-28 DIAGNOSIS — Z79899 Other long term (current) drug therapy: Secondary | ICD-10-CM | POA: Diagnosis not present

## 2017-05-31 DIAGNOSIS — Z923 Personal history of irradiation: Secondary | ICD-10-CM | POA: Diagnosis not present

## 2017-05-31 DIAGNOSIS — Z7982 Long term (current) use of aspirin: Secondary | ICD-10-CM | POA: Diagnosis not present

## 2017-05-31 DIAGNOSIS — I10 Essential (primary) hypertension: Secondary | ICD-10-CM | POA: Diagnosis not present

## 2017-05-31 DIAGNOSIS — I252 Old myocardial infarction: Secondary | ICD-10-CM | POA: Diagnosis not present

## 2017-05-31 DIAGNOSIS — D500 Iron deficiency anemia secondary to blood loss (chronic): Secondary | ICD-10-CM | POA: Diagnosis not present

## 2017-05-31 DIAGNOSIS — Z79899 Other long term (current) drug therapy: Secondary | ICD-10-CM | POA: Diagnosis not present

## 2017-05-31 DIAGNOSIS — D631 Anemia in chronic kidney disease: Secondary | ICD-10-CM | POA: Diagnosis not present

## 2017-05-31 DIAGNOSIS — Z8546 Personal history of malignant neoplasm of prostate: Secondary | ICD-10-CM | POA: Diagnosis not present

## 2017-06-02 DIAGNOSIS — R0609 Other forms of dyspnea: Secondary | ICD-10-CM | POA: Diagnosis not present

## 2017-06-02 DIAGNOSIS — D500 Iron deficiency anemia secondary to blood loss (chronic): Secondary | ICD-10-CM | POA: Diagnosis not present

## 2017-06-02 DIAGNOSIS — R0600 Dyspnea, unspecified: Secondary | ICD-10-CM | POA: Diagnosis not present

## 2017-06-02 LAB — PULMONARY FUNCTION TEST

## 2017-06-03 DIAGNOSIS — L820 Inflamed seborrheic keratosis: Secondary | ICD-10-CM | POA: Diagnosis not present

## 2017-06-03 DIAGNOSIS — D485 Neoplasm of uncertain behavior of skin: Secondary | ICD-10-CM | POA: Diagnosis not present

## 2017-06-03 DIAGNOSIS — Z4802 Encounter for removal of sutures: Secondary | ICD-10-CM | POA: Diagnosis not present

## 2017-06-04 DIAGNOSIS — R5383 Other fatigue: Secondary | ICD-10-CM | POA: Diagnosis not present

## 2017-06-04 DIAGNOSIS — D500 Iron deficiency anemia secondary to blood loss (chronic): Secondary | ICD-10-CM | POA: Diagnosis not present

## 2017-06-08 DIAGNOSIS — K5520 Angiodysplasia of colon without hemorrhage: Secondary | ICD-10-CM | POA: Diagnosis not present

## 2017-06-08 DIAGNOSIS — D500 Iron deficiency anemia secondary to blood loss (chronic): Secondary | ICD-10-CM | POA: Diagnosis not present

## 2017-06-08 DIAGNOSIS — K222 Esophageal obstruction: Secondary | ICD-10-CM | POA: Diagnosis not present

## 2017-06-08 DIAGNOSIS — K5730 Diverticulosis of large intestine without perforation or abscess without bleeding: Secondary | ICD-10-CM | POA: Diagnosis not present

## 2017-06-10 ENCOUNTER — Telehealth: Payer: Self-pay | Admitting: Internal Medicine

## 2017-06-10 DIAGNOSIS — D500 Iron deficiency anemia secondary to blood loss (chronic): Secondary | ICD-10-CM | POA: Diagnosis not present

## 2017-06-10 NOTE — Telephone Encounter (Signed)
I called the patient to schedule AWV-S.  There was no answer, and the voicemail was full. VDM (DD)

## 2017-06-22 DIAGNOSIS — D500 Iron deficiency anemia secondary to blood loss (chronic): Secondary | ICD-10-CM | POA: Diagnosis not present

## 2017-06-22 DIAGNOSIS — D649 Anemia, unspecified: Secondary | ICD-10-CM | POA: Diagnosis not present

## 2017-06-23 ENCOUNTER — Encounter: Payer: Self-pay | Admitting: *Deleted

## 2017-06-25 ENCOUNTER — Encounter: Payer: Self-pay | Admitting: Oncology

## 2017-06-29 DIAGNOSIS — D649 Anemia, unspecified: Secondary | ICD-10-CM | POA: Diagnosis not present

## 2017-06-29 DIAGNOSIS — D500 Iron deficiency anemia secondary to blood loss (chronic): Secondary | ICD-10-CM | POA: Diagnosis not present

## 2017-07-01 ENCOUNTER — Ambulatory Visit (INDEPENDENT_AMBULATORY_CARE_PROVIDER_SITE_OTHER)
Admission: RE | Admit: 2017-07-01 | Discharge: 2017-07-01 | Disposition: A | Discharge status: Home / Self Care | Payer: Medicare Other | Source: Ambulatory Visit | Attending: Internal Medicine | Admitting: Internal Medicine

## 2017-07-01 ENCOUNTER — Encounter: Payer: Self-pay | Admitting: Internal Medicine

## 2017-07-01 ENCOUNTER — Ambulatory Visit (INDEPENDENT_AMBULATORY_CARE_PROVIDER_SITE_OTHER): Payer: Medicare Other | Admitting: Internal Medicine

## 2017-07-01 VITALS — BP 128/68 | HR 72 | Ht 67.0 in | Wt 251.6 lb

## 2017-07-01 DIAGNOSIS — Z23 Encounter for immunization: Secondary | ICD-10-CM | POA: Diagnosis not present

## 2017-07-01 DIAGNOSIS — J4540 Moderate persistent asthma, uncomplicated: Secondary | ICD-10-CM | POA: Diagnosis not present

## 2017-07-01 DIAGNOSIS — R0602 Shortness of breath: Secondary | ICD-10-CM | POA: Diagnosis not present

## 2017-07-01 MED ORDER — BUDESONIDE-FORMOTEROL FUMARATE 160-4.5 MCG/ACT IN AERO: 2.0000 | INHALATION_SPRAY | Freq: Two times a day (BID) | RESPIRATORY_TRACT | 0 refills | Status: DC

## 2017-07-01 MED ORDER — BUDESONIDE-FORMOTEROL FUMARATE 160-4.5 MCG/ACT IN AERO: INHALATION_SPRAY | RESPIRATORY_TRACT | 11 refills | Status: DC

## 2017-07-01 NOTE — Patient Instructions (Addendum)
Plan A = Automatic = Symbicort 160 Take 2 puffs first thing in am and then another 2 puffs about 12 hours later.   Work on inhaler technique:  relax and gently blow all the way out then take a nice smooth deep breath back in, triggering the inhaler at same time you start breathing in.  Hold for up to 5 seconds if you can. Blow out thru nose. Rinse and gargle with water when done     Plan B = Backup Only use your albuterol as a rescue medication to be used if you can't catch your breath by resting or doing a relaxed purse lip breathing pattern.  - The less you use it, the better it will work when you need it. - Ok to use the inhaler up to 2 puffs  every 4 hours if you must but call for appointment if use goes up over your usual need - Don't leave home without it !!  (think of it like the spare tire for your car)    Plan C = Crisis - only use your ipatropium- albuterol nebulizer if you first try Plan B and it fails to help > ok to use the nebulizer up to every 4 hours but if start needing it regularly call for immediate appointment   Please schedule a follow up office visit in 4 weeks, sooner if needed with pfts on return

## 2017-07-01 NOTE — Progress Notes (Signed)
Subjective:     Patient ID: Kevin Kane, male   DOB: [birth year],    MRN: [id number]  HPI  71  yowm went to [place] smoking in 1960 s obvious sequelae self referred to pulmonary clinic  07/01/2017 cc sob with evidence of asthma on pfts from [place]    From 06/02/17 and over use of saba chronically    07/01/2017 1st [facility] office visit/ Sarely Stracener   Chief Complaint  Patient presents with  . Pulmonary Consult    Self referral. Pt c/o SOB for "many years". He gets winded walking short distances such as from lobby to exam room today.   doe x 20 years improves with saba gradually worse x  Now 50 ft and using saba in multiple forms day >> noct Also anemia last tx early Sept since RT   For prostate ca Sleeps ok flat / some mild leg swelling/ hb but no cough   No obvious day to day or daytime variability or assoc excess/ purulent sputum or mucus plugs or hemoptysis or cp or chest tightness, subjective wheeze or overt sinus  symptoms. No unusual exp hx or h/o childhood pna/ asthma or knowledge of premature birth.  Sleeping ok flat without nocturnal  or early am exacerbation  of respiratory  c/o's or need for noct saba. Also denies any obvious fluctuation of symptoms with weather or environmental changes or other aggravating or alleviating factors except as outlined above   Current Allergies, Complete Past Medical History, Past Surgical History, Family History, and Social History were reviewed in [employer] record.  ROS  The following are not active complaints unless bolded Hoarseness, sore throat, dysphagia, dental problems, itching, sneezing,  nasal congestion or discharge of excess mucus or purulent secretions, ear ache,   fever, chills, sweats, unintended wt loss or wt gain, classically pleuritic or exertional cp,  orthopnea pnd or leg swelling, presyncope, palpitations, abdominal pain, anorexia, nausea, vomiting, diarrhea  or change in bowel  habits or change in bladder habits, change in stools or change in urine, dysuria, hematuria,  rash, arthralgias, visual complaints, headache, numbness, weakness or ataxia or problems with walking or coordination,  change in mood/affect or memory.        Current Meds  Medication Sig  . aspirin 81 MG tablet Take 81 mg by mouth daily.  Marland Kitchen atorvastatin (LIPITOR) 80 MG tablet Take 80 mg by mouth every morning. Take one daily for cholesterol  . bicalutamide (CASODEX) 50 MG tablet Take 50 mg by mouth daily.   Marland Kitchen levothyroxine (SYNTHROID, LEVOTHROID) 25 MCG tablet TAKE 1 TABLET(25 MCG) BY MOUTH DAILY BEFORE BREAKFAST  . metoprolol succinate (TOPROL-XL) 50 MG 24 hr tablet Take 50 mg by mouth every morning. Take one tablet daily for blood pressure  . Multiple Vitamins-Minerals (CENTRUM SILVER ADULT 50+) TABS Take 1 tablet by mouth every morning.  Marland Kitchen POLY-IRON 150 150 MG capsule TAKE 1 CAPSULE(150 MG) BY MOUTH DAILY  . tamsulosin (FLOMAX) 0.4 MG CAPS capsule Take 0.4 mg by mouth daily after breakfast. Reported on 11/14/2015         Review of Systems     Objective:   Physical Exam Obese pleasant wm nad   Wt Readings from Last 3 Encounters:  07/01/17 251 lb 9.6 oz (114.1 kg)  12/25/16 248 lb 11.2 oz (112.8 kg)  12/21/16 249 lb 4.8 oz (113.1 kg)    Vital signs reviewed  - Note on arrival 02 sats  96% on RA     HEENT: nl dentition, turbinates bilaterally, and oropharynx. Nl external ear canals without cough reflex   NECK :  without JVD/Nodes/TM/ nl carotid upstrokes bilaterally   LUNGS: no acc muscle use,  Nl contour chest which is clear to A and P bilaterally without cough on insp or exp maneuvers   CV:  RRR  no s3 or murmur or increase in P2, and trace bilateral ankle  edema   ABD:  soft and nontender with nl inspiratory excursion in the supine position. No bruits or organomegaly appreciated, bowel sounds nl  MS:  Nl gait/ ext warm without deformities, calf tenderness, cyanosis or  clubbing No obvious joint restrictions   SKIN: warm and dry with a  Few ecchymoses and a single nodular rectangular shaped lesion with smooth surface approx 1 x 2 cm L forearm    NEURO:  alert, approp, nl sensorium with  no motor or cerebellar deficits apparent.     CXR PA and Lateral:   07/01/2017 :    I personally reviewed images and agree with radiology impression as follows:   Cardiomegaly. Very mild basilar interstitial prominence noted. Mild CHF cannot be excluded .     Lab Results  Component Value Date   HGB 9.7 (L) 12/21/2016   HGB 9.5 (L) 12/14/2016   HGB 9.7 (L) 12/01/2016   HGB 6.8 (LL) 11/26/2016          Assessment:

## 2017-07-02 ENCOUNTER — Encounter: Payer: Self-pay | Admitting: Internal Medicine

## 2017-07-02 NOTE — Assessment & Plan Note (Signed)
Body mass index is 39.41 kg/m.  -  trending up slightly  Lab Results  Component Value Date   TSH 3.98 11/21/2015     Contributing to gerd risk/ doe/reviewed the need and the process to achieve and maintain neg calorie balance > defer f/u primary care including intermittently monitoring thyroid status

## 2017-07-02 NOTE — Assessment & Plan Note (Addendum)
PFT's  1 /20/15  FEV1 1.52 (59 % ) ratio 67  p 8 % improvement from saba p ? prior to study with DLCO  38 % corrects to 56  % for alv volume  But not hgb Spirometry 07/01/2017  FEV1  1.46 (44%)  Ratio 60 p 22% from saba and classic f/v contours  - 07/01/2017  After extensive coaching HFA effectiveness =    75% from a baseline of 25% > try symb 80 2bid   Not really clear how much of his issues related to obesity /deconditioning/ anemia and whether copd with AB (as suggested by pft 09/2013 though not corrected for hgb or just asthma but clearly has component of airways dz = DDX of  difficult airways management almost all start with A and  include Adherence, Ace Inhibitors, Acid Reflux, Active Sinus Disease, Alpha 1 Antitripsin deficiency, Anxiety masquerading as Airways dz,  ABPA,  Allergy(esp in young), Aspiration (esp in elderly), Adverse effects of meds,  Active smokers, A bunch of PE's (a small clot burden can't cause this syndrome unless there is already severe underlying pulm or vascular dz with poor reserve) plus two Bs  = Bronchiectasis and Beta blocker use..and one C= CHF   In this case Adherence is the biggest issue and starts with  inability to use HFA effectively and also  understand that SABA treats the symptoms but doesn't get to the underlying problem (inflammation).  I used  the analogy of putting steroid cream on a rash to help explain the meaning of topical therapy and the need to get the drug to the target tissue.   - see hfa teaching above - return with all meds in hand using a trust but verify approach to confirm accurate Medication  Reconciliation The principal here is that until we are certain that the  patients are doing what we've asked, it makes no sense to ask them to do more.    ? Allergy > eval on return depending on response to rx but lack of rhinitis argues against   ? BB effect > unlikely from low dose toprol but if higher doses of BB needed Strongly prefer in this setting:  Bystolic, the most beta -1  selective Beta blocker available in sample form, with bisoprolol the most selective generic choice  on the market.    ? CHF > may have at least component of diastolic dysfunction exac by anemia but note can lie flat  - consider BNP next ov when returns for full pfts    Total time devoted to counseling  > 50 % of initial 60 min office visit:  review case with pt/ discussion of options/alternatives/ personally creating written customized instructions  in presence of pt  then going over those specific  Instructions directly with the pt including how to use all of the meds but in particular covering each new medication in detail and the difference between the maintenance= "automatic" meds and the prns using an action plan format for the latter (If this problem/symptom => do that organization reading Left to right).  Please see AVS from this visit for a full list of these instructions which I personally wrote for this pt and  are unique to this visit.

## 2017-07-06 ENCOUNTER — Telehealth: Payer: Self-pay

## 2017-07-06 ENCOUNTER — Ambulatory Visit (HOSPITAL_BASED_OUTPATIENT_CLINIC_OR_DEPARTMENT_OTHER): Payer: Medicare Other | Admitting: Oncology

## 2017-07-06 ENCOUNTER — Other Ambulatory Visit (HOSPITAL_BASED_OUTPATIENT_CLINIC_OR_DEPARTMENT_OTHER): Payer: Medicare Other

## 2017-07-06 VITALS — BP 167/54 | HR 70 | Temp 97.9°F | Resp 17 | Ht 67.0 in | Wt 252.5 lb

## 2017-07-06 DIAGNOSIS — C61 Malignant neoplasm of prostate: Secondary | ICD-10-CM

## 2017-07-06 DIAGNOSIS — N189 Chronic kidney disease, unspecified: Secondary | ICD-10-CM | POA: Diagnosis not present

## 2017-07-06 DIAGNOSIS — D649 Anemia, unspecified: Secondary | ICD-10-CM

## 2017-07-06 DIAGNOSIS — D6489 Other specified anemias: Secondary | ICD-10-CM

## 2017-07-06 DIAGNOSIS — N183 Chronic kidney disease, stage 3 unspecified: Secondary | ICD-10-CM

## 2017-07-06 DIAGNOSIS — D631 Anemia in chronic kidney disease: Secondary | ICD-10-CM | POA: Insufficient documentation

## 2017-07-06 LAB — CBC WITH DIFFERENTIAL/PLATELET
BASO%: 0.5 % (ref 0.0–2.0)
BASOS ABS: 0 10*3/uL (ref 0.0–0.1)
EOS%: 3 % (ref 0.0–7.0)
Eosinophils Absolute: 0.2 10*3/uL (ref 0.0–0.5)
HEMATOCRIT: 25.4 % — AB (ref 38.4–49.9)
HGB: 8.2 g/dL — ABNORMAL LOW (ref 13.0–17.1)
LYMPH#: 0.4 10*3/uL — AB (ref 0.9–3.3)
LYMPH%: 4.6 % — AB (ref 14.0–49.0)
MCH: 28.6 pg (ref 27.2–33.4)
MCHC: 32.4 g/dL (ref 32.0–36.0)
MCV: 88.4 fL (ref 79.3–98.0)
MONO#: 0.9 10*3/uL (ref 0.1–0.9)
MONO%: 11.4 % (ref 0.0–14.0)
NEUT#: 6.5 10*3/uL (ref 1.5–6.5)
NEUT%: 80.5 % — AB (ref 39.0–75.0)
Platelets: 271 10*3/uL (ref 140–400)
RBC: 2.87 10*6/uL — AB (ref 4.20–5.82)
RDW: 19.1 % — ABNORMAL HIGH (ref 11.0–14.6)
WBC: 8.1 10*3/uL (ref 4.0–10.3)

## 2017-07-06 NOTE — Telephone Encounter (Signed)
Scheduled appointment with sickle clinic for 2 units.  Called patient with new scheduled appointment date and times with changes made to ajust patient schedule. Per 10/16 los

## 2017-07-06 NOTE — Telephone Encounter (Signed)
Called patient with additioal changes to his appointment schedule per request. 10/16

## 2017-07-06 NOTE — Progress Notes (Signed)
Hematology and Oncology Follow Up Visit  Kevin Kane [id number] 1930-05-30 83 y.o. 07/06/2017 11:53 AM [name], [name], DOReed, [name], DO   Principle Diagnosis: 83 year old gentleman with the following diagnoses:  1. Prostate cancer diagnosed in September 2017. He had a Gleason score 4+5 = 9 clinical stage T2c. his PSA was 15.9.  2. Multifactorial anemia workup is ongoing diagnosed in March 2018.   Prior Therapy:   He is status post androgen deprivation therapy followed by radiation therapy completed in December 2017.  He is status post a bone marrow biopsy in April 2018 which did not show any myelodysplasia.  Current therapy: supportive care only.  Interim History:  Kevin Kane presents today for a follow-up visit. Since the last visit, he reports no major complaints in his health. He spent the last 6 months in [place] where he stays over the summer.He was seen by hematology while he was there and underwent a colonoscopy which was unremarkable. Since his return, he has had frequent hemoglobin checks with a hemoglobin dropping to 8.2. He is mildly symptomatic with fatigue and dyspnea on exertion.   He does not report any headaches, blurry vision, syncope or seizures. He does not report any fevers, chills, sweats or weight loss. He isn't report any chest pain, palpitation, orthopnea or leg edema. He does not report any cough, wheezing or hemoptysis. He does not report any nausea, vomiting or abdominal pain. He does not report any frequency urgency or hesitancy. He does not report any hematuria or dysuria. He does not report any skeletal complaints. Remaining review of systems unremarkable.   Medications: I have reviewed the patient's current medications.  Current Outpatient Prescriptions  Medication Sig Dispense Refill  . aspirin 81 MG tablet Take 81 mg by mouth daily.    Marland Kitchen atorvastatin (LIPITOR) 80 MG tablet Take 80 mg by mouth every morning. Take one daily for cholesterol     . bicalutamide (CASODEX) 50 MG tablet Take 50 mg by mouth daily.     . budesonide-formoterol (SYMBICORT) 160-4.5 MCG/ACT inhaler Take 2 puffs first thing in am and then another 2 puffs about 12 hours later. 1 Inhaler 11  . levothyroxine (SYNTHROID, LEVOTHROID) 25 MCG tablet TAKE 1 TABLET(25 MCG) BY MOUTH DAILY BEFORE BREAKFAST 30 tablet 11  . metoprolol succinate (TOPROL-XL) 50 MG 24 hr tablet Take 50 mg by mouth every morning. Take one tablet daily for blood pressure    . Multiple Vitamins-Minerals (CENTRUM SILVER ADULT 50+) TABS Take 1 tablet by mouth every morning.    Marland Kitchen POLY-IRON 150 150 MG capsule TAKE 1 CAPSULE(150 MG) BY MOUTH DAILY 30 capsule 0  . tamsulosin (FLOMAX) 0.4 MG CAPS capsule Take 0.4 mg by mouth daily after breakfast. Reported on 11/14/2015     No current facility-administered medications for this visit.      Allergies:  Allergies  Allergen Reactions  . Ketoconazole Other (See Comments)    Adverse skin reaction-- legs turned red and purple    Past Medical History, Surgical history, Social history, and Family History were reviewed and updated.  Review of Systems:  Physical Exam: Blood pressure (!) 167/54, pulse 70, temperature 97.9 F (36.6 C), temperature source Oral, resp. rate 17, height 5' 7"  (1.702 m), weight 252 lb 8 oz (114.5 kg), SpO2 95 %. ECOG: 1 General appearance: well-appearing gentleman without distress. Head: Normocephalic, without obvious abnormality no thrush or ulcers. Neck: no adenopathy or neck masses. Lymph nodes: Cervical, supraclavicular, and axillary nodes normal. Heart:regular rate  and rhythm, S1, S2 normal, no murmur, click, rub or gallop Lung:chest clear, no wheezing, rales, normal symmetric air entry Abdomin: soft, non-tender, without masses or organomegaly no shifting dullness or ascites. EXT:no erythema, induration, or nodules without edema.   Lab Results: Lab Results  Component Value Date   WBC 8.1 07/06/2017   HGB 8.2 (L)  07/06/2017   HCT 25.4 (L) 07/06/2017   MCV 88.4 07/06/2017   PLT 271 07/06/2017     Chemistry      Component Value Date/Time   NA 142 12/21/2016 0931   NA 146 (H) 11/26/2016 1156   K 4.2 12/21/2016 0931   K 4.1 11/26/2016 1156   CL 108 12/21/2016 0931   CO2 26 12/21/2016 0931   CO2 27 11/26/2016 1156   BUN 38 (H) 12/21/2016 0931   BUN 32.3 (H) 11/26/2016 1156   CREATININE 1.56 (H) 12/21/2016 0931   CREATININE 1.8 (H) 11/26/2016 1156   GLU 100 11/19/2016      Component Value Date/Time   CALCIUM 9.4 12/21/2016 0931   CALCIUM 9.7 11/26/2016 1156   ALKPHOS 102 11/26/2016 1156   AST 20 11/26/2016 1156   ALT 19 11/26/2016 1156   BILITOT 0.52 11/26/2016 1156       Impression and Plan:  83 year old gentleman with the following issues:  1. Prostate cancer diagnosed in August 2017. He presented with a PSA of 15.9 and clinical stage TIIc. His prostate biopsy showed a Gleason score 4+5 = 9 with pattern 4+4 = 8 also noted. 11 out of 12 cores were involved with cancer. Staging workup did not show any evidence of metastatic disease and he has very little urinary tract symptoms at this time.  He completed definitive radiation therapy after androgen deprivation therapy in December 2018. His PSA on 11/19/2016 was 0.23.  2. Multifactorial anemia: he has anemia of renal insufficiency as well as chronic disease.  His workup has been completed and his no clear-cut pathology noted. His iron studies, B12, serum protein electrophoresis all within normal range. His bone marrow biopsy obtained on 12/21/2016 showed no clear-cut myelodysplastic syndrome, infiltrative bone marrow process or any other pathology.  His hemoglobin today is 8.2 and he is mildly symptomatic. The plan is to proceed with 2 units of packed red cell transfusion in the immediate future.  Risks and benefits of starting Aranesp 300 g every 4 weeks was discussed today.Complications from this medication include hypertension and  thrombosis. He would benefit from growth factor support given his renal insufficiency.    3. Follow-up: Will be in one month to follow his progress.   Zola Button, MD 10/16/201811:53 AM

## 2017-07-07 ENCOUNTER — Other Ambulatory Visit (HOSPITAL_BASED_OUTPATIENT_CLINIC_OR_DEPARTMENT_OTHER): Payer: Medicare Other

## 2017-07-07 ENCOUNTER — Ambulatory Visit (HOSPITAL_COMMUNITY)
Admission: RE | Admit: 2017-07-07 | Discharge: 2017-07-07 | Disposition: A | Discharge status: Home / Self Care | Payer: Medicare Other | Source: Ambulatory Visit | Attending: Oncology | Admitting: Oncology

## 2017-07-07 ENCOUNTER — Ambulatory Visit (HOSPITAL_BASED_OUTPATIENT_CLINIC_OR_DEPARTMENT_OTHER): Payer: Medicare Other

## 2017-07-07 VITALS — BP 151/48 | HR 71 | Temp 98.1°F | Resp 16

## 2017-07-07 DIAGNOSIS — C61 Malignant neoplasm of prostate: Secondary | ICD-10-CM | POA: Diagnosis present

## 2017-07-07 DIAGNOSIS — D649 Anemia, unspecified: Secondary | ICD-10-CM

## 2017-07-07 DIAGNOSIS — D631 Anemia in chronic kidney disease: Secondary | ICD-10-CM

## 2017-07-07 DIAGNOSIS — N183 Chronic kidney disease, stage 3 (moderate): Secondary | ICD-10-CM | POA: Diagnosis present

## 2017-07-07 LAB — IRON AND TIBC
%SAT: 7 % — AB (ref 20–55)
IRON: 27 ug/dL — AB (ref 42–163)
TIBC: 386 ug/dL (ref 202–409)
UIBC: 360 ug/dL (ref 117–376)

## 2017-07-07 LAB — FERRITIN: FERRITIN: 33 ng/mL (ref 22–316)

## 2017-07-07 MED ORDER — DARBEPOETIN ALFA 300 MCG/0.6ML IJ SOSY
300.0000 ug | PREFILLED_SYRINGE | Freq: Once | INTRAMUSCULAR | Status: AC
  Administered 2017-07-07: 300 ug via SUBCUTANEOUS
  Filled 2017-07-07: qty 0.6

## 2017-07-07 NOTE — Patient Instructions (Signed)

## 2017-07-08 ENCOUNTER — Ambulatory Visit (HOSPITAL_COMMUNITY)
Admission: RE | Admit: 2017-07-08 | Discharge: 2017-07-08 | Disposition: A | Discharge status: Home / Self Care | Payer: Medicare Other | Source: Ambulatory Visit | Attending: Oncology | Admitting: Oncology

## 2017-07-08 ENCOUNTER — Telehealth: Payer: Self-pay

## 2017-07-08 DIAGNOSIS — D649 Anemia, unspecified: Secondary | ICD-10-CM | POA: Diagnosis not present

## 2017-07-08 LAB — PREPARE RBC (CROSSMATCH)

## 2017-07-08 MED ORDER — SODIUM CHLORIDE 0.9 % IV SOLN
250.0000 mL | Freq: Once | INTRAVENOUS | Status: AC
  Administered 2017-07-08: 250 mL via INTRAVENOUS

## 2017-07-08 MED ORDER — HEPARIN SOD (PORK) LOCK FLUSH 100 UNIT/ML IV SOLN: 250.0000 [IU] | INTRAVENOUS | Status: DC | PRN

## 2017-07-08 MED ORDER — HEPARIN SOD (PORK) LOCK FLUSH 100 UNIT/ML IV SOLN: 500.0000 [IU] | Freq: Every day | INTRAVENOUS | Status: DC | PRN

## 2017-07-08 MED ORDER — DIPHENHYDRAMINE HCL 25 MG PO CAPS
25.0000 mg | ORAL_CAPSULE | Freq: Once | ORAL | Status: AC
  Administered 2017-07-08: 25 mg via ORAL
  Filled 2017-07-08: qty 1

## 2017-07-08 MED ORDER — SODIUM CHLORIDE 0.9% FLUSH: 3.0000 mL | INTRAVENOUS | Status: DC | PRN

## 2017-07-08 MED ORDER — ACETAMINOPHEN 325 MG PO TABS
650.0000 mg | ORAL_TABLET | Freq: Once | ORAL | Status: AC
  Administered 2017-07-08: 650 mg via ORAL
  Filled 2017-07-08: qty 2

## 2017-07-08 MED ORDER — SODIUM CHLORIDE 0.9% FLUSH: 10.0000 mL | INTRAVENOUS | Status: DC | PRN

## 2017-07-08 NOTE — Telephone Encounter (Signed)
Pt called that when he saw Dr Alen Blew on 10/16 he was verbally told to stop his iron tabs b/c he was started on aranesp shot. The iron tabs are still on his MAR. He is clarifying he heard correctly. Instructed pt to not take his iron tabs and Dr Alen Blew will be back in office on Wednesday.

## 2017-07-08 NOTE — Discharge Instructions (Signed)

## 2017-07-08 NOTE — Progress Notes (Signed)
Pt admitted to [facility] for transfusion of 2 units PRBC per Dr. Alen Blew. Pt received transfusion without complication. Alert, oriented and ambulatory at time of discharge. Coolidge Breeze, RN 07/08/2017 [phone]

## 2017-07-09 LAB — TYPE AND SCREEN
ABO/RH(D): O POS
ANTIBODY SCREEN: NEGATIVE
UNIT DIVISION: 0
Unit division: 0

## 2017-07-09 LAB — BPAM RBC
Blood Product Expiration Date: 201811092359
Blood Product Expiration Date: 201811092359
ISSUE DATE / TIME: 201810180809
ISSUE DATE / TIME: 201810180809
UNIT TYPE AND RH: 5100
Unit Type and Rh: 5100

## 2017-07-13 NOTE — Telephone Encounter (Signed)
He is to stop oral iron. I will discuss him IV iron given that his oral iron is not effective.

## 2017-07-14 NOTE — Telephone Encounter (Signed)
S/w pt per Dr Alen Blew reply

## 2017-07-26 DIAGNOSIS — L905 Scar conditions and fibrosis of skin: Secondary | ICD-10-CM | POA: Diagnosis not present

## 2017-07-26 DIAGNOSIS — L820 Inflamed seborrheic keratosis: Secondary | ICD-10-CM | POA: Diagnosis not present

## 2017-07-26 DIAGNOSIS — D485 Neoplasm of uncertain behavior of skin: Secondary | ICD-10-CM | POA: Diagnosis not present

## 2017-07-26 DIAGNOSIS — L821 Other seborrheic keratosis: Secondary | ICD-10-CM | POA: Diagnosis not present

## 2017-07-26 DIAGNOSIS — C44629 Squamous cell carcinoma of skin of left upper limb, including shoulder: Secondary | ICD-10-CM | POA: Diagnosis not present

## 2017-07-27 ENCOUNTER — Non-Acute Institutional Stay: Payer: Medicare Other

## 2017-07-27 VITALS — BP 156/70 | HR 67 | Temp 97.7°F | Ht 67.0 in | Wt 251.0 lb

## 2017-07-27 DIAGNOSIS — Z0000 Encounter for general adult medical examination without abnormal findings: Secondary | ICD-10-CM

## 2017-07-27 MED ORDER — ZOSTER VAC RECOMB ADJUVANTED 50 MCG/0.5ML IM SUSR: 0.5000 mL | Freq: Once | INTRAMUSCULAR | 1 refills | Status: AC

## 2017-07-27 MED ORDER — PNEUMOCOCCAL VAC POLYVALENT 25 MCG/0.5ML IJ INJ: 0.5000 mL | INJECTION | INTRAMUSCULAR | 0 refills | Status: AC

## 2017-07-27 NOTE — Progress Notes (Signed)
Subjective:   Kevin Kane is a 83 y.o. male who presents for Medicare Annual/Subsequent preventive examination at [facility]       Objective:    Vitals: BP (!) 156/70   Pulse 67   Temp 97.7 F (36.5 C) (Oral)   Ht 5\' 7"  (1.702 m)   Wt 251 lb (113.9 kg)   SpO2 95%   BMI 39.31 kg/m   Body mass index is 39.31 kg/m.  Tobacco Social History   Tobacco Use  Smoking Status Former Smoker  . Packs/day: 1.00  . Years: 12.00  . Pack years: 12.00  . Types: Cigarettes  . Last attempt to quit: 09/21/1958  . Years since quitting: 58.8  Smokeless Tobacco Never Used     Counseling given: Not Answered   Past Medical History:  Diagnosis Date  . At risk for sleep apnea    STOP-BANG=5      SENT TO PCP 09-25-2014  . Bilateral lower extremity edema   . COPD mixed type [facility]) pulmologist-  dr Reginia Naas [facility])  note in epic under Care Everywhere tab)   per PFTs: 10/10/2013: Evidence of moderate obstructive lung disease with small airway involvement (asthma); FEV1 54% predicted, FEV1/FVC <70%, FEF25-75% is reduced. Mild response to bronchodilator; FEV1 w/ 8% change after bronchodilator.    -- and  mild intermittant asthma  . Coronary atherosclerosis of native coronary artery    cardiologist-  dr schwengel  in [place], [place] (where pt lives during spring and summer)  . Eczema   . Elevated prostate specific antigen (PSA) 10/10/2006  . First degree heart block   . Frequency of urination   . GERD (gastroesophageal reflux disease)   . Herpes simplex of eye    bilateral eye-  takes acyclovir daily  . History of kidney stones   . History of MI (myocardial infarction)    2001--  s/p  PCI and stent  . Hyperlipidemia   . Hypertension   . Hypothyroidism   . Mild intermittent asthma   . Nephrolithiasis    right  . OA (osteoarthritis)   . Prostate cancer ([place])   . Rheumatoid arthritis ([place]) 05/2013  . Right ureteral stone   . Urgency of urination   . Wears  glasses   . Wears hearing aid    bilateral   Past Surgical History:  Procedure Laterality Date  . CATARACT EXTRACTION W/ INTRAOCULAR LENS  IMPLANT, BILATERAL Bilateral 2014  . COLONOSCOPY  2010  . CORONARY ANGIOPLASTY WITH STENT PLACEMENT  2001   ([address] [place])   PCI and stenting  . PILONIDAL CYST EXCISION  2006  . TONSILLECTOMY  as child  . TOTAL HIP ARTHROPLASTY Left 2001   Family History  Problem Relation Age of Onset  . Stroke Mother   . Heart disease Father   . Cancer Neg Hx    Social History   Substance and Sexual Activity  Sexual Activity Not Currently    Outpatient Encounter Medications as of 07/27/2017  Medication Sig  . acyclovir (ZOVIRAX) 400 MG tablet Take 400 mg 5 (five) times daily by mouth.  Marland Kitchen aspirin 81 MG tablet Take 81 mg by mouth daily.  Marland Kitchen atorvastatin (LIPITOR) 80 MG tablet Take 80 mg by mouth every morning. Take one daily for cholesterol  . bicalutamide (CASODEX) 50 MG tablet Take 50 mg by mouth daily.   . budesonide-formoterol (SYMBICORT) 160-4.5 MCG/ACT inhaler Take 2 puffs first thing in am and then another 2 puffs  about 12 hours later.  . calcium citrate (CALCITRATE - DOSED IN MG ELEMENTAL CALCIUM) 950 MG tablet Take 200 mg of elemental calcium daily by mouth.  . levothyroxine (SYNTHROID, LEVOTHROID) 25 MCG tablet TAKE 1 TABLET(25 MCG) BY MOUTH DAILY BEFORE BREAKFAST  . metoprolol succinate (TOPROL-XL) 50 MG 24 hr tablet Take 50 mg by mouth every morning. Take one tablet daily for blood pressure  . Multiple Vitamins-Minerals (CENTRUM SILVER ADULT 50+) TABS Take 1 tablet by mouth every morning.  . tamsulosin (FLOMAX) 0.4 MG CAPS capsule Take 0.4 mg by mouth daily after breakfast. Reported on 11/14/2015  . [DISCONTINUED] POLY-IRON 150 150 MG capsule TAKE 1 CAPSULE(150 MG) BY MOUTH DAILY   No facility-administered encounter medications on file as of 07/27/2017.     Activities of Daily Living In your present state of health, do you have any difficulty  performing the following activities: 07/27/2017 12/21/2016  Hearing? Y N  Vision? N N  Comment Sees Eye docor in [place] -  Difficulty concentrating or making decisions? N N  Walking or climbing stairs? N Y  Dressing or bathing? N N  Doing errands, shopping? N -  Preparing Food and eating ? N -  Using the Toilet? N -  In the past six months, have you accidently leaked urine? Y -  Do you have problems with loss of bowel control? N -  Managing your Medications? N -  Managing your Finances? N -  Housekeeping or managing your Housekeeping? N -  Some recent data might be hidden    Patient Care Team: Gayland Curry, DO as PCP - General (Geriatric Medicine) [facility], [facility]   Assessment:      Exercise Activities and Dietary recommendations Current Exercise Habits: The patient does not participate in regular exercise at present, Exercise limited by: orthopedic condition(s)  Goals    None     Fall Risk Fall Risk  07/27/2017 11/25/2016 11/18/2016 06/29/2016 12/25/2015  Falls in the past year? No No No No No  Number falls in past yr: - - - - -  Injury with Fall? - - - - -   Depression Screen PHQ 2/9 Scores 07/27/2017 11/25/2016 11/18/2016 06/29/2016  PHQ - 2 Score 0 0 0 0    Cognitive Function MMSE - Mini Mental State Exam 07/27/2017  Orientation to time 5  Orientation to Place 5  Registration 3  Attention/ Calculation 5  Recall 3  Language- name 2 objects 2  Language- repeat 1  Language- follow 3 step command 3  Language- read & follow direction 1  Write a sentence 1  Copy design 1  Total score 30        Immunization History  Administered Date(s) Administered  . Influenza Whole 09/22/2011  . Influenza, High Dose Seasonal PF 07/01/2017  . Influenza-Unspecified 05/22/2013, 07/05/2014, 06/22/2015, 05/22/2016  . Pneumococcal Conjugate-13 04/18/2010  . Tdap 03/21/2015  . Zoster 04/18/2006   Screening Tests Health Maintenance  Topic Date Due  . PNA vac  Low Risk Adult (2 of 2 - PPSV23) 04/19/2011  . TETANUS/TDAP  03/20/2025  . INFLUENZA VACCINE  Completed      Plan:    I have personally reviewed and addressed the Medicare Annual Wellness questionnaire and have noted the following in the patient's chart:  A. Medical and social history B. Use of alcohol, tobacco or illicit drugs  C. Current medications and supplements D. Functional ability and status E.  Nutritional status F.  Physical activity G.  Advance directives H. List of other physicians I.  Hospitalizations, surgeries, and ER visits in previous 12 months J.  [place] to include hearing, vision, cognitive, depression L. Referrals and appointments - none  In addition, I have reviewed and discussed with patient certain preventive protocols, quality metrics, and best practice recommendations. A written personalized care plan for preventive services as well as general preventive health recommendations were provided to patient.  See attached scanned questionnaire for additional information.   Signed,   Rich Reining, RN Nurse Health Advisor   Quick Notes   Health Maintenance: Pneumovax and shingrix prescriptions sent to pharmacy     Abnormal Screen: MMSE 30/30. Passed clock drawing     Patient Concerns: None     Nurse Concerns: None

## 2017-07-27 NOTE — Patient Instructions (Signed)
[name] , Thank you for taking time to come for your Medicare Wellness Visit. I appreciate your ongoing commitment to your health goals. Please review the following plan we discussed and let me know if I can assist you in the future.   Screening recommendations/referrals: Colonoscopy excluded, you are over age 81 Recommended yearly ophthalmology/optometry visit for glaucoma screening and checkup Recommended yearly dental visit for hygiene and checkup  Vaccinations: Influenza vaccine up to date due 2019 fall seaons Pneumococcal vaccine 23 due, ordered to pharmacy Tdap vaccine up to date. Due 03/20/2025 Shingles vaccine due, prescription sent to pharmacy  Advanced directives: In Chart  Conditions/risks identified: None  Next appointment: Dr. Mariea Clonts 08/11/2017 @ 11am  Preventive Care 65 Years and Older, Male Preventive care refers to lifestyle choices and visits with your health care provider that can promote health and wellness. What does preventive care include?  A yearly physical exam. This is also called an annual well check.  Dental exams once or twice a year.  Routine eye exams. Ask your health care provider how often you should have your eyes checked.  Personal lifestyle choices, including:  Daily care of your teeth and gums.  Regular physical activity.  Eating a healthy diet.  Avoiding tobacco and drug use.  Limiting alcohol use.  Practicing safe sex.  Taking low doses of aspirin every day.  Taking vitamin and mineral supplements as recommended by your health care provider. What happens during an annual well check? The services and screenings done by your health care provider during your annual well check will depend on your age, overall health, lifestyle risk factors, and family history of disease. Counseling  Your health care provider may ask you questions about your:  Alcohol use.  Tobacco use.  Drug use.  Emotional well-being.  Home and relationship  well-being.  Sexual activity.  Eating habits.  History of falls.  Memory and ability to understand (cognition).  Work and work Statistician. Screening  You may have the following tests or measurements:  Height, weight, and BMI.  Blood pressure.  Lipid and cholesterol levels. These may be checked every 5 years, or more frequently if you are over 22 years old.  Skin check.  Lung cancer screening. You may have this screening every year starting at age 21 if you have a 30-pack-year history of smoking and currently smoke or have quit within the past 15 years.  Fecal occult blood test (FOBT) of the stool. You may have this test every year starting at age 86.  Flexible sigmoidoscopy or colonoscopy. You may have a sigmoidoscopy every 5 years or a colonoscopy every 10 years starting at age 7.  Prostate cancer screening. Recommendations will vary depending on your family history and other risks.  Hepatitis C blood test.  Hepatitis B blood test.  Sexually transmitted disease (STD) testing.  Diabetes screening. This is done by checking your blood sugar (glucose) after you have not eaten for a while (fasting). You may have this done every 1-3 years.  Abdominal aortic aneurysm (AAA) screening. You may need this if you are a current or former smoker.  Osteoporosis. You may be screened starting at age 57 if you are at high risk. Talk with your health care provider about your test results, treatment options, and if necessary, the need for more tests. Vaccines  Your health care provider may recommend certain vaccines, such as:  Influenza vaccine. This is recommended every year.  Tetanus, diphtheria, and acellular pertussis (Tdap, Td) vaccine. You  may need a Td booster every 10 years.  Zoster vaccine. You may need this after age 82.  Pneumococcal 13-valent conjugate (PCV13) vaccine. One dose is recommended after age 79.  Pneumococcal polysaccharide (PPSV23) vaccine. One dose is  recommended after age 81. Talk to your health care provider about which screenings and vaccines you need and how often you need them. This information is not intended to replace advice given to you by your health care provider. Make sure you discuss any questions you have with your health care provider. Document Released: 10/04/2015 Document Revised: 05/27/2016 Document Reviewed: 07/09/2015 Elsevier Interactive Patient Education  2017 [place] Prevention in the Home Falls can cause injuries. They can happen to people of all ages. There are many things you can do to make your home safe and to help prevent falls. What can I do on the outside of my home?  Regularly fix the edges of walkways and driveways and fix any cracks.  Remove anything that might make you trip as you walk through a door, such as a raised step or threshold.  Trim any bushes or trees on the path to your home.  Use bright outdoor lighting.  Clear any walking paths of anything that might make someone trip, such as rocks or tools.  Regularly check to see if handrails are loose or broken. Make sure that both sides of any steps have handrails.  Any raised decks and porches should have guardrails on the edges.  Have any leaves, snow, or ice cleared regularly.  Use sand or salt on walking paths during winter.  Clean up any spills in your garage right away. This includes oil or grease spills. What can I do in the bathroom?  Use night lights.  Install grab bars by the toilet and in the tub and shower. Do not use towel bars as grab bars.  Use non-skid mats or decals in the tub or shower.  If you need to sit down in the shower, use a plastic, non-slip stool.  Keep the floor dry. Clean up any water that spills on the floor as soon as it happens.  Remove soap buildup in the tub or shower regularly.  Attach bath mats securely with double-sided non-slip rug tape.  Do not have throw rugs and other things on  the floor that can make you trip. What can I do in the bedroom?  Use night lights.  Make sure that you have a light by your bed that is easy to reach.  Do not use any sheets or blankets that are too big for your bed. They should not hang down onto the floor.  Have a firm chair that has side arms. You can use this for support while you get dressed.  Do not have throw rugs and other things on the floor that can make you trip. What can I do in the kitchen?  Clean up any spills right away.  Avoid walking on wet floors.  Keep items that you use a lot in easy-to-reach places.  If you need to reach something above you, use a strong step stool that has a grab bar.  Keep electrical cords out of the way.  Do not use floor polish or wax that makes floors slippery. If you must use wax, use non-skid floor wax.  Do not have throw rugs and other things on the floor that can make you trip. What can I do with my stairs?  Do not leave any items  on the stairs.  Make sure that there are handrails on both sides of the stairs and use them. Fix handrails that are broken or loose. Make sure that handrails are as long as the stairways.  Check any carpeting to make sure that it is firmly attached to the stairs. Fix any carpet that is loose or worn.  Avoid having throw rugs at the top or bottom of the stairs. If you do have throw rugs, attach them to the floor with carpet tape.  Make sure that you have a light switch at the top of the stairs and the bottom of the stairs. If you do not have them, ask someone to add them for you. What else can I do to help prevent falls?  Wear shoes that:  Do not have high heels.  Have rubber bottoms.  Are comfortable and fit you well.  Are closed at the toe. Do not wear sandals.  If you use a stepladder:  Make sure that it is fully opened. Do not climb a closed stepladder.  Make sure that both sides of the stepladder are locked into place.  Ask someone to  hold it for you, if possible.  Clearly mark and make sure that you can see:  Any grab bars or handrails.  First and last steps.  Where the edge of each step is.  Use tools that help you move around (mobility aids) if they are needed. These include:  Canes.  Walkers.  Scooters.  Crutches.  Turn on the lights when you go into a dark area. Replace any light bulbs as soon as they burn out.  Set up your furniture so you have a clear path. Avoid moving your furniture around.  If any of your floors are uneven, fix them.  If there are any pets around you, be aware of where they are.  Review your medicines with your doctor. Some medicines can make you feel dizzy. This can increase your chance of falling. Ask your doctor what other things that you can do to help prevent falls. This information is not intended to replace advice given to you by your health care provider. Make sure you discuss any questions you have with your health care provider. Document Released: 07/04/2009 Document Revised: 02/13/2016 Document Reviewed: 10/12/2014 Elsevier Interactive Patient Education  2017 [employer].

## 2017-07-29 DIAGNOSIS — C61 Malignant neoplasm of prostate: Secondary | ICD-10-CM | POA: Diagnosis not present

## 2017-07-29 DIAGNOSIS — Z5111 Encounter for antineoplastic chemotherapy: Secondary | ICD-10-CM | POA: Diagnosis not present

## 2017-08-03 ENCOUNTER — Telehealth: Payer: Self-pay | Admitting: Oncology

## 2017-08-03 ENCOUNTER — Other Ambulatory Visit (HOSPITAL_BASED_OUTPATIENT_CLINIC_OR_DEPARTMENT_OTHER): Payer: Medicare Other

## 2017-08-03 ENCOUNTER — Ambulatory Visit (HOSPITAL_BASED_OUTPATIENT_CLINIC_OR_DEPARTMENT_OTHER): Payer: Medicare Other | Admitting: Oncology

## 2017-08-03 ENCOUNTER — Ambulatory Visit (HOSPITAL_BASED_OUTPATIENT_CLINIC_OR_DEPARTMENT_OTHER): Payer: Medicare Other

## 2017-08-03 VITALS — BP 158/56 | HR 66 | Temp 97.7°F | Resp 18 | Ht 67.0 in | Wt 253.7 lb

## 2017-08-03 DIAGNOSIS — C61 Malignant neoplasm of prostate: Secondary | ICD-10-CM | POA: Diagnosis not present

## 2017-08-03 DIAGNOSIS — N183 Chronic kidney disease, stage 3 unspecified: Secondary | ICD-10-CM

## 2017-08-03 DIAGNOSIS — N189 Chronic kidney disease, unspecified: Secondary | ICD-10-CM

## 2017-08-03 DIAGNOSIS — D631 Anemia in chronic kidney disease: Secondary | ICD-10-CM

## 2017-08-03 DIAGNOSIS — D649 Anemia, unspecified: Secondary | ICD-10-CM

## 2017-08-03 LAB — CBC WITH DIFFERENTIAL/PLATELET
BASO%: 0.4 % (ref 0.0–2.0)
Basophils Absolute: 0 10*3/uL (ref 0.0–0.1)
EOS ABS: 0.2 10*3/uL (ref 0.0–0.5)
EOS%: 1.9 % (ref 0.0–7.0)
HEMATOCRIT: 33.8 % — AB (ref 38.4–49.9)
HGB: 10.8 g/dL — ABNORMAL LOW (ref 13.0–17.1)
LYMPH#: 0.5 10*3/uL — AB (ref 0.9–3.3)
LYMPH%: 5.7 % — ABNORMAL LOW (ref 14.0–49.0)
MCH: 27.9 pg (ref 27.2–33.4)
MCHC: 32.1 g/dL (ref 32.0–36.0)
MCV: 87.1 fL (ref 79.3–98.0)
MONO#: 0.9 10*3/uL (ref 0.1–0.9)
MONO%: 10.8 % (ref 0.0–14.0)
NEUT#: 6.9 10*3/uL — ABNORMAL HIGH (ref 1.5–6.5)
NEUT%: 81.2 % — AB (ref 39.0–75.0)
PLATELETS: 265 10*3/uL (ref 140–400)
RBC: 3.88 10*6/uL — ABNORMAL LOW (ref 4.20–5.82)
RDW: 17 % — ABNORMAL HIGH (ref 11.0–14.6)
WBC: 8.5 10*3/uL (ref 4.0–10.3)

## 2017-08-03 MED ORDER — DARBEPOETIN ALFA 300 MCG/0.6ML IJ SOSY
300.0000 ug | PREFILLED_SYRINGE | Freq: Once | INTRAMUSCULAR | Status: AC
  Administered 2017-08-03: 300 ug via SUBCUTANEOUS
  Filled 2017-08-03: qty 0.6

## 2017-08-03 NOTE — Progress Notes (Signed)
Hematology and Oncology Follow Up Visit  CHANDLER SWIDERSKI [id number] 1930-02-09 83 y.o. 08/03/2017 3:05 PM [name], [name], DOReed, [name], DO   Principle Diagnosis: 83 year old gentleman with the following diagnoses:  1. Prostate cancer diagnosed in September 2017. He had a Gleason score 4+5 = 9 clinical stage T2c. his PSA was 15.9.  2. Multifactorial anemia: He has anemia of chronic disease and renal insufficiency diagnosed in March 2018.   Prior Therapy:   He is status post androgen deprivation therapy followed by radiation therapy completed in December 2017.  He is status post a bone marrow biopsy in April 2018 which did not show any myelodysplasia.  Current therapy: Aranesp 300 mcg every 4 weeks started in October 2018.  He will receive that to keep his hemoglobin above 11.  Interim History:  Mr. Falls presents today for a follow-up visit. Since the last visit, he reports feeling better after receiving packed red cell transfusion.  His energy and performance status has improved and denied any falls or syncope.  He denies any hematochezia or melena.  He denies any abdominal pain or discomfort.  He has no complications related to Aranesp that he received 4 weeks ago.  He does not report any headaches, blurry vision, syncope or seizures. He does not report any fevers, chills, sweats or weight loss. He isn't report any chest pain, palpitation, orthopnea or leg edema. He does not report any cough, wheezing or hemoptysis. He does not report any nausea, vomiting or abdominal pain. He does not report any frequency urgency or hesitancy. He does not report any hematuria or dysuria. He does not report any skeletal complaints. Remaining review of systems unremarkable.   Medications: I have reviewed the patient's current medications.  Current Outpatient Medications  Medication Sig Dispense Refill  . acyclovir (ZOVIRAX) 400 MG tablet Take 400 mg 5 (five) times daily by mouth.    Marland Kitchen aspirin 81 MG  tablet Take 81 mg by mouth daily.    Marland Kitchen atorvastatin (LIPITOR) 80 MG tablet Take 80 mg by mouth every morning. Take one daily for cholesterol    . bicalutamide (CASODEX) 50 MG tablet Take 50 mg by mouth daily.     . budesonide-formoterol (SYMBICORT) 160-4.5 MCG/ACT inhaler Take 2 puffs first thing in am and then another 2 puffs about 12 hours later. 1 Inhaler 11  . calcium citrate (CALCITRATE - DOSED IN MG ELEMENTAL CALCIUM) 950 MG tablet Take 200 mg of elemental calcium daily by mouth.    . levothyroxine (SYNTHROID, LEVOTHROID) 25 MCG tablet TAKE 1 TABLET(25 MCG) BY MOUTH DAILY BEFORE BREAKFAST 30 tablet 11  . metoprolol succinate (TOPROL-XL) 50 MG 24 hr tablet Take 50 mg by mouth every morning. Take one tablet daily for blood pressure    . Multiple Vitamins-Minerals (CENTRUM SILVER ADULT 50+) TABS Take 1 tablet by mouth every morning.    . tamsulosin (FLOMAX) 0.4 MG CAPS capsule Take 0.4 mg by mouth daily after breakfast. Reported on 11/14/2015     No current facility-administered medications for this visit.    Facility-Administered Medications Ordered in Other Visits  Medication Dose Route Frequency Provider Last Rate Last Dose  . Darbepoetin Alfa (ARANESP) injection 300 mcg  300 mcg Subcutaneous Once Wyatt Portela, MD         Allergies:  Allergies  Allergen Reactions  . Ketoconazole Other (See Comments)    Adverse skin reaction-- legs turned red and purple    Past Medical History, Surgical history, Social history, and  Family History were reviewed and updated.  Review of Systems:  Physical Exam: Blood pressure (!) 158/56, pulse 66, temperature 97.7 F (36.5 C), temperature source Oral, resp. rate 18, height _0  (1.702 m), weight 253 lb 11.2 oz (115.1 kg), SpO2 94 %. ECOG: 1 General appearance: Alert, awake gentleman without distress. Head: Normocephalic, without obvious abnormality no ulcers or lesions Neck: no adenopathy or neck masses. Lymph nodes: Cervical,  supraclavicular, and axillary nodes normal. Heart:regular rate and rhythm, S1, S2 normal, no murmur, click, rub or gallop Lung:chest clear, no wheezing, rales, normal symmetric air entry Abdomin: soft, non-tender, without masses or organomegaly no shifting dullness or ascites. EXT:no edema noted.   Lab Results: Lab Results  Component Value Date   WBC 8.5 08/03/2017   HGB 10.8 (L) 08/03/2017   HCT 33.8 (L) 08/03/2017   MCV 87.1 08/03/2017   PLT 265 08/03/2017     Chemistry      Component Value Date/Time   NA 142 12/21/2016 0931   NA 146 (H) 11/26/2016 1156   K 4.2 12/21/2016 0931   K 4.1 11/26/2016 1156   CL 108 12/21/2016 0931   CO2 26 12/21/2016 0931   CO2 27 11/26/2016 1156   BUN 38 (H) 12/21/2016 0931   BUN 32.3 (H) 11/26/2016 1156   CREATININE 1.56 (H) 12/21/2016 0931   CREATININE 1.8 (H) 11/26/2016 1156   GLU 100 11/19/2016      Component Value Date/Time   CALCIUM 9.4 12/21/2016 0931   CALCIUM 9.7 11/26/2016 1156   ALKPHOS 102 11/26/2016 1156   AST 20 11/26/2016 1156   ALT 19 11/26/2016 1156   BILITOT 0.52 11/26/2016 1156       Impression and Plan:  83 year old gentleman with the following issues:  1. Prostate cancer diagnosed in August 2017. He presented with a PSA of 15.9 and clinical stage TIIc. His prostate biopsy showed a Gleason score 4+5 = 9 with pattern 4+4 = 8 also noted. 11 out of 12 cores were involved with cancer. Staging workup did not show any evidence of metastatic disease and he has very little urinary tract symptoms at this time.  He completed definitive radiation therapy and currently completing androgen deprivation therapy for a total of 2 years.  2. Multifactorial anemia: he has anemia of renal insufficiency as well as chronic disease.  He is currently receiving Aranesp at 300 mcg every 4 weeks and repeat repeated transfusion as needed.  His hemoglobin is 10.8 and will receive Aranesp to keep his hemoglobin above 11.   We will continue  to check his iron periodically and replace as needed.  We discussed the role of intravenous iron if needed to in the future.    3. Follow-up: Will be in one month to receive Aranesp and in 2 months for a visit.   Zola Button, MD 11/13/20183:05 PM

## 2017-08-03 NOTE — Patient Instructions (Signed)

## 2017-08-03 NOTE — Progress Notes (Signed)
Give Aranesp 300 today with Hemoglobin of 10.8 per Dr. Alen Blew

## 2017-08-03 NOTE — Telephone Encounter (Signed)
Gave avs and declined calendar

## 2017-08-04 ENCOUNTER — Other Ambulatory Visit: Payer: Medicare Other

## 2017-08-04 ENCOUNTER — Ambulatory Visit: Payer: Medicare Other

## 2017-08-04 ENCOUNTER — Ambulatory Visit: Payer: Medicare Other | Admitting: Oncology

## 2017-08-11 ENCOUNTER — Non-Acute Institutional Stay: Payer: Medicare Other | Admitting: Internal Medicine

## 2017-08-11 ENCOUNTER — Encounter: Payer: Self-pay | Admitting: Internal Medicine

## 2017-08-11 VITALS — BP 160/80 | HR 66 | Temp 97.9°F | Wt 255.0 lb

## 2017-08-11 DIAGNOSIS — I10 Essential (primary) hypertension: Secondary | ICD-10-CM | POA: Diagnosis not present

## 2017-08-11 DIAGNOSIS — Z79899 Other long term (current) drug therapy: Secondary | ICD-10-CM | POA: Insufficient documentation

## 2017-08-11 DIAGNOSIS — J449 Chronic obstructive pulmonary disease, unspecified: Secondary | ICD-10-CM | POA: Diagnosis not present

## 2017-08-11 DIAGNOSIS — D508 Other iron deficiency anemias: Secondary | ICD-10-CM | POA: Diagnosis not present

## 2017-08-11 DIAGNOSIS — D631 Anemia in chronic kidney disease: Secondary | ICD-10-CM

## 2017-08-11 DIAGNOSIS — R269 Unspecified abnormalities of gait and mobility: Secondary | ICD-10-CM

## 2017-08-11 DIAGNOSIS — M7062 Trochanteric bursitis, left hip: Secondary | ICD-10-CM | POA: Diagnosis not present

## 2017-08-11 DIAGNOSIS — N183 Chronic kidney disease, stage 3 unspecified: Secondary | ICD-10-CM

## 2017-08-11 DIAGNOSIS — C61 Malignant neoplasm of prostate: Secondary | ICD-10-CM | POA: Diagnosis not present

## 2017-08-11 DIAGNOSIS — E7800 Pure hypercholesterolemia, unspecified: Secondary | ICD-10-CM | POA: Diagnosis not present

## 2017-08-11 DIAGNOSIS — J4489 Other specified chronic obstructive pulmonary disease: Secondary | ICD-10-CM

## 2017-08-11 DIAGNOSIS — Z23 Encounter for immunization: Secondary | ICD-10-CM | POA: Diagnosis not present

## 2017-08-11 DIAGNOSIS — E039 Hypothyroidism, unspecified: Secondary | ICD-10-CM | POA: Diagnosis not present

## 2017-08-11 MED ORDER — ZOSTER VAC RECOMB ADJUVANTED 50 MCG/0.5ML IM SUSR: 0.5000 mL | Freq: Once | INTRAMUSCULAR | 1 refills | Status: AC

## 2017-08-11 NOTE — Progress Notes (Signed)
Location:  Occupational psychologist of Service:  Clinic (12)  Provider: Leiyah Maultsby L. Mariea Clonts, D.O., C.M.D.  Code Status: DNR Goals of Care:  Advanced Directives 08/11/2017  Does Patient Have a Medical Advance Directive? Yes  Type of Advance Directive [place]  Does patient want to make changes to medical advance directive? No - Patient declined  Copy of [place] in Chart? Yes  Would patient like information on creating a medical advance directive? -   Chief Complaint  Patient presents with  . Follow-up    discuss medical issues    HPI: Patient is a 83 y.o. male seen today for medical management of chronic diseases.    I haven't seen him since March of this year.  He travels to [place] in the summertime and then returns to [place] in fall.    Says he finished XRT before that last visit.  Side effects have gone away.  PSA is low now at 0.1 or so.    Has been fighting anemia.  Switched from iron pills to shots--on aranesp.  Last hgb 10.8.  Schedule in 4 wks for next shot as long as under 11.  Says it's not clear where the blood loss is going.  Cscope was negative last summer for source (Dr. Sanjuan Dame in [place]).  He been woozy a fair amount when getting up in am.  Less fatigued.  Also related to stage 3 CKD.  Last creatinine here was 1.56.    Sees Dr. Melvyn Novas also.  Thought himself that it was from deconditioning "being fat and lazy" per pt.  Using a different inhaler for his breathing.  Has f/u again in next couple of weeks to have f/u PFTs. Not as severely out of breath now.    During the summer, he had Mohs surgery on his left cheek. Another area left posterior forearm also had cancer and he's having another surgery coming up there.    Arthritis--floating per pt.  Right ankle was bothering him and orthopedist said it was tendonitis.  Sometimes lower back bothers him. Now left upper buttock/hip. Had a massage and advised to sleep  with a pillow b/w his legs, but only had mixed success last night.  Drinking quinine water before bed and cramps went away.  They have not returned since stopping it.   He saw a lady and a Clinical biochemist for several hours.  He has a lengthy report that he's by and large ok.  Neuropsych testing.     Needs pneumovax.    Has not been able to get shingrix yet due to availability.  Wants to get now at [facility].   Past Medical History:  Diagnosis Date  . At risk for sleep apnea    STOP-BANG=5      SENT TO PCP 09-25-2014  . Bilateral lower extremity edema   . COPD mixed type [facility]) pulmologist-  dr Reginia Naas [facility])  note in epic under Care Everywhere tab)   per PFTs: 10/10/2013: Evidence of moderate obstructive lung disease with small airway involvement (asthma); FEV1 54% predicted, FEV1/FVC <70%, FEF25-75% is reduced. Mild response to bronchodilator; FEV1 w/ 8% change after bronchodilator.    -- and  mild intermittant asthma  . Coronary atherosclerosis of native coronary artery    cardiologist-  dr schwengel  in [place], [place] (where pt lives during spring and summer)  . Eczema   . Elevated prostate specific antigen (PSA) 10/10/2006  . First degree  heart block   . Frequency of urination   . GERD (gastroesophageal reflux disease)   . Herpes simplex of eye    bilateral eye-  takes acyclovir daily  . History of kidney stones   . History of MI (myocardial infarction)    2001--  s/p  PCI and stent  . Hyperlipidemia   . Hypertension   . Hypothyroidism   . Mild intermittent asthma   . Nephrolithiasis    right  . OA (osteoarthritis)   . Prostate cancer ([place])   . Rheumatoid arthritis ([place]) 05/2013  . Right ureteral stone   . Urgency of urination   . Wears glasses   . Wears hearing aid    bilateral    Past Surgical History:  Procedure Laterality Date  . CATARACT EXTRACTION W/ INTRAOCULAR LENS  IMPLANT, BILATERAL Bilateral 2014  . COLONOSCOPY  2010  . CORONARY ANGIOPLASTY WITH STENT  PLACEMENT  2001   ([address] [place])   PCI and stenting  . CYSTOSCOPY WITH RETROGRADE PYELOGRAM, URETEROSCOPY AND STENT PLACEMENT Right 09/30/2015   Procedure: RIGHT URETEROSCOPY, RETROGRADE PYELOGRAM, LASER LITHOTRIPSY AND STENT PLACEMENT;  Surgeon: Kathie Rhodes, MD;  Location: [place];  Service: Urology;  Laterality: Right;  . CYSTOSCOPY/URETEROSCOPY/HOLMIUM LASER/STENT PLACEMENT Right 11/04/2015   Procedure: RIGHT URETEROSCOPY/RETROGRADE PYELOGRAM/HOLMIUM LASER LITHOTRIPSY/STENT PLACEMENT;  Surgeon: Kathie Rhodes, MD;  Location: [facility];  Service: Urology;  Laterality: Right;  . PILONIDAL CYST EXCISION  2006  . TONSILLECTOMY  as child  . TOTAL HIP ARTHROPLASTY Left 2001    Allergies  Allergen Reactions  . Ketoconazole Other (See Comments)    Adverse skin reaction-- legs turned red and purple    Outpatient Encounter Medications as of 08/11/2017  Medication Sig  . acyclovir (ZOVIRAX) 400 MG tablet Take 400 mg 5 (five) times daily by mouth.  Marland Kitchen aspirin 81 MG tablet Take 81 mg by mouth daily.  Marland Kitchen atorvastatin (LIPITOR) 80 MG tablet Take 80 mg by mouth every morning. Take one daily for cholesterol  . bicalutamide (CASODEX) 50 MG tablet Take 50 mg by mouth daily.   . budesonide-formoterol (SYMBICORT) 160-4.5 MCG/ACT inhaler Take 2 puffs first thing in am and then another 2 puffs about 12 hours later.  . calcium citrate (CALCITRATE - DOSED IN MG ELEMENTAL CALCIUM) 950 MG tablet Take 200 mg of elemental calcium daily by mouth.  . levothyroxine (SYNTHROID, LEVOTHROID) 25 MCG tablet TAKE 1 TABLET(25 MCG) BY MOUTH DAILY BEFORE BREAKFAST  . metoprolol succinate (TOPROL-XL) 50 MG 24 hr tablet Take 50 mg by mouth every morning. Take one tablet daily for blood pressure  . Multiple Vitamins-Minerals (CENTRUM SILVER ADULT 50+) TABS Take 1 tablet by mouth every morning.  . tamsulosin (FLOMAX) 0.4 MG CAPS capsule Take 0.4 mg by mouth daily after breakfast. Reported on 11/14/2015    No facility-administered encounter medications on file as of 08/11/2017.     Review of Systems:  Review of Systems  Constitutional: Positive for malaise/fatigue. Negative for chills and fever.       Not as bad as it had been  HENT: Negative for congestion and hearing loss.   Eyes: Negative for blurred vision.       Wears glasses for driving and for very small print only  Respiratory: Positive for shortness of breath. Negative for cough and wheezing.   Cardiovascular: Negative for chest pain, palpitations and leg swelling.  Gastrointestinal: Positive for constipation. Negative for abdominal pain, blood in stool, diarrhea and melena.  Genitourinary: Negative  for dysuria.  Musculoskeletal: Positive for back pain and joint pain. Negative for falls.       More reliant on walker, left hip pain  Skin: Negative for itching and rash.  Neurological: Negative for dizziness, loss of consciousness and weakness.       Dizziness better with anemia improvement  Psychiatric/Behavioral: Positive for memory loss. Negative for depression. The patient is not nervous/anxious and does not have insomnia.        Reports memory loss, but had neuropsych testing and apparently no significant abnormalities by his report    Health Maintenance  Topic Date Due  . PNA vac Low Risk Adult (2 of 2 - PPSV23) 04/19/2011  . TETANUS/TDAP  03/20/2025  . INFLUENZA VACCINE  Completed    Physical Exam: Vitals:   08/11/17 1058  BP: (!) 160/80  Pulse: 66  Temp: 97.9 F (36.6 C)  TempSrc: Oral  SpO2: 98%  Weight: 255 lb (115.7 kg)   Body mass index is 39.94 kg/m. Physical Exam  Constitutional: He is oriented to person, place, and time. He appears well-developed. No distress.  Obese white male  HENT:  Head: Normocephalic and atraumatic.  Left Ear: External ear normal.  Nose: Nose normal.  Mouth/Throat: Oropharynx is clear and moist. No oropharyngeal exudate.  Eyes: Conjunctivae and EOM are normal. Pupils are  equal, round, and reactive to light.  Neck: Normal range of motion. Neck supple.  Cardiovascular: Normal rate, regular rhythm, normal heart sounds and intact distal pulses.  Stasis derm of ankles  Pulmonary/Chest: Effort normal and breath sounds normal. He has no wheezes. He has no rales.  Dyspneic on exertion  Abdominal: Soft. Bowel sounds are normal.  Musculoskeletal: Normal range of motion. He exhibits tenderness.  Over bursa of left hip; ambulates with rollator walker; right foot turns outward with deformity of ankle  Lymphadenopathy:    He has no cervical adenopathy.  Neurological: He is alert and oriented to person, place, and time. No cranial nerve deficit.  Skin: Skin is warm and dry.  Some ecchymoses of arms and hands; raised hornlike papule on left forearm (apparently malignant and has plans for surgery)  Psychiatric: He has a normal mood and affect. His behavior is normal. Judgment and thought content normal.    Labs reviewed: Basic Metabolic Panel: Recent Labs    08/27/16 1633 11/19/16 11/26/16 1156 12/21/16 0931  NA 143 144 146* 142  K 4.4 4.8 4.1 4.2  CL 108  --   --  108  CO2 27  --  27 26  GLUCOSE 106*  --  97 109*  BUN 28* 33* 32.3* 38*  CREATININE 1.65* 1.4* 1.8* 1.56*  CALCIUM 9.1  --  9.7 9.4   Liver Function Tests: Recent Labs    08/27/16 1633 11/19/16 11/26/16 1156 11/26/16 1156  AST 20 20 20   --   ALT 21 21 19   --   ALKPHOS 86 106 102  --   BILITOT 0.6  --  0.52  --   PROT 5.8*  --  6.7 5.8*  ALBUMIN 3.7  --  3.2*  --    No results for input(s): LIPASE, AMYLASE in the last 8760 hours. No results for input(s): AMMONIA in the last 8760 hours. CBC: Recent Labs    12/21/16 0931 07/06/17 1038 08/03/17 1433  WBC 9.0 8.1 8.5  NEUTROABS 7.2 6.5 6.9*  HGB 9.7* 8.2* 10.8*  HCT 30.7* 25.4* 33.8*  MCV 89.8 88.4 87.1  PLT 301 271 265  Lipid Panel: Recent Labs    11/19/16  CHOL 157  HDL 76*  LDLCALC 67  TRIG 64   Lab Results  Component  Value Date   HGBA1C 5.2 11/19/2016    Assessment/Plan 1. Greater trochanteric bursitis, left -discussed that this is typically treated with a steroid injection at orthopedics, but he does not want this now--will monitor for a few weeks, and if not better, then he will let me know  2. Essential hypertension -bp elevated here, but he reports he must have been stressed about seeing me b/c it typically runs in the 130s, continues on metoprolol succinate 50mg  each morning  3. Chronic obstructive airway disease with asthma ([place]) -continue symbicort as per Dr.Wert--reports tremendous improvement, remains severely deconditioned, keep f/u as planned for recheck of PFTs  4. Hypothyroidism, unspecified type -continue levothyroxine, wants tsh rechecked--does not seem to believe he needed this medication despite lab findings  5. Anemia in stage 3 chronic kidney disease ([place]) -see #6, combination of two types of anemia  6. Other iron deficiency anemia -continues on darbopoeitin (aranesp) shots through hematology, Dr. Alen Blew if hgb less than 11, symptoms much improved since treatment, was not responding much to oral iron  7. Morbid obesity due to excess calories ([place]) /complicated by hbp/ hyperlidemia -ongoing, wt has actually trended up not down over the year  8. Gait disorder -cont use of rollator walker for balance, does not want to do any exercise or therapy  9. Pure hypercholesterolemia -continues on atorvastatin though benefit doubtful at 30 with chronic blood loss anemia, prostate cancer and simply age since 5-10 years needed for benefit  10. Malignant neoplasm of prostate ([place]) -diagnosed 9/17, Gleason 4+5 = 9, clinical stage T2c, pSA was 15.9, now very low by his report--need [facility]  11. Need for pneumococcal vaccine - Pneumococcal polysaccharide vaccine 23-valent greater than or equal to 2yo subcutaneous/IM  12.  Medication Mgt Requests reduction of pill  burden Future bone density to possibly stop calcium after he's completed his hormone therapy for his prostate cancer Is flomax needed he wonders since he's had the hormonal therapy that's shrunken his prostate and urinary symptoms are better--advised to ask urologist, Dr. Jeffie Pollock Discussed lipitor not being so necessary at his age, but cardiologist told him to keep taking it (in [place]) Check tsh and fasting lipids to determine need for levothyroxine and atorvastatin  Labs/tests ordered:  Tsh, FLP (wants to get at oncology lab appt next month--Rx was given) Next appt:  F/u in March before returning to [place]. Viki Carrera, D.O. [facility] [address], [place] [address] Cell Phone (Mon-Fri 8am-5pm):  [phone] On Call:  [phone] & follow prompts after 5pm & weekends Office Phone:  [phone] Office Fax:  [phone]

## 2017-08-16 ENCOUNTER — Telehealth: Payer: Self-pay | Admitting: *Deleted

## 2017-08-16 DIAGNOSIS — M7062 Trochanteric bursitis, left hip: Secondary | ICD-10-CM

## 2017-08-16 NOTE — Telephone Encounter (Signed)
Pt calling asking that a referral be put in for a steroid injection as discussed at his visit on Wednesday 08/11/17. Please advise

## 2017-08-16 NOTE — Telephone Encounter (Signed)
Glad he decided to go ahead with referral--orthopedics referral has been placed.  Please inform him. Nick Stults L. Ramla Hase, D.O. [facility] [address], [place] [address] Cell Phone (Mon-Fri 8am-5pm):  [phone] On Call:  [phone] & follow prompts after 5pm & weekends Office Phone:  [phone] Office Fax:  [phone]

## 2017-08-23 IMAGING — CT CT BIOPSY
1 of 2 series · 15 of 31 positions shown, 19 images · non-contrast
Comparison: none

INDICATION: 86-year-old with multifactorial anemia.  History of prostate cancer.

[Series 3: i-spiral 5.0 b40f · axial · 0.59mm/px · z∈[+1284,+1364]mm · 15 of 27 slices shown, 19 images]
[im 2/27  mediastinal]
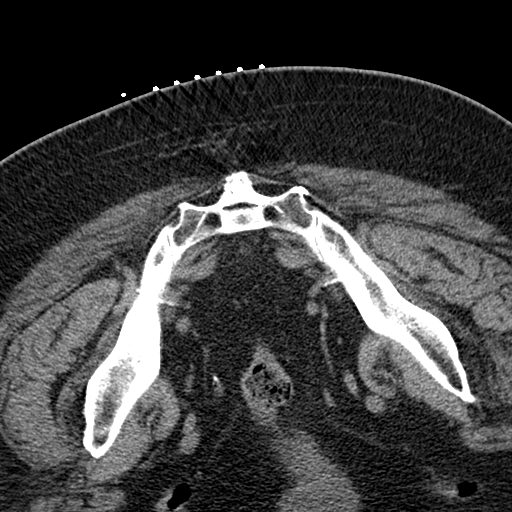
[im 2/27  lung]
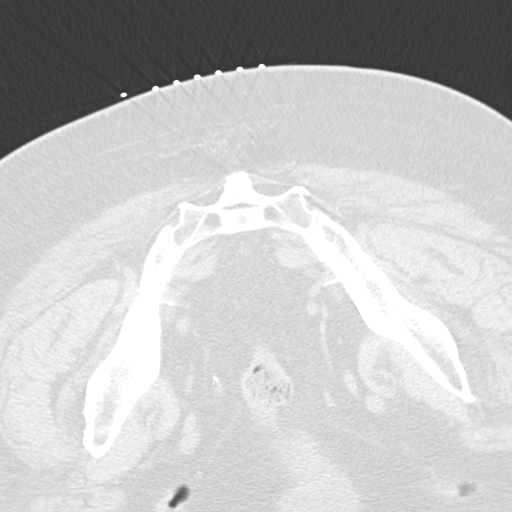
[im 4/27  lung]
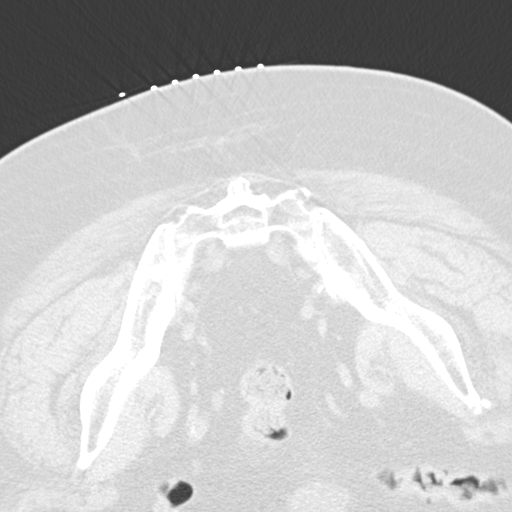
[im 7/27  lung]
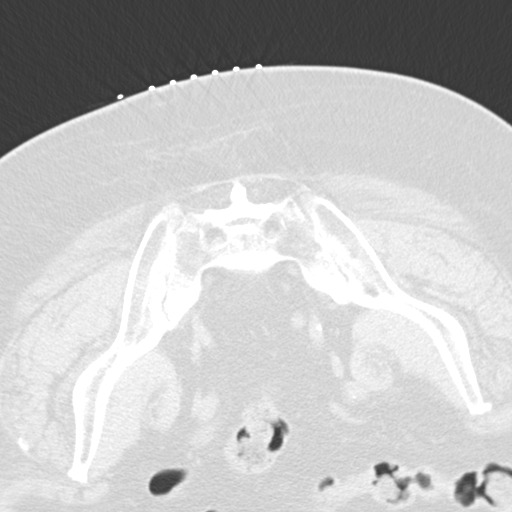
[im 8/27  lung]
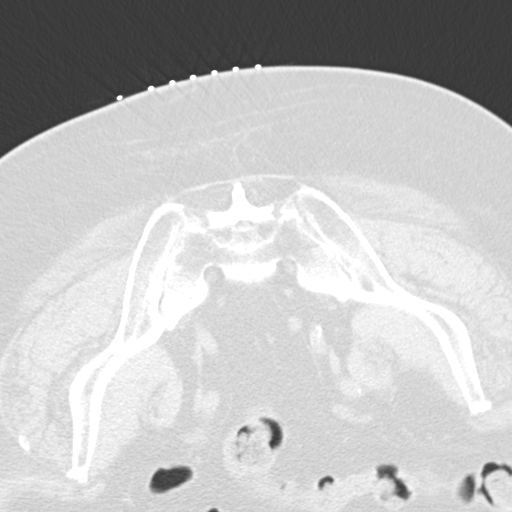
[im 9/27  mediastinal]
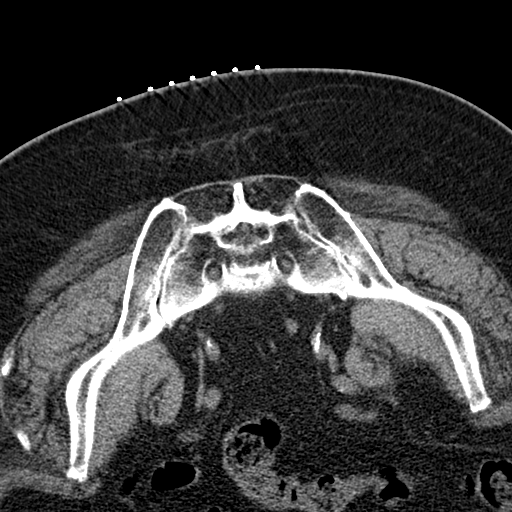
[im 9/27  lung]
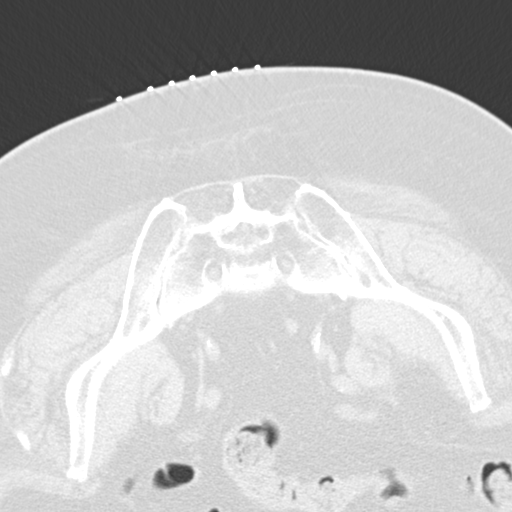
[im 10/27  lung]
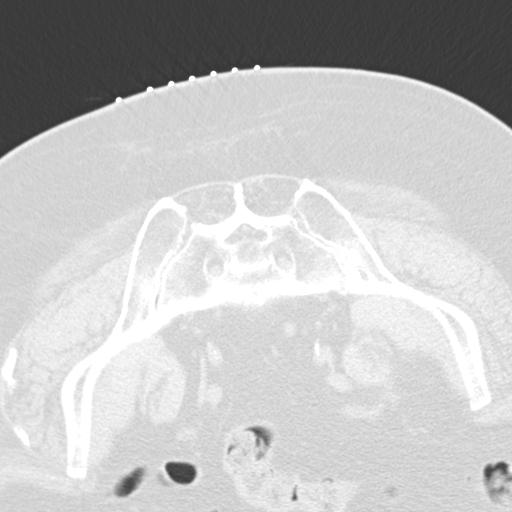
[im 12/27  lung]
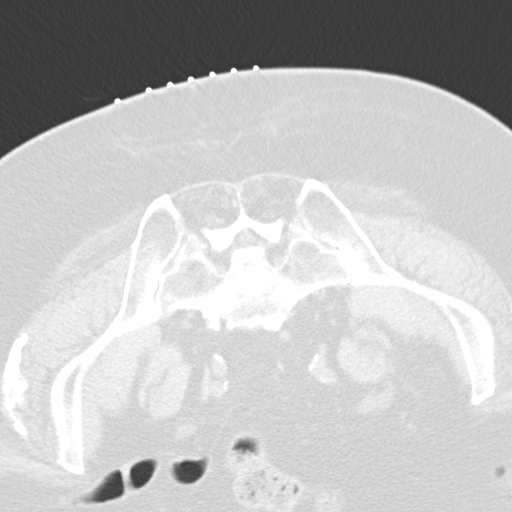
[im 13/27  lung]
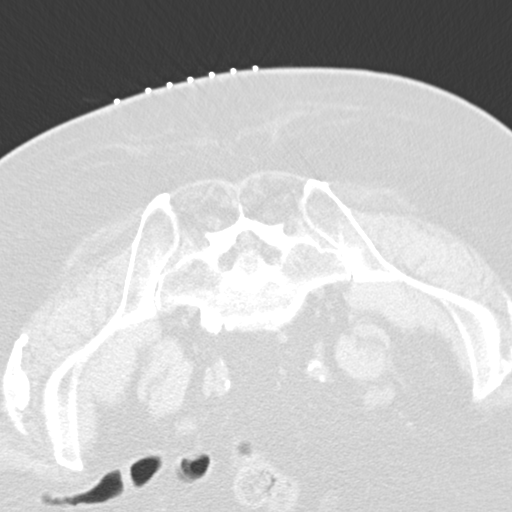
[im 14/27  mediastinal]
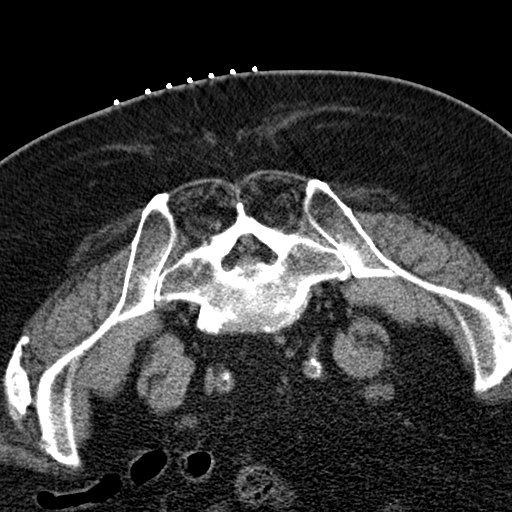
[im 14/27  lung]
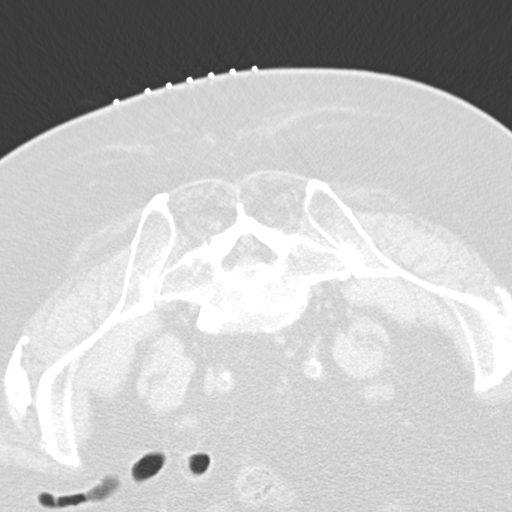
[im 17/27  lung]
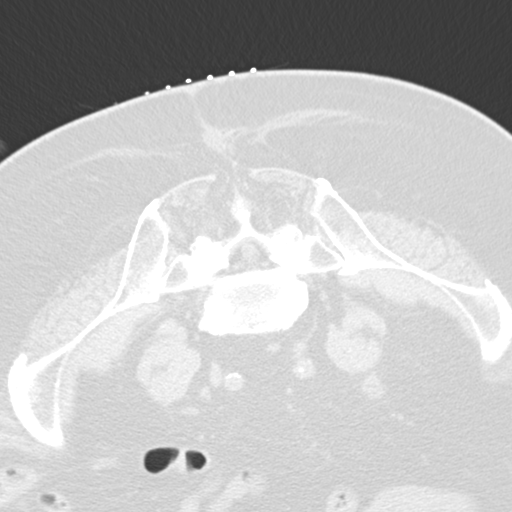
[im 18/27  lung]
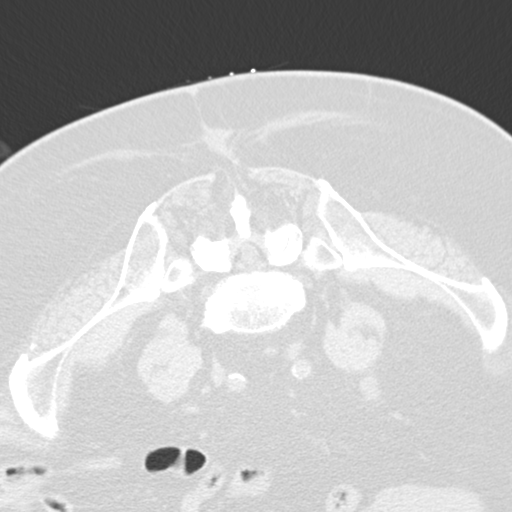
[im 19/27  lung]
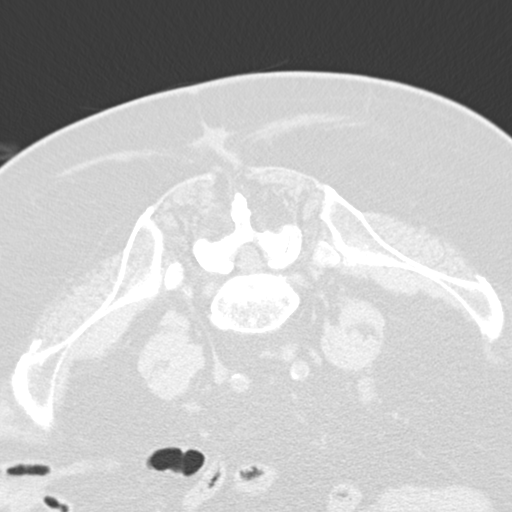
[im 20/27  mediastinal]
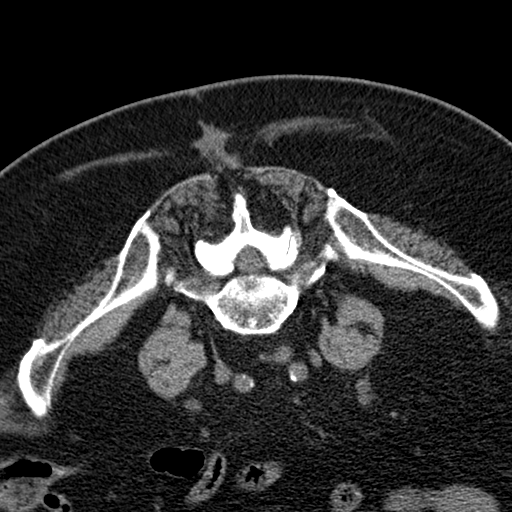
[im 20/27  lung]
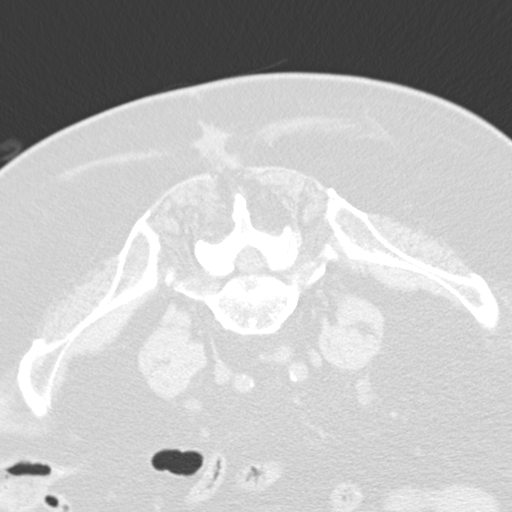
[im 23/27  lung]
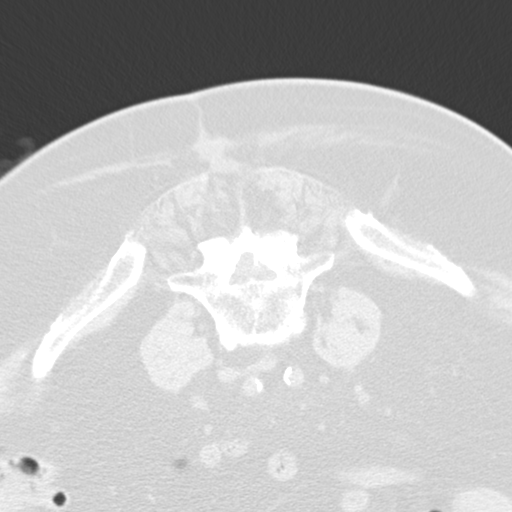
[im 25/27  lung]
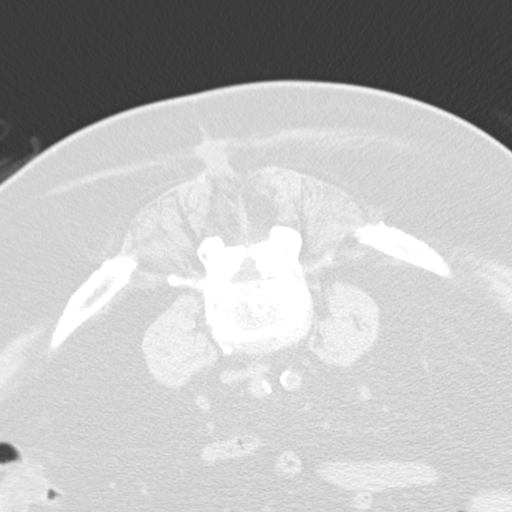

[15 of 31 positions shown; findings below may reference images not displayed]

EXAM:
CT GUIDED BONE MARROW ASPIRATES AND BIOPSY

MEDICATIONS:
None.

ANESTHESIA/SEDATION:
Fentanyl 50 mcg IV; Versed 1.0 mg IV

Moderate Sedation Time:  15 minutes

The patient was continuously monitored during the procedure by the
interventional radiology nurse under my direct supervision.

COMPLICATIONS:
None immediate.

PROCEDURE:
The procedure was explained to the patient. The risks and benefits
of the procedure were discussed and the patient's questions were
addressed. Informed consent was obtained from the patient. The
patient was placed prone on CT scan. Images of the pelvis were
obtained. The right side of back was prepped and draped in sterile
fashion. The skin and right posterior iliac bone were anesthetized
with 1% lidocaine. 11 gauge bone needle was directed into the right
iliac bone with CT guidance. Two aspirates and two core biopsies
were obtained. Bandage placed over the puncture site.
FINDINGS: Bone needle directed in the posterior right ilium.
IMPRESSION: CT guided bone marrow aspirates and core biopsy.

## 2017-08-24 ENCOUNTER — Ambulatory Visit (INDEPENDENT_AMBULATORY_CARE_PROVIDER_SITE_OTHER): Payer: Medicare Other | Admitting: Orthopaedic Surgery

## 2017-08-24 ENCOUNTER — Encounter (INDEPENDENT_AMBULATORY_CARE_PROVIDER_SITE_OTHER): Payer: Self-pay | Admitting: Orthopaedic Surgery

## 2017-08-24 ENCOUNTER — Ambulatory Visit (INDEPENDENT_AMBULATORY_CARE_PROVIDER_SITE_OTHER): Payer: Medicare Other

## 2017-08-24 VITALS — Ht 67.0 in | Wt 255.0 lb

## 2017-08-24 DIAGNOSIS — M25552 Pain in left hip: Secondary | ICD-10-CM

## 2017-08-24 NOTE — Progress Notes (Signed)
Office Visit Note   Patient: Kevin Kane           Date of Birth: 1929/09/26           MRN: [id number] Visit Date: 08/24/2017              Requested by: Gayland Curry, DO [address] [place], [place] [address] PCP: Gayland Curry, DO   Assessment & Plan: Visit Diagnoses:  1. Pain in left hip     Plan: Impression is left SI joint dysfunction.  We will try an injection with Dr. Ernestina Patches to see if this will give him any relief.  If not we will need to consider MRI of his lumbar spine.  Follow-Up Instructions: Return if symptoms worsen or fail to improve.   Orders:  Orders Placed This Encounter  Procedures  . XR HIP UNILAT W OR W/O PELVIS 2-3 VIEWS LEFT  . Ambulatory referral to Physical Medicine Rehab   No orders of the defined types were placed in this encounter.     Procedures: No procedures performed   Clinical Data: No additional findings.   Subjective: Chief Complaint  Patient presents with  . Left Hip - Pain    20 years ago hx of total hip replacement     Patient is a 83 year old gentleman comes in with left hip pain.  He denies any radicular symptoms or groin pain.  The pain is worse with weightbearing.  He localizes this pain to the left SI joint region in the posterior iliac wing.  He denies any lateral hip pain.  Denies any numbness or tingling.    Review of Systems  Constitutional: Negative.   All other systems reviewed and are negative.    Objective: Vital Signs: Ht 5\' 7"  (1.702 m)   Wt 255 lb (115.7 kg)   BMI 39.94 kg/m   Physical Exam  Constitutional: He is oriented to person, place, and time. He appears well-developed and well-nourished.  HENT:  Head: Normocephalic and atraumatic.  Eyes: Pupils are equal, round, and reactive to light.  Neck: Neck supple.  Pulmonary/Chest: Effort normal.  Abdominal: Soft.  Musculoskeletal: Normal range of motion.  Neurological: He is alert and oriented to person, place, and time.  Skin: Skin is  warm.  Psychiatric: He has a normal mood and affect. His behavior is normal. Judgment and thought content normal.  Nursing note and vitals reviewed.   Ortho Exam Left hip exam shows painless rotation of the hip.  He is not able to walk far due to the pain in the left SI region.  Lateral hip is nontender to palpation.  Negative straight leg Specialty Comments:  No specialty comments available.  Imaging: Xr Hip Unilat W Or W/o Pelvis 2-3 Views Left  Result Date: 08/24/2017 Stable THA with HO    PMFS History: Patient Active Problem List   Diagnosis Date Noted  . Medication management 08/11/2017  . Anemia in chronic kidney disease 07/06/2017  . Moderate persistent asthma, uncomplicated 32/99/2426  . Malignant neoplasm of prostate ([place]) 06/29/2016  . Anemia, iron deficiency 12/25/2015  . Leukocytosis 12/25/2015  . Left hip pain 12/25/2015  . Ureteral calculus, right 09/30/2015  . Lumbago 12/04/2013  . Venous stasis dermatitis 12/04/2013  . Hypothyroidism 12/04/2013  . Chronic obstructive airway disease with asthma ([place]) 11/15/2013  . Hypoxia 10/09/2013  . Rheumatoid arthritis ([place])   . Edema 10/07/2012  . Gait disorder 10/07/2012  . Eczema 10/07/2012  . Morbid obesity due to  excess calories ([place]) /complicated by hbp/ hyperlidemia 10/05/2012  . Herpes simplex with unspecified ophthalmic complication 62/83/1517  . Hyperlipemia 10/04/2012  . Essential hypertension 10/04/2012   Past Medical History:  Diagnosis Date  . At risk for sleep apnea    STOP-BANG=5      SENT TO PCP 09-25-2014  . Bilateral lower extremity edema   . COPD mixed type [facility]) pulmologist-  dr Reginia Naas [facility])  note in epic under Care Everywhere tab)   per PFTs: 10/10/2013: Evidence of moderate obstructive lung disease with small airway involvement (asthma); FEV1 54% predicted, FEV1/FVC <70%, FEF25-75% is reduced. Mild response to bronchodilator; FEV1 w/ 8% change after bronchodilator.    -- and  mild  intermittant asthma  . Coronary atherosclerosis of native coronary artery    cardiologist-  dr schwengel  in [place], [place] (where pt lives during spring and summer)  . Eczema   . Elevated prostate specific antigen (PSA) 10/10/2006  . First degree heart block   . Frequency of urination   . GERD (gastroesophageal reflux disease)   . Herpes simplex of eye    bilateral eye-  takes acyclovir daily  . History of kidney stones   . History of MI (myocardial infarction)    2001--  s/p  PCI and stent  . Hyperlipidemia   . Hypertension   . Hypothyroidism   . Mild intermittent asthma   . Nephrolithiasis    right  . OA (osteoarthritis)   . Prostate cancer ([place])   . Rheumatoid arthritis ([place]) 05/2013  . Right ureteral stone   . Urgency of urination   . Wears glasses   . Wears hearing aid    bilateral    Family History  Problem Relation Age of Onset  . Stroke Mother   . Heart disease Father   . Cancer Neg Hx     Past Surgical History:  Procedure Laterality Date  . CATARACT EXTRACTION W/ INTRAOCULAR LENS  IMPLANT, BILATERAL Bilateral 2014  . COLONOSCOPY  2010  . CORONARY ANGIOPLASTY WITH STENT PLACEMENT  2001   ([address], [place])   PCI and stenting  . CYSTOSCOPY WITH RETROGRADE PYELOGRAM, URETEROSCOPY AND STENT PLACEMENT Right 09/30/2015   Procedure: RIGHT URETEROSCOPY, RETROGRADE PYELOGRAM, LASER LITHOTRIPSY AND STENT PLACEMENT;  Surgeon: Kathie Rhodes, MD;  Location: [place];  Service: Urology;  Laterality: Right;  . CYSTOSCOPY/URETEROSCOPY/HOLMIUM LASER/STENT PLACEMENT Right 11/04/2015   Procedure: RIGHT URETEROSCOPY/RETROGRADE PYELOGRAM/HOLMIUM LASER LITHOTRIPSY/STENT PLACEMENT;  Surgeon: Kathie Rhodes, MD;  Location: [facility];  Service: Urology;  Laterality: Right;  . PILONIDAL CYST EXCISION  2006  . TONSILLECTOMY  as child  . TOTAL HIP ARTHROPLASTY Left 2001   Social History   Occupational History  . Occupation: retired Programme researcher, broadcasting/film/video   Tobacco Use  .  Smoking status: Former Smoker    Packs/day: 1.00    Years: 12.00    Pack years: 12.00    Types: Cigarettes    Last attempt to quit: 09/21/1958    Years since quitting: 58.9  . Smokeless tobacco: Never Used  Substance and Sexual Activity  . Alcohol use: Yes    Alcohol/week: 3.0 oz    Types: 5 Glasses of wine per week  . Drug use: No  . Sexual activity: Not Currently

## 2017-08-26 ENCOUNTER — Encounter: Payer: Self-pay | Admitting: Internal Medicine

## 2017-08-26 ENCOUNTER — Ambulatory Visit (INDEPENDENT_AMBULATORY_CARE_PROVIDER_SITE_OTHER): Payer: Medicare Other | Admitting: Internal Medicine

## 2017-08-26 ENCOUNTER — Other Ambulatory Visit (INDEPENDENT_AMBULATORY_CARE_PROVIDER_SITE_OTHER): Payer: Self-pay

## 2017-08-26 ENCOUNTER — Telehealth (INDEPENDENT_AMBULATORY_CARE_PROVIDER_SITE_OTHER): Payer: Self-pay | Admitting: Physical Medicine and Rehabilitation

## 2017-08-26 VITALS — BP 134/76 | HR 69 | Ht 68.0 in | Wt 252.0 lb

## 2017-08-26 DIAGNOSIS — J4540 Moderate persistent asthma, uncomplicated: Secondary | ICD-10-CM | POA: Diagnosis not present

## 2017-08-26 DIAGNOSIS — D487 Neoplasm of uncertain behavior of other specified sites: Secondary | ICD-10-CM | POA: Diagnosis not present

## 2017-08-26 DIAGNOSIS — C44629 Squamous cell carcinoma of skin of left upper limb, including shoulder: Secondary | ICD-10-CM | POA: Diagnosis not present

## 2017-08-26 LAB — PULMONARY FUNCTION TEST
DL/VA % PRED: 54 %
DL/VA: 2.43 ml/min/mmHg/L
DLCO COR % PRED: 39 %
DLCO UNC % PRED: 34 %
DLCO UNC: 10.27 ml/min/mmHg
DLCO cor: 11.82 ml/min/mmHg
FEF 25-75 PRE: 0.78 L/s
FEF 25-75 Post: 1.29 L/sec
FEF2575-%Change-Post: 65 %
FEF2575-%Pred-Post: 83 %
FEF2575-%Pred-Pre: 50 %
FEV1-%Change-Post: 15 %
FEV1-%Pred-Post: 74 %
FEV1-%Pred-Pre: 64 %
FEV1-POST: 1.8 L
FEV1-Pre: 1.56 L
FEV1FVC-%Change-Post: 4 %
FEV1FVC-%Pred-Pre: 91 %
FEV6-%CHANGE-POST: 10 %
FEV6-%PRED-POST: 83 %
FEV6-%PRED-PRE: 75 %
FEV6-PRE: 2.41 L
FEV6-Post: 2.68 L
FEV6FVC-%CHANGE-POST: 0 %
FEV6FVC-%PRED-POST: 107 %
FEV6FVC-%PRED-PRE: 107 %
FVC-%CHANGE-POST: 11 %
FVC-%PRED-POST: 78 %
FVC-%Pred-Pre: 70 %
FVC-Post: 2.72 L
FVC-Pre: 2.44 L
POST FEV6/FVC RATIO: 99 %
Post FEV1/FVC ratio: 66 %
Pre FEV1/FVC ratio: 64 %
Pre FEV6/FVC Ratio: 99 %
RV % PRED: 109 %
RV: 2.9 L
TLC % pred: 81 %
TLC: 5.46 L

## 2017-08-26 MED ORDER — BUDESONIDE-FORMOTEROL FUMARATE 160-4.5 MCG/ACT IN AERO: 2.0000 | INHALATION_SPRAY | Freq: Two times a day (BID) | RESPIRATORY_TRACT | 0 refills | Status: DC

## 2017-08-26 MED ORDER — TRAMADOL HCL 50 MG PO TABS: 50.0000 mg | ORAL_TABLET | Freq: Every day | ORAL | 0 refills | Status: DC | PRN

## 2017-08-26 NOTE — Telephone Encounter (Signed)
Please advise 

## 2017-08-26 NOTE — Progress Notes (Signed)
Subjective:    Patient ID: Kevin Kane, male   DOB: 1930-03-11,    MRN: [id number]      Brief patient profile:  89 yowm went to [place] smoking in 1960 s obvious sequelae self referred to pulmonary clinic  07/01/2017 cc sob with evidence of asthma on pfts from [place]  From 06/02/17 and over use of saba chronically    History of Present Illness  07/01/2017 1st [facility] office visit/ Adelle Zachar   Chief Complaint  Patient presents with  . Pulmonary Consult    Self referral. Pt c/o SOB for "many years". He gets winded walking short distances such as from lobby to exam room today.   doe x 20 years improves with saba gradually worse x  Now 50 ft and using saba in multiple forms day >> noct Also anemia last tx early Sept since RT   For prostate ca Sleeps ok flat / some mild leg swelling/ hb but no cough  rec Plan A = Automatic = Symbicort 160 Take 2 puffs first thing in am and then another 2 puffs about 12 hours later.  Work on inhaler technique:   Plan B = Backup Only use your albuterol as a rescue medication Plan C = Crisis - only use your ipatropium- albuterol nebulizer if you first try Plan B and it fails to help > ok to use the nebulizer up to every 4 hours but if start needing it regularly call for immediate appointment      08/26/2017  f/u ov/Martika Egler re:  Moderate asthma vs GOLD II copd  Chief Complaint  Patient presents with  . Follow-up    PFT's done today. His breathing has improved some.    really Not limited by breathing from desired activities on symbicort 160  2bid /rare need for saba hfa only, no neb   No obvious day to day or daytime variability or assoc excess/ purulent sputum or mucus plugs or hemoptysis or cp or chest tightness, subjective wheeze or overt sinus or hb symptoms. No unusual exposure hx or h/o childhood pna/ asthma or knowledge of premature birth.  Sleeping ok flat without nocturnal  or early am exacerbation  of respiratory   c/o's or need for noct saba. Also denies any obvious fluctuation of symptoms with weather or environmental changes or other aggravating or alleviating factors except as outlined above   Current Allergies, Complete Past Medical History, Past Surgical History, Family History, and Social History were reviewed in [employer] record.  ROS  The following are not active complaints unless bolded Hoarseness, sore throat, dysphagia, dental problems, itching, sneezing,  nasal congestion or discharge of excess mucus or purulent secretions, ear ache,   fever, chills, sweats, unintended wt loss or wt gain, classically pleuritic or exertional cp,  orthopnea pnd or leg swelling, presyncope, palpitations, abdominal pain, anorexia, nausea, vomiting, diarrhea  or change in bowel habits or change in bladder habits, change in stools or change in urine, dysuria, hematuria,  rash, arthralgias, visual complaints, headache, numbness, weakness or ataxia or problems with walking or coordination,  change in mood/affect or memory.        Current Meds  Medication Sig  . acyclovir (ZOVIRAX) 400 MG tablet Take 400 mg 5 (five) times daily by mouth.  Marland Kitchen aspirin 81 MG tablet Take 81 mg by mouth daily.  Marland Kitchen atorvastatin (LIPITOR) 80 MG tablet Take 80 mg by mouth every morning. Take one daily for cholesterol  .  bicalutamide (CASODEX) 50 MG tablet Take 50 mg by mouth daily.   . budesonide-formoterol (SYMBICORT) 160-4.5 MCG/ACT inhaler Take 2 puffs first thing in am and then another 2 puffs about 12 hours later.  . calcium citrate (CALCITRATE - DOSED IN MG ELEMENTAL CALCIUM) 950 MG tablet Take 200 mg of elemental calcium daily by mouth.  . levothyroxine (SYNTHROID, LEVOTHROID) 25 MCG tablet TAKE 1 TABLET(25 MCG) BY MOUTH DAILY BEFORE BREAKFAST  . metoprolol succinate (TOPROL-XL) 50 MG 24 hr tablet Take 50 mg by mouth every morning. Take one tablet daily for blood pressure  . Multiple Vitamins-Minerals (CENTRUM SILVER  ADULT 50+) TABS Take 1 tablet by mouth every morning.  . tamsulosin (FLOMAX) 0.4 MG CAPS capsule Take 0.4 mg by mouth daily after breakfast. Reported on 11/14/2015                   Objective:   Physical Exam    amb wm struggles to get out of chair and onto exam table due to legs, not sob   08/26/2017        252   07/01/17 251 lb 9.6 oz (114.1 kg)  12/25/16 248 lb 11.2 oz (112.8 kg)  12/21/16 249 lb 4.8 oz (113.1 kg)    Vital signs reviewed - Note on arrival 02 sats  95% on RA        HEENT: nl dentition, turbinates bilaterally, and oropharynx. Nl external ear canals without cough reflex   NECK :  without JVD/Nodes/TM/ nl carotid upstrokes bilaterally   LUNGS: no acc muscle use,  Nl contour chest with slt distant bs bilaterally    CV:  RRR  no s3 or murmur or increase in P2, and no edema   ABD:  soft and nontender with nl inspiratory excursion in the supine position. No bruits or organomegaly appreciated, bowel sounds nl  MS:  Nl gait/ ext warm without deformities, calf tenderness, cyanosis or clubbing No obvious joint restrictions   SKIN: warm and dry without lesions    NEURO:  alert, approp, nl sensorium with  no motor or cerebellar deficits apparent.                  Assessment:

## 2017-08-26 NOTE — Telephone Encounter (Signed)
Tramadol #30

## 2017-08-26 NOTE — Patient Instructions (Addendum)
Work on inhaler technique:  relax and gently blow all the way out then take a nice smooth deep breath back in, triggering the inhaler at same time you start breathing in.  Hold for up to 5 seconds if you can. Blow out thru nose. Rinse and gargle with water when done      No change in medications   Please schedule a follow up visit in 3 months but call sooner if needed  

## 2017-08-26 NOTE — Telephone Encounter (Signed)
Called patient to let him know Rx was sent into pharm no answer LMOM.

## 2017-08-26 NOTE — Progress Notes (Signed)
PFT done today. 

## 2017-08-26 NOTE — Telephone Encounter (Signed)
Called Rx into pharm

## 2017-08-27 ENCOUNTER — Encounter: Payer: Self-pay | Admitting: Internal Medicine

## 2017-08-27 NOTE — Assessment & Plan Note (Signed)
PFT's  1 /20/15  FEV1 1.52 (59 % ) ratio 67  p 8 % improvement from saba p ? prior to study with DLCO  38 % corrects to 56  % for alv volume  But not hgb Spirometry 07/01/2017  FEV1  1.46 (44%)  Ratio 60 p 22% from saba and classic f/v contours  - 07/01/2017  After extensive coaching HFA effectiveness =    75% from a baseline of 25% > try symb 80 2bid    - 08/26/2017  After extensive coaching HFA effectiveness =    75% from a baseline of 50%  - PFT's  08/26/2017  FEV1 1.80 (74 % ) ratio 66  p 15 % improvement from saba p nothing prior to study with DLCO  34/39c % corrects to 54  % for alv volume   Whether we call this chronic asthma vs copd with AB component (which I favor based on low dlco) is a moot issue as All goals of chronic asthma control met including optimal function and elimination of symptoms with minimal need for rescue therapy.  Contingencies discussed in full including contacting this office immediately if not controlling the symptoms using the rule of two's.      I had an extended discussion with the patient and wife reviewing all relevant studies completed to date and  lasting 15 to 20 minutes of a 25 minute visit    Each maintenance medication was reviewed in detail including most importantly the difference between maintenance and prns and under what circumstances the prns are to be triggered using an action plan format that is not reflected in the computer generated alphabetically organized AVS.    Please see AVS for specific instructions unique to this visit that I personally wrote and verbalized to the the pt in detail and then reviewed with pt  by my nurse highlighting any  changes in therapy recommended at today's visit to their plan of care.

## 2017-09-02 ENCOUNTER — Ambulatory Visit (HOSPITAL_BASED_OUTPATIENT_CLINIC_OR_DEPARTMENT_OTHER): Payer: Medicare Other

## 2017-09-02 ENCOUNTER — Other Ambulatory Visit (HOSPITAL_BASED_OUTPATIENT_CLINIC_OR_DEPARTMENT_OTHER): Payer: Medicare Other

## 2017-09-02 VITALS — BP 142/75 | HR 72 | Temp 98.0°F | Resp 18

## 2017-09-02 DIAGNOSIS — N183 Chronic kidney disease, stage 3 unspecified: Secondary | ICD-10-CM

## 2017-09-02 DIAGNOSIS — D631 Anemia in chronic kidney disease: Secondary | ICD-10-CM | POA: Diagnosis not present

## 2017-09-02 LAB — CBC WITH DIFFERENTIAL/PLATELET
BASO%: 0.4 % (ref 0.0–2.0)
Basophils Absolute: 0 10*3/uL (ref 0.0–0.1)
EOS%: 2.6 % (ref 0.0–7.0)
Eosinophils Absolute: 0.2 10*3/uL (ref 0.0–0.5)
HCT: 32.5 % — ABNORMAL LOW (ref 38.4–49.9)
HGB: 10.4 g/dL — ABNORMAL LOW (ref 13.0–17.1)
LYMPH%: 5.9 % — ABNORMAL LOW (ref 14.0–49.0)
MCH: 27.3 pg (ref 27.2–33.4)
MCHC: 32 g/dL (ref 32.0–36.0)
MCV: 85.2 fL (ref 79.3–98.0)
MONO#: 0.9 10*3/uL (ref 0.1–0.9)
MONO%: 11.1 % (ref 0.0–14.0)
NEUT#: 6.4 10*3/uL (ref 1.5–6.5)
NEUT%: 80 % — ABNORMAL HIGH (ref 39.0–75.0)
Platelets: 337 10*3/uL (ref 140–400)
RBC: 3.81 10*6/uL — ABNORMAL LOW (ref 4.20–5.82)
RDW: 17.8 % — ABNORMAL HIGH (ref 11.0–14.6)
WBC: 8 10*3/uL (ref 4.0–10.3)
lymph#: 0.5 10*3/uL — ABNORMAL LOW (ref 0.9–3.3)

## 2017-09-02 LAB — IRON AND TIBC
%SAT: 8 % — AB (ref 20–55)
Iron: 32 ug/dL — ABNORMAL LOW (ref 42–163)
TIBC: 399 ug/dL (ref 202–409)
UIBC: 367 ug/dL (ref 117–376)

## 2017-09-02 LAB — FERRITIN: Ferritin: 12 ng/ml — ABNORMAL LOW (ref 22–316)

## 2017-09-02 MED ORDER — DARBEPOETIN ALFA 300 MCG/0.6ML IJ SOSY
300.0000 ug | PREFILLED_SYRINGE | Freq: Once | INTRAMUSCULAR | Status: AC
  Administered 2017-09-02: 300 ug via SUBCUTANEOUS

## 2017-09-02 MED ORDER — DARBEPOETIN ALFA 300 MCG/0.6ML IJ SOSY
PREFILLED_SYRINGE | INTRAMUSCULAR | Status: AC
  Filled 2017-09-02: qty 0.6

## 2017-09-06 ENCOUNTER — Telehealth (INDEPENDENT_AMBULATORY_CARE_PROVIDER_SITE_OTHER): Payer: Self-pay | Admitting: Physical Medicine and Rehabilitation

## 2017-09-06 NOTE — Telephone Encounter (Signed)
Pt has question pertaining to his shot schd on 09/08/2017.  [phone]

## 2017-09-06 NOTE — Telephone Encounter (Signed)
Please advise 

## 2017-09-06 NOTE — Telephone Encounter (Signed)
No injection needed if no pain

## 2017-09-06 NOTE — Telephone Encounter (Signed)
Patient called saying he is not longer experiencing any pain really in his hip and was wondering if he should keep his appointment with Dr. Ernestina Patches on Wednesday or not? CB # [phone] Michela Pitcher he would try and call back this afternoon, if not you can give him a call in the morning since he won't be available til then. Thank you.

## 2017-09-06 NOTE — Telephone Encounter (Signed)
CALLED PT TO ADVISE

## 2017-09-06 NOTE — Telephone Encounter (Signed)
Patient was just wondering if he should still get the injection because he is not in pain anymore. He would like a call back before 9:30 a.m. tomorrow Because he has doctor appointments. So he just needs to know whether to come on Wednesday to get the injection or not. Please Advise # [phone]

## 2017-09-06 NOTE — Telephone Encounter (Signed)
He does not need to see Dr. Ernestina Patches if he no longer has pain.  I am glad he is feeling better.

## 2017-09-07 ENCOUNTER — Other Ambulatory Visit: Payer: Self-pay | Admitting: *Deleted

## 2017-09-07 MED ORDER — LEVOTHYROXINE SODIUM 25 MCG PO TABS: ORAL_TABLET | ORAL | 1 refills | Status: DC

## 2017-09-07 NOTE — Telephone Encounter (Signed)
Patient notified and appointment cancelled.

## 2017-09-07 NOTE — Telephone Encounter (Signed)
[facility] 

## 2017-09-08 ENCOUNTER — Ambulatory Visit (INDEPENDENT_AMBULATORY_CARE_PROVIDER_SITE_OTHER): Payer: Medicare Other | Admitting: Physical Medicine and Rehabilitation

## 2017-09-28 ENCOUNTER — Telehealth: Payer: Self-pay | Admitting: Oncology

## 2017-09-28 ENCOUNTER — Inpatient Hospital Stay: Payer: Medicare Other

## 2017-09-28 ENCOUNTER — Inpatient Hospital Stay: Payer: Medicare Other | Attending: Oncology | Admitting: Oncology

## 2017-09-28 VITALS — BP 135/35 | HR 92 | Temp 98.2°F | Resp 18 | Ht 68.0 in | Wt 255.9 lb

## 2017-09-28 DIAGNOSIS — D631 Anemia in chronic kidney disease: Secondary | ICD-10-CM | POA: Diagnosis not present

## 2017-09-28 DIAGNOSIS — N183 Chronic kidney disease, stage 3 unspecified: Secondary | ICD-10-CM

## 2017-09-28 DIAGNOSIS — Z923 Personal history of irradiation: Secondary | ICD-10-CM

## 2017-09-28 DIAGNOSIS — N189 Chronic kidney disease, unspecified: Secondary | ICD-10-CM | POA: Diagnosis not present

## 2017-09-28 DIAGNOSIS — D649 Anemia, unspecified: Secondary | ICD-10-CM

## 2017-09-28 DIAGNOSIS — C61 Malignant neoplasm of prostate: Secondary | ICD-10-CM | POA: Diagnosis not present

## 2017-09-28 DIAGNOSIS — E291 Testicular hypofunction: Secondary | ICD-10-CM | POA: Diagnosis not present

## 2017-09-28 DIAGNOSIS — D509 Iron deficiency anemia, unspecified: Secondary | ICD-10-CM | POA: Diagnosis not present

## 2017-09-28 LAB — CBC WITH DIFFERENTIAL/PLATELET
Abs Granulocyte: 6.5 10*3/uL (ref 1.5–6.5)
Basophils Absolute: 0 10*3/uL (ref 0.0–0.1)
Basophils Relative: 0 %
EOS PCT: 2 %
Eosinophils Absolute: 0.2 10*3/uL (ref 0.0–0.5)
HCT: 28.3 % — ABNORMAL LOW (ref 38.4–49.9)
Hemoglobin: 8.6 g/dL — ABNORMAL LOW (ref 13.0–17.1)
LYMPHS PCT: 10 %
Lymphs Abs: 0.9 10*3/uL (ref 0.9–3.3)
MCH: 27.1 pg — AB (ref 27.2–33.4)
MCHC: 30.4 g/dL — AB (ref 32.0–36.0)
MCV: 89.3 fL (ref 79.3–98.0)
MONO ABS: 0.8 10*3/uL (ref 0.1–0.9)
MONOS PCT: 10 %
Neutro Abs: 6.5 10*3/uL (ref 1.5–6.5)
Neutrophils Relative %: 78 %
PLATELETS: 205 10*3/uL (ref 140–400)
RBC: 3.17 MIL/uL — ABNORMAL LOW (ref 4.20–5.82)
RDW: 17.9 % — ABNORMAL HIGH (ref 11.0–15.6)
WBC: 8.4 10*3/uL (ref 4.0–10.3)

## 2017-09-28 MED ORDER — DARBEPOETIN ALFA 300 MCG/0.6ML IJ SOSY
300.0000 ug | PREFILLED_SYRINGE | Freq: Once | INTRAMUSCULAR | Status: AC
  Administered 2017-09-28: 300 ug via SUBCUTANEOUS

## 2017-09-28 MED ORDER — DARBEPOETIN ALFA 300 MCG/0.6ML IJ SOSY
PREFILLED_SYRINGE | INTRAMUSCULAR | Status: AC
  Filled 2017-09-28: qty 0.6

## 2017-09-28 NOTE — Telephone Encounter (Signed)
Gave avs and calendar for January - march °

## 2017-09-28 NOTE — Patient Instructions (Signed)

## 2017-09-28 NOTE — Progress Notes (Addendum)
Hematology and Oncology Follow Up Visit  Kevin Kane [id number] [birth year] 84 y.o. 09/28/2017 12:11 PM Kane, Kevin L, DOReed, Kevin L, DO   Principle Diagnosis: 84 year old with the following issues:  1. Prostate cancer diagnosed in September 2017. He had a Gleason score 4+5 = 9 clinical stage T2c. PSA was 15.9.  He continues to be disease free at this time.  2. Multifactorial anemia: He has anemia of iron deficiency as well as chronic disease and renal insufficiency.   Prior Therapy:   He is status post androgen deprivation therapy followed by radiation therapy completed in December 2017.  He is status post a bone marrow biopsy in April 2018 which did not show any myelodysplasia.  Current therapy: Aranesp 300 mcg every 4 weeks started in October 2018.  He will receive that to keep his hemoglobin above 11.  He is also to receive intermittent iron replacement as needed.  Interim History:  Kevin Kane is here for a follow-up visit with his wife.  He reports increased fatigue since the last visit.  He reports that his mobility is about the same but his stamina has decreased.  He also have reported increased dizziness as well as shortness of breath.  His shortness of breath has been chronic and may be slightly worsened.  He denies any hematochezia or melena.  He denies any hemoptysis.  He denied any chest pain or lower extremity edema.  He still ambulates with the help of a walker without any falls or syncope.  He just returned from a trip and was able to fly without any difficulties.  He continues to receive Aranesp without any complications.  He does not report any headaches, blurry vision, syncope or seizures.  He does not report any new neurological deficits.  He does not report any fevers, chills, sweats or weight loss. He isn't report any palpitation, orthopnea or leg edema. He does not report any cough, wheezing or hemoptysis. He does not report any nausea, vomiting or abdominal pain. He  does not report any frequency urgency or hesitancy. He does not report any hematuria or dysuria. He does not report any skeletal complaints.  He does not report any arthralgias or myalgias.  He does not report any anxiety issues or depression.  Remaining review of systems is negative.   Medications: I have reviewed the patient's current medications.  Current Outpatient Medications  Medication Sig Dispense Refill  . acyclovir (ZOVIRAX) 400 MG tablet Take 400 mg 5 (five) times daily by mouth.    Marland Kitchen aspirin 81 MG tablet Take 81 mg by mouth daily.    Marland Kitchen atorvastatin (LIPITOR) 80 MG tablet Take 80 mg by mouth every morning. Take one daily for cholesterol    . bicalutamide (CASODEX) 50 MG tablet Take 50 mg by mouth daily.     . budesonide-formoterol (SYMBICORT) 160-4.5 MCG/ACT inhaler Take 2 puffs first thing in am and then another 2 puffs about 12 hours later. 1 Inhaler 11  . budesonide-formoterol (SYMBICORT) 160-4.5 MCG/ACT inhaler Inhale 2 puffs into the lungs 2 (two) times daily. 1 Inhaler 0  . calcium citrate (CALCITRATE - DOSED IN MG ELEMENTAL CALCIUM) 950 MG tablet Take 200 mg of elemental calcium daily by mouth.    . fexofenadine (ALLEGRA) 180 MG tablet Take 180 mg by mouth daily.    Marland Kitchen levothyroxine (SYNTHROID, LEVOTHROID) 25 MCG tablet Take one tablet by mouth once daily before breakfast 90 tablet 1  . metoprolol succinate (TOPROL-XL) 50 MG 24 hr tablet Take  50 mg by mouth every morning. Take one tablet daily for blood pressure    . Multiple Vitamins-Minerals (CENTRUM SILVER ADULT 50+) TABS Take 1 tablet by mouth every morning.    . tamsulosin (FLOMAX) 0.4 MG CAPS capsule Take 0.4 mg by mouth daily after breakfast. Reported on 11/14/2015    . traMADol (ULTRAM) 50 MG tablet Take 1 tablet (50 mg total) by mouth daily as needed. 30 tablet 0   No current facility-administered medications for this visit.      Allergies:  Allergies  Allergen Reactions  . Ketoconazole Other (See Comments)     Adverse skin reaction-- legs turned red and purple    Past Medical History, Surgical history, Social history reviewed without any changes.  He denied any smoking or alcohol abuse.  Review of Systems:  Physical Exam: Blood pressure (!) 135/35, pulse 92, temperature 98.2 F (36.8 C), temperature source Oral, resp. rate 18, height 5' 8"  (1.727 m), weight 255 lb 14.4 oz (116.1 kg), SpO2 95 %. ECOG: 1 General appearance: Well-appearing gentleman appeared comfortable. Eyes: Sclera anicteric without injection Oral mucosa: Showed no erythema or ulcers. Lymph nodes: No lymphadenopathy noted in the cervical, cervical axillary or inguinal areas. Heart: No murmurs or gallops.  Regular rate and rhythm. Lung: Clear to auscultation without wheezes or dullness to percussion. Abdomin: No rebound or guarding.  Soft without tenderness or splenomegaly. Skin: No rashes or lesions.  No petechiae noted. Skeletal: No muscle tenderness or joint effusion.  Full range of motion noted most joints.   Lab Results: Lab Results  Component Value Date   WBC 8.4 09/28/2017   HGB 8.6 (L) 09/28/2017   HCT 28.3 (L) 09/28/2017   MCV 89.3 09/28/2017   PLT 205 09/28/2017     Chemistry      Component Value Date/Time   NA 142 12/21/2016 0931   NA 146 (H) 11/26/2016 1156   K 4.2 12/21/2016 0931   K 4.1 11/26/2016 1156   CL 108 12/21/2016 0931   CO2 26 12/21/2016 0931   CO2 27 11/26/2016 1156   BUN 38 (H) 12/21/2016 0931   BUN 32.3 (H) 11/26/2016 1156   CREATININE 1.56 (H) 12/21/2016 0931   CREATININE 1.8 (H) 11/26/2016 1156   GLU 100 11/19/2016      Component Value Date/Time   CALCIUM 9.4 12/21/2016 0931   CALCIUM 9.7 11/26/2016 1156   ALKPHOS 102 11/26/2016 1156   AST 20 11/26/2016 1156   ALT 19 11/26/2016 1156   BILITOT 0.52 11/26/2016 1156       Impression and Plan:  84 year old gentleman with the following issues:  1. Prostate cancer diagnosed in August 2017. PSA of 15.9 and clinical stage  TIIc. His prostate biopsy showed a Gleason score 4+5 = 9 with pattern 4+4 = 8 also noted. 11 out of 12 cores were involved with cancer.   Staging workup showed no evidence of metastatic disease and he has very little urinary tract symptoms at this time.  He completed definitive radiation therapy and currently completing androgen deprivation therapy for a total of 2 years.    I have recommended continuing androgen deprivation therapy for a total of 2 years without any additional therapy after that.  He will require PSA monitoring after that.  2. Multifactorial anemia: This is related to anemia of chronic disease, renal insufficiency and iron deficiency.  He is currently receiving Aranesp at 300 mcg every 4 weeks to keep his hemoglobin above 11.  Iron studies obtained in December  2018 was reviewed today and showed iron deficiency.  Risks and benefits of intravenous iron infusion was discussed today.  These complications include arthralgias, myalgias and rarely infusion related reaction and anaphylaxis.  After discussion today, he is agreeable to proceed with Feraheme at 1000 mg and 2 doses.  He will receive Aranesp as well today.    3. Follow-up: Will be in the immediate future for Feraheme infusion and for Aranesp monthly and a MD visit in 2 months.  15 minutes was spent face-to-face today and greater than 50% spent counseling and coordination of care of the patient.  This includes discussion about his future surveillance of prostate cancer as well as treatment strategies of his anemia.   Zola Button, MD 1/8/201912:11 PM

## 2017-10-05 ENCOUNTER — Inpatient Hospital Stay: Payer: Medicare Other

## 2017-10-05 VITALS — BP 147/57 | HR 55 | Temp 97.6°F | Resp 18

## 2017-10-05 DIAGNOSIS — D631 Anemia in chronic kidney disease: Secondary | ICD-10-CM | POA: Diagnosis not present

## 2017-10-05 DIAGNOSIS — D509 Iron deficiency anemia, unspecified: Secondary | ICD-10-CM | POA: Diagnosis not present

## 2017-10-05 DIAGNOSIS — N183 Chronic kidney disease, stage 3 (moderate): Principal | ICD-10-CM

## 2017-10-05 DIAGNOSIS — E291 Testicular hypofunction: Secondary | ICD-10-CM | POA: Diagnosis not present

## 2017-10-05 DIAGNOSIS — N189 Chronic kidney disease, unspecified: Secondary | ICD-10-CM | POA: Diagnosis not present

## 2017-10-05 DIAGNOSIS — C61 Malignant neoplasm of prostate: Secondary | ICD-10-CM | POA: Diagnosis not present

## 2017-10-05 DIAGNOSIS — Z923 Personal history of irradiation: Secondary | ICD-10-CM | POA: Diagnosis not present

## 2017-10-05 MED ORDER — SODIUM CHLORIDE 0.9 % IV SOLN
510.0000 mg | Freq: Once | INTRAVENOUS | Status: AC
  Administered 2017-10-05: 510 mg via INTRAVENOUS
  Filled 2017-10-05: qty 17

## 2017-10-05 MED ORDER — SODIUM CHLORIDE 0.9 % IV SOLN
Freq: Once | INTRAVENOUS | Status: AC
  Administered 2017-10-05: 10:00:00 via INTRAVENOUS

## 2017-10-05 NOTE — Patient Instructions (Signed)

## 2017-10-12 ENCOUNTER — Inpatient Hospital Stay: Payer: Medicare Other

## 2017-10-12 VITALS — BP 125/41 | HR 60 | Temp 98.1°F | Resp 18

## 2017-10-12 DIAGNOSIS — C61 Malignant neoplasm of prostate: Secondary | ICD-10-CM | POA: Diagnosis not present

## 2017-10-12 DIAGNOSIS — Z923 Personal history of irradiation: Secondary | ICD-10-CM | POA: Diagnosis not present

## 2017-10-12 DIAGNOSIS — D631 Anemia in chronic kidney disease: Secondary | ICD-10-CM | POA: Diagnosis not present

## 2017-10-12 DIAGNOSIS — N189 Chronic kidney disease, unspecified: Secondary | ICD-10-CM | POA: Diagnosis not present

## 2017-10-12 DIAGNOSIS — D509 Iron deficiency anemia, unspecified: Secondary | ICD-10-CM | POA: Diagnosis not present

## 2017-10-12 DIAGNOSIS — N183 Chronic kidney disease, stage 3 (moderate): Principal | ICD-10-CM

## 2017-10-12 DIAGNOSIS — E291 Testicular hypofunction: Secondary | ICD-10-CM | POA: Diagnosis not present

## 2017-10-12 MED ORDER — SODIUM CHLORIDE 0.9 % IV SOLN
510.0000 mg | Freq: Once | INTRAVENOUS | Status: AC
  Administered 2017-10-12: 510 mg via INTRAVENOUS
  Filled 2017-10-12: qty 17

## 2017-10-12 MED ORDER — SODIUM CHLORIDE 0.9 % IV SOLN
Freq: Once | INTRAVENOUS | Status: AC
  Administered 2017-10-12: 11:00:00 via INTRAVENOUS

## 2017-10-12 NOTE — Patient Instructions (Signed)

## 2017-10-26 ENCOUNTER — Inpatient Hospital Stay: Payer: Medicare Other | Attending: Oncology

## 2017-10-26 ENCOUNTER — Inpatient Hospital Stay: Payer: Medicare Other

## 2017-10-26 DIAGNOSIS — D631 Anemia in chronic kidney disease: Secondary | ICD-10-CM

## 2017-10-26 DIAGNOSIS — N183 Chronic kidney disease, stage 3 (moderate): Principal | ICD-10-CM

## 2017-10-26 DIAGNOSIS — D649 Anemia, unspecified: Secondary | ICD-10-CM

## 2017-10-26 LAB — CBC WITH DIFFERENTIAL/PLATELET
BASOS ABS: 0 10*3/uL (ref 0.0–0.1)
BASOS PCT: 1 %
EOS ABS: 0.1 10*3/uL (ref 0.0–0.5)
Eosinophils Relative: 2 %
HCT: 27.9 % — ABNORMAL LOW (ref 38.4–49.9)
Hemoglobin: 8.9 g/dL — ABNORMAL LOW (ref 13.0–17.1)
Lymphocytes Relative: 6 %
Lymphs Abs: 0.4 10*3/uL — ABNORMAL LOW (ref 0.9–3.3)
MCH: 29.7 pg (ref 27.2–33.4)
MCHC: 31.8 g/dL — ABNORMAL LOW (ref 32.0–36.0)
MCV: 93.5 fL (ref 79.3–98.0)
Monocytes Absolute: 0.8 10*3/uL (ref 0.1–0.9)
Monocytes Relative: 12 %
NEUTROS PCT: 79 %
Neutro Abs: 5.5 10*3/uL (ref 1.5–6.5)
Platelets: 254 10*3/uL (ref 140–400)
RBC: 2.99 MIL/uL — AB (ref 4.20–5.82)
RDW: 24.6 % — ABNORMAL HIGH (ref 11.0–14.6)
WBC: 6.9 10*3/uL (ref 4.0–10.3)

## 2017-10-26 MED ORDER — DARBEPOETIN ALFA 300 MCG/0.6ML IJ SOSY
PREFILLED_SYRINGE | INTRAMUSCULAR | Status: AC
  Filled 2017-10-26: qty 0.6

## 2017-10-26 MED ORDER — DARBEPOETIN ALFA 300 MCG/0.6ML IJ SOSY
300.0000 ug | PREFILLED_SYRINGE | Freq: Once | INTRAMUSCULAR | Status: AC
  Administered 2017-10-26: 300 ug via SUBCUTANEOUS

## 2017-11-29 ENCOUNTER — Ambulatory Visit (INDEPENDENT_AMBULATORY_CARE_PROVIDER_SITE_OTHER): Payer: Medicare Other | Admitting: Internal Medicine

## 2017-11-29 ENCOUNTER — Encounter: Payer: Self-pay | Admitting: Internal Medicine

## 2017-11-29 VITALS — BP 130/74 | HR 63 | Ht 67.0 in | Wt 250.6 lb

## 2017-11-29 DIAGNOSIS — J449 Chronic obstructive pulmonary disease, unspecified: Secondary | ICD-10-CM | POA: Diagnosis not present

## 2017-11-29 MED ORDER — GLYCOPYRROLATE-FORMOTEROL 9-4.8 MCG/ACT IN AERO: 2.0000 | INHALATION_SPRAY | Freq: Two times a day (BID) | RESPIRATORY_TRACT | 0 refills | Status: DC

## 2017-11-29 MED ORDER — GLYCOPYRROLATE-FORMOTEROL 9-4.8 MCG/ACT IN AERO: 2.0000 | INHALATION_SPRAY | Freq: Two times a day (BID) | RESPIRATORY_TRACT | 11 refills | Status: AC

## 2017-11-29 NOTE — Patient Instructions (Addendum)
Try Bevespi Take 2 puffs first thing in am and then another 2 puffs about 12 hours later - fill the prescription if you feel it helps your breathing    Call for appt when return from [place] from the summer

## 2017-11-29 NOTE — Progress Notes (Signed)
Subjective:    Patient ID: Kevin Kane, male   DOB: July 22, 1930,    MRN: [id number]      Brief patient profile:  8 yowm went to [place]/ [place]   Quit smoking in 1960 s obvious sequelae self referred to pulmonary clinic  07/01/2017 cc sob with evidence of asthma on pfts from [place]  From 06/02/17 and over use of saba chronically    History of Present Illness  07/01/2017 1st [facility] office visit/ Arlett Goold   Chief Complaint  Patient presents with  . Pulmonary Consult    Self referral. Pt c/o SOB for "many years". He gets winded walking short distances such as from lobby to exam room today.   doe x 20 years improves with saba gradually worse x  Now 50 ft and using saba in multiple forms day >> noct Also anemia last tx early Sept since RT   For prostate ca Sleeps ok flat / some mild leg swelling/ hb but no cough  rec Plan A = Automatic = Symbicort 160 Take 2 puffs first thing in am and then another 2 puffs about 12 hours later.  Work on inhaler technique:   Plan B = Backup Only use your albuterol as a rescue medication Plan C = Crisis - only use your ipatropium- albuterol nebulizer if you first try Plan B and it fails to help > ok to use the nebulizer up to every 4 hours but if start needing it regularly call for immediate appointment      08/26/2017  f/u ov/Ayodele Sangalang re:  Moderate asthma vs GOLD II copd  Chief Complaint  Patient presents with  . Follow-up    PFT's done today. His breathing has improved some.    really Not limited by breathing from desired activities on symbicort 160  2bid /rare need for saba hfa only, no neb  rec Work on inhaler technique:      11/29/2017  f/u ov/Lucindia Lemley re:   Mod asthma vs GOLD II copd miant symbicort Chief Complaint  Patient presents with  . Follow-up    Breathing is unchanged. He has a rescue inhaler but has not had to use it.    Dyspnea:  50 ft to dining room / also anemia but not much difference p tx  Cough: no Sleep:  sleeps on side horizontally ok s 02  SABA use:  None  No obvious day to day or daytime variability or assoc excess/ purulent sputum or mucus plugs or hemoptysis or cp or chest tightness, subjective wheeze or overt sinus or hb symptoms. No unusual exposure hx or h/o childhood pna/ asthma or knowledge of premature birth.  Sleeping ok flat without nocturnal  or early am exacerbation  of respiratory  c/o's or need for noct saba. Also denies any obvious fluctuation of symptoms with weather or environmental changes or other aggravating or alleviating factors except as outlined above   Current Allergies, Complete Past Medical History, Past Surgical History, Family History, and Social History were reviewed in [employer] record.  ROS  The following are not active complaints unless bolded Hoarseness, sore throat, dysphagia, dental problems, itching, sneezing,  nasal congestion or discharge of excess mucus or purulent secretions, ear ache,   fever, chills, sweats, unintended wt loss or wt gain, classically pleuritic or exertional cp,  orthopnea pnd or leg swelling, presyncope, palpitations, abdominal pain, anorexia, nausea, vomiting, diarrhea  or change in bowel habits or change in bladder habits, change in stools or  change in urine, dysuria, hematuria,  rash, arthralgias, visual complaints, headache, numbness, weakness or ataxia or problems with walking or coordination,  change in mood/affect or memory.        Current Meds  Medication Sig  . acyclovir (ZOVIRAX) 400 MG tablet Take 400 mg 5 (five) times daily by mouth.  Marland Kitchen albuterol (PROAIR HFA) 108 (90 Base) MCG/ACT inhaler Inhale 2 puffs into the lungs every 6 (six) hours as needed for wheezing or shortness of breath.  Marland Kitchen aspirin 81 MG tablet Take 81 mg by mouth daily.  Marland Kitchen atorvastatin (LIPITOR) 80 MG tablet Take 80 mg by mouth every morning. Take one daily for cholesterol  . bicalutamide (CASODEX) 50 MG tablet Take 50 mg by mouth  daily.   . calcium citrate (CALCITRATE - DOSED IN MG ELEMENTAL CALCIUM) 950 MG tablet Take 200 mg of elemental calcium daily by mouth.  . fexofenadine (ALLEGRA) 180 MG tablet Take 180 mg by mouth daily.  Marland Kitchen levothyroxine (SYNTHROID, LEVOTHROID) 25 MCG tablet Take one tablet by mouth once daily before breakfast  . metoprolol succinate (TOPROL-XL) 50 MG 24 hr tablet Take 50 mg by mouth every morning. Take one tablet daily for blood pressure  . Multiple Vitamins-Minerals (CENTRUM SILVER ADULT 50+) TABS Take 1 tablet by mouth every morning.  . tamsulosin (FLOMAX) 0.4 MG CAPS capsule Take 0.4 mg by mouth daily after breakfast. Reported on 11/14/2015  . traMADol (ULTRAM) 50 MG tablet Take 1 tablet (50 mg total) by mouth daily as needed.  . [DISCONTINUED] budesonide-formoterol (SYMBICORT) 160-4.5 MCG/ACT inhaler Inhale 2 puffs into the lungs 2 (two) times daily.                           Objective:   Physical Exam    amb obese wm with rollator due to balance    11/29/2017        250  08/26/2017        252   07/01/17 251 lb 9.6 oz (114.1 kg)  12/25/16 248 lb 11.2 oz (112.8 kg)  12/21/16 249 lb 4.8 oz (113.1 kg)    Vital signs reviewed - Note on arrival 02 sats  97% on RA         HEENT: nl dentition, turbinates bilaterally, and oropharynx. Nl external ear canals without cough reflex   NECK :  without JVD/Nodes/TM/ nl carotid upstrokes bilaterally   LUNGS: no acc muscle use,  Nl contour chest which is clear to A and P bilaterally with distant bs bilaterally s wheeze   CV:  RRR  no s3 or murmur or increase in P2, and no edema   ABD:  soft and nontender with nl inspiratory excursion in the supine position. No bruits or organomegaly appreciated, bowel sounds nl  MS:  Nl gait/ ext warm without deformities, calf tenderness, cyanosis or clubbing No obvious joint restrictions   SKIN: warm and dry without lesions    NEURO:  alert, approp, nl sensorium with  no motor or  cerebellar deficits apparent.                     Assessment:

## 2017-11-30 ENCOUNTER — Ambulatory Visit: Payer: Medicare Other | Admitting: Internal Medicine

## 2017-11-30 ENCOUNTER — Inpatient Hospital Stay: Payer: Medicare Other

## 2017-11-30 ENCOUNTER — Telehealth: Payer: Self-pay | Admitting: Oncology

## 2017-11-30 ENCOUNTER — Inpatient Hospital Stay: Payer: Medicare Other | Attending: Oncology | Admitting: Oncology

## 2017-11-30 VITALS — BP 115/44 | HR 86 | Temp 97.7°F | Resp 18 | Ht 67.0 in | Wt 253.9 lb

## 2017-11-30 DIAGNOSIS — D509 Iron deficiency anemia, unspecified: Secondary | ICD-10-CM | POA: Diagnosis not present

## 2017-11-30 DIAGNOSIS — Z923 Personal history of irradiation: Secondary | ICD-10-CM

## 2017-11-30 DIAGNOSIS — E291 Testicular hypofunction: Secondary | ICD-10-CM

## 2017-11-30 DIAGNOSIS — D638 Anemia in other chronic diseases classified elsewhere: Secondary | ICD-10-CM | POA: Diagnosis not present

## 2017-11-30 DIAGNOSIS — D631 Anemia in chronic kidney disease: Secondary | ICD-10-CM

## 2017-11-30 DIAGNOSIS — N183 Chronic kidney disease, stage 3 unspecified: Secondary | ICD-10-CM

## 2017-11-30 DIAGNOSIS — C61 Malignant neoplasm of prostate: Secondary | ICD-10-CM | POA: Diagnosis not present

## 2017-11-30 DIAGNOSIS — D649 Anemia, unspecified: Secondary | ICD-10-CM

## 2017-11-30 LAB — CBC WITH DIFFERENTIAL/PLATELET
Basophils Absolute: 0 10*3/uL (ref 0.0–0.1)
Basophils Relative: 0 %
EOS ABS: 0.2 10*3/uL (ref 0.0–0.5)
EOS PCT: 2 %
HCT: 27.8 % — ABNORMAL LOW (ref 38.4–49.9)
Hemoglobin: 8.5 g/dL — ABNORMAL LOW (ref 13.0–17.1)
LYMPHS ABS: 0.5 10*3/uL — AB (ref 0.9–3.3)
Lymphocytes Relative: 5 %
MCH: 29.7 pg (ref 27.2–33.4)
MCHC: 30.6 g/dL — ABNORMAL LOW (ref 32.0–36.0)
MCV: 97.2 fL (ref 79.3–98.0)
Monocytes Absolute: 1 10*3/uL — ABNORMAL HIGH (ref 0.1–0.9)
Monocytes Relative: 11 %
Neutro Abs: 7.9 10*3/uL — ABNORMAL HIGH (ref 1.5–6.5)
Neutrophils Relative %: 82 %
PLATELETS: 285 10*3/uL (ref 140–400)
RBC: 2.86 MIL/uL — AB (ref 4.20–5.82)
RDW: 17.4 % — AB (ref 11.0–14.6)
WBC: 9.6 10*3/uL (ref 4.0–10.3)

## 2017-11-30 LAB — IRON AND TIBC
Iron: 44 ug/dL (ref 42–163)
SATURATION RATIOS: 13 % — AB (ref 42–163)
TIBC: 340 ug/dL (ref 202–409)
UIBC: 296 ug/dL

## 2017-11-30 LAB — FERRITIN: FERRITIN: 17 ng/mL — AB (ref 22–316)

## 2017-11-30 MED ORDER — DARBEPOETIN ALFA 300 MCG/0.6ML IJ SOSY
300.0000 ug | PREFILLED_SYRINGE | Freq: Once | INTRAMUSCULAR | Status: AC
  Administered 2017-11-30: 300 ug via SUBCUTANEOUS

## 2017-11-30 MED ORDER — DARBEPOETIN ALFA 300 MCG/0.6ML IJ SOSY
PREFILLED_SYRINGE | INTRAMUSCULAR | Status: AC
Start: 2017-11-30 — End: 2017-11-30
  Filled 2017-11-30: qty 0.6

## 2017-11-30 NOTE — Progress Notes (Signed)
Hematology and Oncology Follow Up Visit  Kevin Kane [id number] [birth year] 84 y.o. 11/30/2017 11:14 AM Kevin Kane, Kevin Kane, DOReed, Kevin L, DO   Principle Diagnosis: 84 year old with the:   1. T2c prostate cancer diagnosed in September 2017. He presented with a PSA of 15.9 and Gleason score 4+5 = 9.   2. Multifactorial anemia: Related to reveal disease in addition to iron deficiency.   Prior Therapy:   He is status post androgen deprivation therapy followed by radiation therapy completed in December 2017. Androgen deprivation therapy to be completed in 2019.  He is status post a bone marrow biopsy in April 2018 which did not show any myelodysplasia.  Current therapy: Aranesp 300 mcg every 4 weeks started in October 2018.  This will be scheduled every 3 weeks starting 11/30/2017.  Interim History:  [name] presents today for a follow-up. He reports no major changes in his health since the last visit. He continues to receive Aranesp without any major complications and also received intravenous iron which have helped his symptoms. He does report some mild fatigue but no dizziness, unsteadiness, fall or syncope. He still able to attend to her activities of daily living. He ambulates with the help of a walker. He had any hematochezia or melena. He denied any hemoptysis.    He does not report any headaches, blurry vision, syncope or seizures.  He does not report any new neurological deficits.  He does not report any fevers, chills, sweats or weight loss. He he denied any palpitation, orthopnea or leg edema. He does not report any cough, wheezing or hemoptysis. He does not report any nausea, vomiting or abdominal pain. He does not report any frequency urgency or hesitancy. He does not report any hematuria or dysuria. He does not report any skeletal complaints.  He does not report any arthralgias or myalgias.  He does not report any anxiety issues or depression.  Remaining review of systems is  negative.   Medications: I have reviewed the patient's current medications.  Current Outpatient Medications  Medication Sig Dispense Refill  . acyclovir (ZOVIRAX) 400 MG tablet Take 400 mg 5 (five) times daily by mouth.    Marland Kitchen albuterol (PROAIR HFA) 108 (90 Base) MCG/ACT inhaler Inhale 2 puffs into the lungs every 6 (six) hours as needed for wheezing or shortness of breath.    Marland Kitchen aspirin 81 MG tablet Take 81 mg by mouth daily.    Marland Kitchen atorvastatin (LIPITOR) 80 MG tablet Take 80 mg by mouth every morning. Take one daily for cholesterol    . bicalutamide (CASODEX) 50 MG tablet Take 50 mg by mouth daily.     . calcium citrate (CALCITRATE - DOSED IN MG ELEMENTAL CALCIUM) 950 MG tablet Take 200 mg of elemental calcium daily by mouth.    . fexofenadine (ALLEGRA) 180 MG tablet Take 180 mg by mouth daily.    . Glycopyrrolate-Formoterol (BEVESPI AEROSPHERE) 9-4.8 MCG/ACT AERO Inhale 2 puffs into the lungs 2 (two) times daily. 1 Inhaler 11  . levothyroxine (SYNTHROID, LEVOTHROID) 25 MCG tablet Take one tablet by mouth once daily before breakfast 90 tablet 1  . metoprolol succinate (TOPROL-XL) 50 MG 24 hr tablet Take 50 mg by mouth every morning. Take one tablet daily for blood pressure    . Multiple Vitamins-Minerals (CENTRUM SILVER ADULT 50+) TABS Take 1 tablet by mouth every morning.    . tamsulosin (FLOMAX) 0.4 MG CAPS capsule Take 0.4 mg by mouth daily after breakfast. Reported on 11/14/2015    .  traMADol (ULTRAM) 50 MG tablet Take 1 tablet (50 mg total) by mouth daily as needed. 30 tablet 0   No current facility-administered medications for this visit.      Allergies:  Allergies  Allergen Reactions  . Ketoconazole Other (See Comments)    Adverse skin reaction-- legs turned red and purple    Past Medical History, Surgical history, Social history reviewed without any changes.  He denied any smoking or alcohol abuse.  Review of Systems:  Physical Exam: Blood pressure (!) 115/44, pulse 86,  temperature 97.7 F (36.5 C), temperature source Oral, resp. rate 18, height _0  (1.702 m), weight 253 lb 14.4 oz (115.2 kg), SpO2 94 %. ECOG: 1 General appearance: Alert, awake gentleman without distress. Head: Atraumatic without any abnormalities. Eyes: No scleral icterus. Oral mucosa: Showed no erythema or ulcers. Lymph nodes: No lymphadenopathy palpated in the cervical, supraclavicular areas. Heart: Regular rate and rhythm without murmurs rubs or gallops. Lung: No rhonchi or wheezes. Clear to auscultation. Abdomin: Soft, nontender without any rebound or guarding. Skin: No petechiae or rash. Musculoskeletal: No joint deformity or effusion.  Lab Results: Lab Results  Component Value Date   WBC 9.6 11/30/2017   HGB 8.5 (Kane) 11/30/2017   HCT 27.8 (Kane) 11/30/2017   MCV 97.2 11/30/2017   PLT 285 11/30/2017     Chemistry      Component Value Date/Time   NA 142 12/21/2016 0931   NA 146 (H) 11/26/2016 1156   K 4.2 12/21/2016 0931   K 4.1 11/26/2016 1156   CL 108 12/21/2016 0931   CO2 26 12/21/2016 0931   CO2 27 11/26/2016 1156   BUN 38 (H) 12/21/2016 0931   BUN 32.3 (H) 11/26/2016 1156   CREATININE 1.56 (H) 12/21/2016 0931   CREATININE 1.8 (H) 11/26/2016 1156   GLU 100 11/19/2016      Component Value Date/Time   CALCIUM 9.4 12/21/2016 0931   CALCIUM 9.7 11/26/2016 1156   ALKPHOS 102 11/26/2016 1156   AST 20 11/26/2016 1156   ALT 19 11/26/2016 1156   BILITOT 0.52 11/26/2016 1156       Impression and Plan:  84 year old gentleman with the following issues:  1. Early-stage orostate cancer diagnosed in August 2017. He received definitive therapy with radiation and currently finishing androgen deprivation therapy. I encouraged him to continue androgen deprivation which will complete 2 years of therapy this year.  The natural course of this disease was reviewed today and will likely require periodic monitoring of his PSA upon completing androgen deprivation.  2. Anemia  of chronic disease with element of iron deficiency.  He is currently receiving Aranesp at 300 mcg every 4 weeks to keep his hemoglobin above 11.  He is status post intravenous iron in January 2019 without any significant improvement in his hemoglobin. The plan is to continue with Aranesp at this time at 300 g every 3 weeks and hope for a better response at this time.  Cell transfusion is needed.    3. Follow-up: Will be in 3 weeks for Aranesp injection. He will travel to [place] for the summer and will return here in September 2019.  15 minutes was spent face-to-face today and greater than 50% spent counseling and coordination of care of the patient.     Zola Button, MD 3/12/201911:14 AM

## 2017-11-30 NOTE — Telephone Encounter (Signed)
Scheduled appt per 3/12 los - patient is aware of appt date and time - no print out wanted.

## 2017-12-06 ENCOUNTER — Encounter: Payer: Self-pay | Admitting: Internal Medicine

## 2017-12-06 DIAGNOSIS — J449 Chronic obstructive pulmonary disease, unspecified: Secondary | ICD-10-CM | POA: Insufficient documentation

## 2017-12-06 NOTE — Assessment & Plan Note (Addendum)
PFT's  1 /20/15  FEV1 1.52 (59 % ) ratio 67  p 8 % improvement from saba p ? prior to study with DLCO  38 % corrects to 56  % for alv volume  But not hgb Spirometry 07/01/2017  FEV1  1.46 (44%)  Ratio 60 p 22% from saba and classic f/v contours  - 07/01/2017  After extensive coaching HFA effectiveness =    75% from a baseline of 25% > try symb 80 2bid    - 08/26/2017  After extensive coaching HFA effectiveness =    75% from a baseline of 50%  - PFT's  08/26/2017  FEV1 1.80 (74 % ) ratio 66  p 15 % improvement from saba p nothing prior to study with DLCO  34/39c % corrects to 54  % for alv volume    - 11/29/2017  After extensive coaching inhaler device  effectiveness =    90% try bevespi 2bid    If this is copd rather than asthma (as suggested by the low dlco) then Pt is Group B in terms of symptom/risk and laba/lama therefore appropriate rx at this point and worth trying bevespi as continues to be limited by doe or add spiriva to symbicort if starts acting more like AB  I had an extended discussion with the patient reviewing all relevant studies completed to date and  lasting 15 to 20 minutes of a 25 minute visit    Formulary restrictions will be an ongoing challenge for the forseable future and I would be happy to pick an alternative if the pt will first  provide me a list of them but pt  will need to return here for training for any new device that is required eg dpi vs hfa vs respimat.    In meantime we can always provide samples so the patient never runs out of any needed respiratory medications.   Each maintenance medication was reviewed in detail including most importantly the difference between maintenance and prns and under what circumstances the prns are to be triggered using an action plan format that is not reflected in the computer generated alphabetically organized AVS.    Please see AVS for specific instructions unique to this visit that I personally wrote and verbalized to the the pt  in detail and then reviewed with pt  by my nurse highlighting any  changes in therapy recommended at today's visit to their plan of care.

## 2017-12-06 NOTE — Assessment & Plan Note (Signed)
Body mass index is 39.25 kg/m.  -  Trending down slightly - encouraged Lab Results  Component Value Date   TSH 3.98 11/21/2015     Contributing to gerd risk/ doe/reviewed the need and the process to achieve and maintain neg calorie balance > defer f/u primary care including intermittently monitoring thyroid status

## 2017-12-15 ENCOUNTER — Encounter: Payer: Self-pay | Admitting: Internal Medicine

## 2017-12-15 ENCOUNTER — Non-Acute Institutional Stay: Payer: Medicare Other | Admitting: Internal Medicine

## 2017-12-15 VITALS — BP 136/60 | HR 71 | Temp 97.8°F | Ht 67.0 in | Wt 255.0 lb

## 2017-12-15 DIAGNOSIS — I872 Venous insufficiency (chronic) (peripheral): Secondary | ICD-10-CM | POA: Diagnosis not present

## 2017-12-15 DIAGNOSIS — J4489 Other specified chronic obstructive pulmonary disease: Secondary | ICD-10-CM

## 2017-12-15 DIAGNOSIS — D631 Anemia in chronic kidney disease: Secondary | ICD-10-CM | POA: Diagnosis not present

## 2017-12-15 DIAGNOSIS — I10 Essential (primary) hypertension: Secondary | ICD-10-CM

## 2017-12-15 DIAGNOSIS — C61 Malignant neoplasm of prostate: Secondary | ICD-10-CM

## 2017-12-15 DIAGNOSIS — M7062 Trochanteric bursitis, left hip: Secondary | ICD-10-CM

## 2017-12-15 DIAGNOSIS — E039 Hypothyroidism, unspecified: Secondary | ICD-10-CM | POA: Diagnosis not present

## 2017-12-15 DIAGNOSIS — N183 Chronic kidney disease, stage 3 unspecified: Secondary | ICD-10-CM

## 2017-12-15 DIAGNOSIS — J449 Chronic obstructive pulmonary disease, unspecified: Secondary | ICD-10-CM | POA: Diagnosis not present

## 2017-12-15 DIAGNOSIS — L2082 Flexural eczema: Secondary | ICD-10-CM | POA: Diagnosis not present

## 2017-12-15 NOTE — Progress Notes (Signed)
Location:  Occupational psychologist of Service:  Clinic (12)  Provider: Quita Mcgrory L. Mariea Clonts, D.O., C.M.D.  Code Status: FULL code Goals of Care:  Advanced Directives 08/11/2017  Does Patient Have a Medical Advance Directive? Yes  Type of Advance Directive [place]  Does patient want to make changes to medical advance directive? No - Patient declined  Copy of [place] in Chart? Yes  Would patient like information on creating a medical advance directive? -   Chief Complaint  Patient presents with  . Medical Management of Chronic Issues    20mth follow-up    HPI: Patient is a 84 y.o. male seen today for medical management of chronic diseases--4 month f/u--last seen in November '18.    He continues to have his anemia--gets his aranesp and sees Dr. Alen Blew every other shot.  Remains very short of breath.  Due it part to anemia and part due to COPD.    Dr. Melvyn Novas switched his inhaler.  He does not notice any change.  He rarelly uses his albuterol.    Has rash on his right medial arm that looks like eczema--he says he has an allergy to cut grass and breaks out this rash.    Had skin cancer removed on left posterior arm over the winter here.  Sleeps well.    Plans to see urology in [place].  He's on the last of his 4 q 6 mos shots for his prostate cancer.    Hip pain went away.  Not great--sometimes back, hip, ankle bothersome.  Says his dependence on his walker increases as years go by--has the fold-up one for the car that he's using today and another at home.  Has to hold onto items as he goes.    Here until 5/1, then gone until late Sept in [place].  Past Medical History:  Diagnosis Date  . At risk for sleep apnea    STOP-BANG=5      SENT TO PCP 09-25-2014  . Bilateral lower extremity edema   . COPD mixed type [facility]) pulmologist-  dr Reginia Naas [facility])  note in epic under Care Everywhere tab)   per PFTs: 10/10/2013: Evidence of  moderate obstructive lung disease with small airway involvement (asthma); FEV1 54% predicted, FEV1/FVC <70%, FEF25-75% is reduced. Mild response to bronchodilator; FEV1 w/ 8% change after bronchodilator.    -- and  mild intermittant asthma  . Coronary atherosclerosis of native coronary artery    cardiologist-  dr schwengel  in [place], [place] (where pt lives during spring and summer)  . Eczema   . Elevated prostate specific antigen (PSA) 10/10/2006  . First degree heart block   . Frequency of urination   . GERD (gastroesophageal reflux disease)   . Herpes simplex of eye    bilateral eye-  takes acyclovir daily  . History of kidney stones   . History of MI (myocardial infarction)    2001--  s/p  PCI and stent  . Hyperlipidemia   . Hypertension   . Hypothyroidism   . Mild intermittent asthma   . Nephrolithiasis    right  . OA (osteoarthritis)   . Prostate cancer ([place])   . Rheumatoid arthritis ([place]) 05/2013  . Right ureteral stone   . Urgency of urination   . Wears glasses   . Wears hearing aid    bilateral    Past Surgical History:  Procedure Laterality Date  . CATARACT EXTRACTION W/ INTRAOCULAR  LENS  IMPLANT, BILATERAL Bilateral 2014  . COLONOSCOPY  2010  . CORONARY ANGIOPLASTY WITH STENT PLACEMENT  2001   ([address] [place])   PCI and stenting  . CYSTOSCOPY WITH RETROGRADE PYELOGRAM, URETEROSCOPY AND STENT PLACEMENT Right 09/30/2015   Procedure: RIGHT URETEROSCOPY, RETROGRADE PYELOGRAM, LASER LITHOTRIPSY AND STENT PLACEMENT;  Surgeon: Kathie Rhodes, MD;  Location: [facility];  Service: Urology;  Laterality: Right;  . CYSTOSCOPY/URETEROSCOPY/HOLMIUM LASER/STENT PLACEMENT Right 11/04/2015   Procedure: RIGHT URETEROSCOPY/RETROGRADE PYELOGRAM/HOLMIUM LASER LITHOTRIPSY/STENT PLACEMENT;  Surgeon: Kathie Rhodes, MD;  Location: [facility];  Service: Urology;  Laterality: Right;  . PILONIDAL CYST EXCISION  2006  . TONSILLECTOMY  as child  . TOTAL HIP ARTHROPLASTY Left  2001    Allergies  Allergen Reactions  . Ketoconazole Other (See Comments)    Adverse skin reaction-- legs turned red and purple    Outpatient Encounter Medications as of 12/15/2017  Medication Sig  . acyclovir (ZOVIRAX) 400 MG tablet Take 400 mg 5 (five) times daily by mouth.  Marland Kitchen albuterol (PROAIR HFA) 108 (90 Base) MCG/ACT inhaler Inhale 2 puffs into the lungs every 6 (six) hours as needed for wheezing or shortness of breath.  Marland Kitchen aspirin 81 MG tablet Take 81 mg by mouth daily.  Marland Kitchen atorvastatin (LIPITOR) 80 MG tablet Take 80 mg by mouth every morning. Take one daily for cholesterol  . bicalutamide (CASODEX) 50 MG tablet Take 50 mg by mouth daily.   . calcium citrate (CALCITRATE - DOSED IN MG ELEMENTAL CALCIUM) 950 MG tablet Take 200 mg of elemental calcium daily by mouth.  . Glycopyrrolate-Formoterol (BEVESPI AEROSPHERE) 9-4.8 MCG/ACT AERO Inhale 2 puffs into the lungs 2 (two) times daily.  Marland Kitchen levothyroxine (SYNTHROID, LEVOTHROID) 25 MCG tablet Take one tablet by mouth once daily before breakfast  . metoprolol succinate (TOPROL-XL) 50 MG 24 hr tablet Take 50 mg by mouth every morning. Take one tablet daily for blood pressure  . Multiple Vitamins-Minerals (CENTRUM SILVER ADULT 50+) TABS Take 1 tablet by mouth every morning.  . tamsulosin (FLOMAX) 0.4 MG CAPS capsule Take 0.4 mg by mouth daily after breakfast. Reported on 11/14/2015  . [DISCONTINUED] fexofenadine (ALLEGRA) 180 MG tablet Take 180 mg by mouth daily.  . [DISCONTINUED] traMADol (ULTRAM) 50 MG tablet Take 1 tablet (50 mg total) by mouth daily as needed.   No facility-administered encounter medications on file as of 12/15/2017.     Review of Systems:  Review of Systems  Constitutional: Positive for malaise/fatigue. Negative for chills, diaphoresis and fever.  HENT: Positive for hearing loss. Negative for congestion.   Eyes: Negative for blurred vision.  Respiratory: Negative for cough and shortness of breath.   Cardiovascular:  Negative for chest pain, palpitations and leg swelling.  Gastrointestinal: Negative for abdominal pain, blood in stool, constipation, diarrhea and melena.  Genitourinary: Positive for urgency. Negative for dysuria, flank pain, frequency and hematuria.       Some incontinence if he doesn't get to the restroom fast enough, but no nocturia; not wearing depends yet  Musculoskeletal: Positive for joint pain. Negative for falls.  Skin: Negative for itching and rash.  Neurological: Negative for dizziness and loss of consciousness.  Endo/Heme/Allergies: Bruises/bleeds easily.  Psychiatric/Behavioral: Negative for depression and memory loss. The patient is not nervous/anxious.     Health Maintenance  Topic Date Due  . TETANUS/TDAP  03/20/2025  . INFLUENZA VACCINE  Completed  . PNA vac Low Risk Adult  Completed    Physical Exam: Vitals:   12/15/17  1136  BP: 136/60  Pulse: 71  Temp: 97.8 F (36.6 C)  TempSrc: Oral  SpO2: 94%  Weight: 255 lb (115.7 kg)  Height: 5\' 7"  (1.702 m)   Body mass index is 39.94 kg/m. Physical Exam  Constitutional: He is oriented to person, place, and time.  Morbidly obese white male, ambulates with 4 wheeled rolling walker with seat that folds up  HENT:  Head: Normocephalic and atraumatic.  Cardiovascular: Normal rate, regular rhythm, normal heart sounds and intact distal pulses.  Pulmonary/Chest: Effort normal and breath sounds normal. He has no wheezes.  Not dyspneic now like he was last visit with me  Abdominal: Bowel sounds are normal.  Musculoskeletal:  Ambulates with rollator walker  Neurological: He is alert and oriented to person, place, and time.  Skin: Skin is warm and dry. Capillary refill takes less than 2 seconds. There is pallor.  Psychiatric: He has a normal mood and affect.    Labs reviewed: Basic Metabolic Panel: Recent Labs    12/21/16 0931  NA 142  K 4.2  CL 108  CO2 26  GLUCOSE 109*  BUN 38*  CREATININE 1.56*  CALCIUM 9.4     Liver Function Tests: No results for input(s): AST, ALT, ALKPHOS, BILITOT, PROT, ALBUMIN in the last 8760 hours. No results for input(s): LIPASE, AMYLASE in the last 8760 hours. No results for input(s): AMMONIA in the last 8760 hours. CBC: Recent Labs    09/28/17 1133 10/26/17 1024 11/30/17 1042  WBC 8.4 6.9 9.6  NEUTROABS 6.5 5.5 7.9*  HGB 8.6* 8.9* 8.5*  HCT 28.3* 27.9* 27.8*  MCV 89.3 93.5 97.2  PLT 205 254 285   Lipid Panel: No results for input(s): CHOL, HDL, LDLCALC, TRIG, CHOLHDL, LDLDIRECT in the last 8760 hours. Lab Results  Component Value Date   HGBA1C 5.2 11/19/2016   Lab Results  Component Value Date   TSH 3.98 11/21/2015    Reviewed labs done by Dr. Alen Blew for his anemia, but no other recent labs  Assessment/Plan 1. Greater trochanteric bursitis, left -better most of the time, not using regular medication for pain, comes and goes  2. Essential hypertension -bp satisfactory today, cont toprol-xl  3. Hypothyroidism, unspecified type -continues on levothyroxine 75mcg daily--follows between here and [place] so it's very challenging to get all of his records all of the time  4. Chronic obstructive airway disease with asthma (HCC) -cont bevespi with prn albuterol which appears to have helped based on his appearance when walking--much less DOE even though h/h not trending up over 8.5 despite aranesp  5. Anemia in stage 3 chronic kidney disease ([place]) -f/u renal function with next labs at hematology if possible--if not, come to get done [facility] before leaving for [place]  6. Morbid obesity due to excess calories ([place]) /complicated by hbp/ hyperlidemia -ongoing, unable to put his socks on so doesn't wear any; BMI near 40 now; not very active due to arthritic pains and malaise due to his anemia  7. Malignant neoplasm of prostate ([place]) -s/p xrt and hormone injections, ideally should have bone density to eval for osteoporosis--may arrange when returns from [place] if he does not  get it done there   8. Venous stasis dermatitis of both lower extremities -ongoing, counseled on elevating legs which he says he does, but refuses compression hose  9. Flexural eczema -right arm especially--reports allergy to grass that brought it on--using cerave cream, but would benefit form steroid cream or eucrisa--tells me he's going to see  derm about this, the "wart" on the back of his left hand and the cutaneous horn on his left upper chest  Labs/tests ordered:  Cmp, flp 4/2  Next appt:  F/u 06/22/18 at [place]. Kevin Kane, D.O. [facility] [address], [place] [address] Cell Phone (Mon-Fri 8am-5pm):  [phone] On Call:  [phone] & follow prompts after 5pm & weekends Office Phone:  [phone] Office Fax:  [phone]

## 2017-12-21 ENCOUNTER — Inpatient Hospital Stay: Payer: Medicare Other | Attending: Oncology

## 2017-12-21 ENCOUNTER — Inpatient Hospital Stay: Payer: Medicare Other

## 2017-12-21 VITALS — BP 143/41 | HR 70 | Resp 18

## 2017-12-21 DIAGNOSIS — E785 Hyperlipidemia, unspecified: Secondary | ICD-10-CM | POA: Diagnosis not present

## 2017-12-21 DIAGNOSIS — N183 Chronic kidney disease, stage 3 (moderate): Secondary | ICD-10-CM | POA: Insufficient documentation

## 2017-12-21 DIAGNOSIS — D631 Anemia in chronic kidney disease: Secondary | ICD-10-CM | POA: Diagnosis not present

## 2017-12-21 DIAGNOSIS — C61 Malignant neoplasm of prostate: Secondary | ICD-10-CM | POA: Diagnosis not present

## 2017-12-21 DIAGNOSIS — D638 Anemia in other chronic diseases classified elsewhere: Secondary | ICD-10-CM | POA: Insufficient documentation

## 2017-12-21 DIAGNOSIS — D649 Anemia, unspecified: Secondary | ICD-10-CM

## 2017-12-21 LAB — BASIC METABOLIC PANEL
BUN: 20 (ref 4–21)
Creatinine: 1.8 — AB (ref 0.6–1.3)
Glucose: 104
POTASSIUM: 4.7 (ref 3.4–5.3)
Sodium: 148 — AB (ref 137–147)

## 2017-12-21 LAB — CBC WITH DIFFERENTIAL/PLATELET
BASOS ABS: 0 10*3/uL (ref 0.0–0.1)
Basophils Relative: 1 %
EOS ABS: 0.2 10*3/uL (ref 0.0–0.5)
EOS PCT: 2 %
HCT: 26 % — ABNORMAL LOW (ref 38.4–49.9)
Hemoglobin: 8.2 g/dL — ABNORMAL LOW (ref 13.0–17.1)
LYMPHS PCT: 5 %
Lymphs Abs: 0.5 10*3/uL — ABNORMAL LOW (ref 0.9–3.3)
MCH: 28.2 pg (ref 27.2–33.4)
MCHC: 31.4 g/dL — ABNORMAL LOW (ref 32.0–36.0)
MCV: 89.7 fL (ref 79.3–98.0)
MONO ABS: 0.9 10*3/uL (ref 0.1–0.9)
Monocytes Relative: 11 %
Neutro Abs: 7.1 10*3/uL — ABNORMAL HIGH (ref 1.5–6.5)
Neutrophils Relative %: 81 %
PLATELETS: 385 10*3/uL (ref 140–400)
RBC: 2.89 MIL/uL — AB (ref 4.20–5.82)
RDW: 18.1 % — AB (ref 11.0–14.6)
WBC: 8.7 10*3/uL (ref 4.0–10.3)

## 2017-12-21 LAB — HEPATIC FUNCTION PANEL
ALT: 22 (ref 10–40)
AST: 22 (ref 14–40)
Alkaline Phosphatase: 101 (ref 25–125)
Bilirubin, Total: 0.2

## 2017-12-21 LAB — LIPID PANEL
CHOLESTEROL: 152 (ref 0–200)
HDL: 61 (ref 35–70)
LDL CALC: 78
Triglycerides: 66 (ref 40–160)

## 2017-12-21 MED ORDER — DARBEPOETIN ALFA 300 MCG/0.6ML IJ SOSY
PREFILLED_SYRINGE | INTRAMUSCULAR | Status: AC
Start: 2017-12-21 — End: 2017-12-21
  Filled 2017-12-21: qty 0.6

## 2017-12-21 MED ORDER — DARBEPOETIN ALFA 300 MCG/0.6ML IJ SOSY
300.0000 ug | PREFILLED_SYRINGE | Freq: Once | INTRAMUSCULAR | Status: AC
  Administered 2017-12-21: 300 ug via SUBCUTANEOUS

## 2017-12-27 ENCOUNTER — Encounter: Payer: Self-pay | Admitting: Internal Medicine

## 2017-12-29 ENCOUNTER — Telehealth: Payer: Self-pay | Admitting: *Deleted

## 2017-12-29 NOTE — Telephone Encounter (Signed)
Cholesterol is good.  Kidney function was slightly worse--this is affected by hydration status.  He should be drinking plenty of water.  Please tell him these results.  He may want a copy?

## 2017-12-29 NOTE — Telephone Encounter (Signed)
Patient called and stated that he was seen 2 weeks ago and Dr. Mariea Clonts told him someone would call him with his lab results but states he has not heard from anyone yet. Patient wants someone to call him with the results [phone]

## 2017-12-30 NOTE — Telephone Encounter (Signed)
Spoke with patient and advised results   

## 2018-01-11 ENCOUNTER — Other Ambulatory Visit: Payer: Medicare Other

## 2018-01-11 ENCOUNTER — Ambulatory Visit: Payer: Medicare Other

## 2018-01-12 ENCOUNTER — Inpatient Hospital Stay (HOSPITAL_BASED_OUTPATIENT_CLINIC_OR_DEPARTMENT_OTHER): Payer: Medicare Other | Admitting: Oncology

## 2018-01-12 ENCOUNTER — Telehealth: Payer: Self-pay | Admitting: Oncology

## 2018-01-12 ENCOUNTER — Inpatient Hospital Stay: Payer: Medicare Other

## 2018-01-12 ENCOUNTER — Other Ambulatory Visit: Payer: Self-pay | Admitting: *Deleted

## 2018-01-12 VITALS — BP 156/39 | HR 97 | Temp 97.7°F | Resp 22 | Ht 67.0 in | Wt 253.4 lb

## 2018-01-12 DIAGNOSIS — D649 Anemia, unspecified: Secondary | ICD-10-CM

## 2018-01-12 DIAGNOSIS — N183 Chronic kidney disease, stage 3 unspecified: Secondary | ICD-10-CM

## 2018-01-12 DIAGNOSIS — D638 Anemia in other chronic diseases classified elsewhere: Secondary | ICD-10-CM

## 2018-01-12 DIAGNOSIS — D631 Anemia in chronic kidney disease: Secondary | ICD-10-CM

## 2018-01-12 DIAGNOSIS — C61 Malignant neoplasm of prostate: Secondary | ICD-10-CM | POA: Diagnosis not present

## 2018-01-12 LAB — CBC WITH DIFFERENTIAL/PLATELET
BASOS ABS: 0 10*3/uL (ref 0.0–0.1)
Basophils Relative: 0 %
EOS PCT: 3 %
Eosinophils Absolute: 0.2 10*3/uL (ref 0.0–0.5)
HCT: 21.6 % — ABNORMAL LOW (ref 38.4–49.9)
Hemoglobin: 6.8 g/dL — CL (ref 13.0–17.1)
LYMPHS ABS: 0.4 10*3/uL — AB (ref 0.9–3.3)
LYMPHS PCT: 4 %
MCH: 26.1 pg — AB (ref 27.2–33.4)
MCHC: 31.4 g/dL — AB (ref 32.0–36.0)
MCV: 83.1 fL (ref 79.3–98.0)
MONO ABS: 0.8 10*3/uL (ref 0.1–0.9)
MONOS PCT: 9 %
Neutro Abs: 7.5 10*3/uL — ABNORMAL HIGH (ref 1.5–6.5)
Neutrophils Relative %: 84 %
PLATELETS: 300 10*3/uL (ref 140–400)
RBC: 2.6 MIL/uL — ABNORMAL LOW (ref 4.20–5.82)
RDW: 18.7 % — AB (ref 11.0–14.6)
WBC: 8.9 10*3/uL (ref 4.0–10.3)

## 2018-01-12 LAB — PREPARE RBC (CROSSMATCH)

## 2018-01-12 LAB — ABO/RH: ABO/RH(D): O POS

## 2018-01-12 MED ORDER — DARBEPOETIN ALFA 300 MCG/0.6ML IJ SOSY
PREFILLED_SYRINGE | INTRAMUSCULAR | Status: AC
  Filled 2018-01-12: qty 0.6

## 2018-01-12 MED ORDER — DARBEPOETIN ALFA 300 MCG/0.6ML IJ SOSY
300.0000 ug | PREFILLED_SYRINGE | Freq: Once | INTRAMUSCULAR | Status: AC
  Administered 2018-01-12: 300 ug via SUBCUTANEOUS

## 2018-01-12 NOTE — Telephone Encounter (Signed)
Scheduled appt per 4/24 los - patient aware times - did not want print out.

## 2018-01-12 NOTE — Progress Notes (Signed)
Hematology and Oncology Follow Up Visit  Kevin Kane [id number] 11/17/1929 84 y.o. 01/12/2018 12:30 PM Kane, Kevin L, DOReed, Kevin L, DO   Principle Diagnosis: 84 year old with the:   1. T2c prostate cancer diagnosed in September 2017. He presented with a PSA of 15.9 and Gleason score 4+5 = 9.   2.  Anemia of renal disease with element of iron deficiency.   Prior Therapy:   He is status post androgen deprivation therapy followed by radiation therapy completed in December 2017. Androgen deprivation therapy to be completed in 2019.  He is status post a bone marrow biopsy in April 2018 which did not show any myelodysplasia.  Current therapy: Aranesp 300 mcg every 3 weeks started in October 2018.  He received packed red cell transfusion as needed.  Interim History:  Kevin Kane is here for a follow-up.  Since the last visit, he has reported increased symptoms of dyspnea on exertion.  He reports feeling reasonably well at rest but does report increased fatigue and wheezing as well as shortness of breath when he ambulates.  He denies any dizziness, falls or syncope.  He denies any hematochezia or melena.  His appetite remained reasonable without any changes in his activity level or performance status.  He does not report any headaches, blurry vision, syncope or seizures.  He does not report any neuropathy.  He does not report any fevers, chills, sweats or weight loss. He he denied any palpitation, orthopnea or leg edema. He does not report any cough, wheezing or hemoptysis. He does not report any nausea, vomiting or abdominal pain. He does not report any frequency urgency or hesitancy. He does not report any hematuria or dysuria. He does not report any skeletal complaints.  He does not report any arthralgias or myalgias.  He does not report any mood changes remaining review of systems is negative.    Medications: I have reviewed the patient's current medications.  Current Outpatient  Medications  Medication Sig Dispense Refill  . acyclovir (ZOVIRAX) 400 MG tablet Take 400 mg 5 (five) times daily by mouth.    Marland Kitchen albuterol (PROAIR HFA) 108 (90 Base) MCG/ACT inhaler Inhale 2 puffs into the lungs every 6 (six) hours as needed for wheezing or shortness of breath.    Marland Kitchen aspirin 81 MG tablet Take 81 mg by mouth daily.    Marland Kitchen atorvastatin (LIPITOR) 80 MG tablet Take 80 mg by mouth every morning. Take one daily for cholesterol    . bicalutamide (CASODEX) 50 MG tablet Take 50 mg by mouth daily.     . calcium citrate (CALCITRATE - DOSED IN MG ELEMENTAL CALCIUM) 950 MG tablet Take 200 mg of elemental calcium daily by mouth.    . Glycopyrrolate-Formoterol (BEVESPI AEROSPHERE) 9-4.8 MCG/ACT AERO Inhale 2 puffs into the lungs 2 (two) times daily. 1 Inhaler 11  . levothyroxine (SYNTHROID, LEVOTHROID) 25 MCG tablet Take one tablet by mouth once daily before breakfast 90 tablet 1  . metoprolol succinate (TOPROL-XL) 50 MG 24 hr tablet Take 50 mg by mouth every morning. Take one tablet daily for blood pressure    . Multiple Vitamins-Minerals (CENTRUM SILVER ADULT 50+) TABS Take 1 tablet by mouth every morning.    . tamsulosin (FLOMAX) 0.4 MG CAPS capsule Take 0.4 mg by mouth daily after breakfast. Reported on 11/14/2015     No current facility-administered medications for this visit.      Allergies:  Allergies  Allergen Reactions  . Ketoconazole Other (See Comments)  Adverse skin reaction-- legs turned red and purple    Past Medical History, Surgical history, Social history reviewed without any changes.  He denied any smoking or alcohol abuse.  Review of Systems:  Physical Exam: Blood pressure (!) 156/39, pulse 97, temperature 97.7 F (36.5 C), temperature source Oral, resp. rate (!) 22, height 5' 7"  (1.702 m), weight 253 lb 6.4 oz (114.9 kg), SpO2 98 %. ECOG: 1 General appearance: Comfortable appearing gentleman without distress.   Head: Normocephalic without any  abnormalities. Eyes: Pupils are equal and round reactive to light. Oral mucosa: No oral thrush or ulcers. Lymph nodes: No cervical, supraclavicular or axillary lymphadenopathy. Heart: Regular rate and rhythm without any murmurs or gallops. Lung: Clear to auscultation without any rhonchi, wheezes or dullness to percussion. Abdomin: Soft, nontender without any rebound or guarding. Skin: No petechiae or rash. Musculoskeletal: No clubbing or cyanosis.  Lab Results: Lab Results  Component Value Date   WBC 8.9 01/12/2018   HGB 6.8 (LL) 01/12/2018   HCT 21.6 (L) 01/12/2018   MCV 83.1 01/12/2018   PLT 300 01/12/2018     Chemistry      Component Value Date/Time   NA 148 (A) 12/21/2017   NA 146 (H) 11/26/2016 1156   K 4.7 12/21/2017   K 4.1 11/26/2016 1156   CL 108 12/21/2016 0931   CO2 26 12/21/2016 0931   CO2 27 11/26/2016 1156   BUN 20 12/21/2017   BUN 32.3 (H) 11/26/2016 1156   CREATININE 1.8 (A) 12/21/2017   CREATININE 1.56 (H) 12/21/2016 0931   CREATININE 1.8 (H) 11/26/2016 1156   GLU 104 12/21/2017      Component Value Date/Time   CALCIUM 9.4 12/21/2016 0931   CALCIUM 9.7 11/26/2016 1156   ALKPHOS 101 12/21/2017   ALKPHOS 102 11/26/2016 1156   AST 22 12/21/2017   AST 20 11/26/2016 1156   ALT 22 12/21/2017   ALT 19 11/26/2016 1156   BILITOT 0.52 11/26/2016 1156       Impression and Plan:  84 year old gentleman with the following issues:  1.Prostate cancer diagnosed in August 2017 and found to have a T2c organ confined disease.  He is status post definitive therapy with radiation and androgen deprivation without any evidence of recurrent disease.  Plan is to continue with active surveillance at this time.   2.  Multifactorial anemia: He has anemia of renal insufficiency as well as chronic disease.  Bone marrow biopsy in 2018 did not show evidence of myelodysplasia.  Plan is to continue with Aranesp at 300 mcg every 3 weeks and proceed with transfusion as needed.   His hemoglobin has declined at this point and he is mildly symptomatic.  Risks and benefits of packed red cell transfusion was discussed and is agreeable to proceed with at least 1 unit of packed red cells.  He is planning to travel to [place] where he spends the summer every year and he has a follow-up with hematology as well.    3. Follow-up: Will be in September 2019 upon his return from [place].  15 minutes was spent face-to-face today and greater than 50% spent counseling, education and coordinating his care.   Zola Button, MD 4/24/201912:30 PM

## 2018-01-13 ENCOUNTER — Inpatient Hospital Stay: Payer: Medicare Other

## 2018-01-13 DIAGNOSIS — C61 Malignant neoplasm of prostate: Secondary | ICD-10-CM | POA: Diagnosis not present

## 2018-01-13 DIAGNOSIS — N183 Chronic kidney disease, stage 3 unspecified: Secondary | ICD-10-CM

## 2018-01-13 DIAGNOSIS — D631 Anemia in chronic kidney disease: Secondary | ICD-10-CM | POA: Diagnosis not present

## 2018-01-13 DIAGNOSIS — D638 Anemia in other chronic diseases classified elsewhere: Secondary | ICD-10-CM | POA: Diagnosis not present

## 2018-01-13 MED ORDER — DIPHENHYDRAMINE HCL 25 MG PO CAPS
25.0000 mg | ORAL_CAPSULE | Freq: Once | ORAL | Status: AC
  Administered 2018-01-13: 25 mg via ORAL

## 2018-01-13 MED ORDER — ACETAMINOPHEN 325 MG PO TABS
650.0000 mg | ORAL_TABLET | Freq: Once | ORAL | Status: AC
  Administered 2018-01-13: 650 mg via ORAL

## 2018-01-13 MED ORDER — DIPHENHYDRAMINE HCL 25 MG PO CAPS
ORAL_CAPSULE | ORAL | Status: AC
  Filled 2018-01-13: qty 1

## 2018-01-13 MED ORDER — ACETAMINOPHEN 325 MG PO TABS
ORAL_TABLET | ORAL | Status: AC
  Filled 2018-01-13: qty 2

## 2018-01-13 MED ORDER — SODIUM CHLORIDE 0.9 % IV SOLN
Freq: Once | INTRAVENOUS | Status: AC
  Administered 2018-01-13: 14:00:00 via INTRAVENOUS

## 2018-01-13 NOTE — Patient Instructions (Signed)

## 2018-01-13 NOTE — Progress Notes (Signed)
Dr. Alen Blew made aware of patient's vital signs. Proceed with treatment as scheduled.

## 2018-01-14 LAB — BPAM RBC
BLOOD PRODUCT EXPIRATION DATE: 201905182359
ISSUE DATE / TIME: 201904251439
Unit Type and Rh: 5100

## 2018-01-14 LAB — TYPE AND SCREEN
ABO/RH(D): O POS
ANTIBODY SCREEN: NEGATIVE
UNIT DIVISION: 0

## 2018-01-21 DIAGNOSIS — D631 Anemia in chronic kidney disease: Secondary | ICD-10-CM | POA: Diagnosis not present

## 2018-01-21 DIAGNOSIS — D500 Iron deficiency anemia secondary to blood loss (chronic): Secondary | ICD-10-CM | POA: Diagnosis not present

## 2018-01-21 DIAGNOSIS — D509 Iron deficiency anemia, unspecified: Secondary | ICD-10-CM | POA: Diagnosis not present

## 2018-01-21 DIAGNOSIS — Z923 Personal history of irradiation: Secondary | ICD-10-CM | POA: Diagnosis not present

## 2018-01-21 DIAGNOSIS — Z79899 Other long term (current) drug therapy: Secondary | ICD-10-CM | POA: Diagnosis not present

## 2018-01-21 DIAGNOSIS — N183 Chronic kidney disease, stage 3 (moderate): Secondary | ICD-10-CM | POA: Diagnosis not present

## 2018-01-21 DIAGNOSIS — Z8546 Personal history of malignant neoplasm of prostate: Secondary | ICD-10-CM | POA: Diagnosis not present

## 2018-01-21 DIAGNOSIS — Z9221 Personal history of antineoplastic chemotherapy: Secondary | ICD-10-CM | POA: Diagnosis not present

## 2018-01-25 DIAGNOSIS — L821 Other seborrheic keratosis: Secondary | ICD-10-CM | POA: Diagnosis not present

## 2018-01-25 DIAGNOSIS — C44629 Squamous cell carcinoma of skin of left upper limb, including shoulder: Secondary | ICD-10-CM | POA: Diagnosis not present

## 2018-01-25 DIAGNOSIS — L570 Actinic keratosis: Secondary | ICD-10-CM | POA: Diagnosis not present

## 2018-01-25 DIAGNOSIS — L84 Corns and callosities: Secondary | ICD-10-CM | POA: Diagnosis not present

## 2018-01-25 DIAGNOSIS — D1801 Hemangioma of skin and subcutaneous tissue: Secondary | ICD-10-CM | POA: Diagnosis not present

## 2018-01-26 DIAGNOSIS — D631 Anemia in chronic kidney disease: Secondary | ICD-10-CM | POA: Diagnosis not present

## 2018-01-26 DIAGNOSIS — D509 Iron deficiency anemia, unspecified: Secondary | ICD-10-CM | POA: Diagnosis not present

## 2018-01-26 DIAGNOSIS — N183 Chronic kidney disease, stage 3 (moderate): Secondary | ICD-10-CM | POA: Diagnosis not present

## 2018-01-26 DIAGNOSIS — Z923 Personal history of irradiation: Secondary | ICD-10-CM | POA: Diagnosis not present

## 2018-01-26 DIAGNOSIS — Z8546 Personal history of malignant neoplasm of prostate: Secondary | ICD-10-CM | POA: Diagnosis not present

## 2018-01-26 DIAGNOSIS — Z9221 Personal history of antineoplastic chemotherapy: Secondary | ICD-10-CM | POA: Diagnosis not present

## 2018-01-31 DIAGNOSIS — R5383 Other fatigue: Secondary | ICD-10-CM | POA: Diagnosis not present

## 2018-01-31 DIAGNOSIS — R0902 Hypoxemia: Secondary | ICD-10-CM | POA: Diagnosis not present

## 2018-01-31 DIAGNOSIS — I5043 Acute on chronic combined systolic (congestive) and diastolic (congestive) heart failure: Secondary | ICD-10-CM | POA: Diagnosis not present

## 2018-01-31 DIAGNOSIS — I130 Hypertensive heart and chronic kidney disease with heart failure and stage 1 through stage 4 chronic kidney disease, or unspecified chronic kidney disease: Secondary | ICD-10-CM | POA: Diagnosis not present

## 2018-01-31 DIAGNOSIS — N179 Acute kidney failure, unspecified: Secondary | ICD-10-CM | POA: Diagnosis not present

## 2018-01-31 DIAGNOSIS — I248 Other forms of acute ischemic heart disease: Secondary | ICD-10-CM | POA: Diagnosis not present

## 2018-01-31 DIAGNOSIS — I440 Atrioventricular block, first degree: Secondary | ICD-10-CM | POA: Diagnosis not present

## 2018-01-31 DIAGNOSIS — R0602 Shortness of breath: Secondary | ICD-10-CM | POA: Diagnosis not present

## 2018-01-31 DIAGNOSIS — J9601 Acute respiratory failure with hypoxia: Secondary | ICD-10-CM | POA: Diagnosis not present

## 2018-01-31 DIAGNOSIS — J441 Chronic obstructive pulmonary disease with (acute) exacerbation: Secondary | ICD-10-CM | POA: Diagnosis not present

## 2018-01-31 DIAGNOSIS — R0603 Acute respiratory distress: Secondary | ICD-10-CM | POA: Diagnosis not present

## 2018-01-31 DIAGNOSIS — R000 Tachycardia, unspecified: Secondary | ICD-10-CM | POA: Diagnosis not present

## 2018-01-31 DIAGNOSIS — R0789 Other chest pain: Secondary | ICD-10-CM | POA: Diagnosis not present

## 2018-02-01 DIAGNOSIS — D649 Anemia, unspecified: Secondary | ICD-10-CM | POA: Diagnosis not present

## 2018-02-01 DIAGNOSIS — R784 Finding of other drugs of addictive potential in blood: Secondary | ICD-10-CM | POA: Diagnosis not present

## 2018-02-01 DIAGNOSIS — I5030 Unspecified diastolic (congestive) heart failure: Secondary | ICD-10-CM | POA: Diagnosis not present

## 2018-02-01 DIAGNOSIS — I130 Hypertensive heart and chronic kidney disease with heart failure and stage 1 through stage 4 chronic kidney disease, or unspecified chronic kidney disease: Secondary | ICD-10-CM | POA: Diagnosis present

## 2018-02-01 DIAGNOSIS — J810 Acute pulmonary edema: Secondary | ICD-10-CM | POA: Diagnosis not present

## 2018-02-01 DIAGNOSIS — J209 Acute bronchitis, unspecified: Secondary | ICD-10-CM | POA: Diagnosis present

## 2018-02-01 DIAGNOSIS — R0602 Shortness of breath: Secondary | ICD-10-CM | POA: Diagnosis not present

## 2018-02-01 DIAGNOSIS — E785 Hyperlipidemia, unspecified: Secondary | ICD-10-CM | POA: Diagnosis present

## 2018-02-01 DIAGNOSIS — M069 Rheumatoid arthritis, unspecified: Secondary | ICD-10-CM | POA: Diagnosis present

## 2018-02-01 DIAGNOSIS — Z923 Personal history of irradiation: Secondary | ICD-10-CM | POA: Diagnosis not present

## 2018-02-01 DIAGNOSIS — I2510 Atherosclerotic heart disease of native coronary artery without angina pectoris: Secondary | ICD-10-CM | POA: Diagnosis not present

## 2018-02-01 DIAGNOSIS — J440 Chronic obstructive pulmonary disease with acute lower respiratory infection: Secondary | ICD-10-CM | POA: Diagnosis present

## 2018-02-01 DIAGNOSIS — J4541 Moderate persistent asthma with (acute) exacerbation: Secondary | ICD-10-CM | POA: Diagnosis present

## 2018-02-01 DIAGNOSIS — I5043 Acute on chronic combined systolic (congestive) and diastolic (congestive) heart failure: Secondary | ICD-10-CM | POA: Diagnosis not present

## 2018-02-01 DIAGNOSIS — R748 Abnormal levels of other serum enzymes: Secondary | ICD-10-CM | POA: Diagnosis not present

## 2018-02-01 DIAGNOSIS — I248 Other forms of acute ischemic heart disease: Secondary | ICD-10-CM | POA: Diagnosis present

## 2018-02-01 DIAGNOSIS — D509 Iron deficiency anemia, unspecified: Secondary | ICD-10-CM | POA: Diagnosis present

## 2018-02-01 DIAGNOSIS — N179 Acute kidney failure, unspecified: Secondary | ICD-10-CM | POA: Diagnosis present

## 2018-02-01 DIAGNOSIS — I509 Heart failure, unspecified: Secondary | ICD-10-CM | POA: Diagnosis not present

## 2018-02-01 DIAGNOSIS — J449 Chronic obstructive pulmonary disease, unspecified: Secondary | ICD-10-CM | POA: Diagnosis not present

## 2018-02-01 DIAGNOSIS — Z8546 Personal history of malignant neoplasm of prostate: Secondary | ICD-10-CM | POA: Diagnosis not present

## 2018-02-01 DIAGNOSIS — N183 Chronic kidney disease, stage 3 (moderate): Secondary | ICD-10-CM | POA: Diagnosis not present

## 2018-02-01 DIAGNOSIS — I252 Old myocardial infarction: Secondary | ICD-10-CM | POA: Diagnosis not present

## 2018-02-01 DIAGNOSIS — E669 Obesity, unspecified: Secondary | ICD-10-CM | POA: Diagnosis present

## 2018-02-01 DIAGNOSIS — Z7982 Long term (current) use of aspirin: Secondary | ICD-10-CM | POA: Diagnosis not present

## 2018-02-01 DIAGNOSIS — Z96642 Presence of left artificial hip joint: Secondary | ICD-10-CM | POA: Diagnosis present

## 2018-02-01 DIAGNOSIS — D631 Anemia in chronic kidney disease: Secondary | ICD-10-CM | POA: Diagnosis not present

## 2018-02-01 DIAGNOSIS — Z79890 Hormone replacement therapy (postmenopausal): Secondary | ICD-10-CM | POA: Diagnosis not present

## 2018-02-01 DIAGNOSIS — N19 Unspecified kidney failure: Secondary | ICD-10-CM | POA: Diagnosis not present

## 2018-02-01 DIAGNOSIS — Z87891 Personal history of nicotine dependence: Secondary | ICD-10-CM | POA: Diagnosis not present

## 2018-02-01 DIAGNOSIS — J984 Other disorders of lung: Secondary | ICD-10-CM | POA: Diagnosis present

## 2018-02-01 DIAGNOSIS — J9601 Acute respiratory failure with hypoxia: Secondary | ICD-10-CM | POA: Diagnosis not present

## 2018-02-01 DIAGNOSIS — E039 Hypothyroidism, unspecified: Secondary | ICD-10-CM | POA: Diagnosis present

## 2018-02-01 DIAGNOSIS — J441 Chronic obstructive pulmonary disease with (acute) exacerbation: Secondary | ICD-10-CM | POA: Diagnosis not present

## 2018-02-06 DIAGNOSIS — I130 Hypertensive heart and chronic kidney disease with heart failure and stage 1 through stage 4 chronic kidney disease, or unspecified chronic kidney disease: Secondary | ICD-10-CM | POA: Diagnosis not present

## 2018-02-06 DIAGNOSIS — Z9181 History of falling: Secondary | ICD-10-CM | POA: Diagnosis not present

## 2018-02-06 DIAGNOSIS — I2510 Atherosclerotic heart disease of native coronary artery without angina pectoris: Secondary | ICD-10-CM | POA: Diagnosis not present

## 2018-02-06 DIAGNOSIS — J441 Chronic obstructive pulmonary disease with (acute) exacerbation: Secondary | ICD-10-CM | POA: Diagnosis not present

## 2018-02-06 DIAGNOSIS — E039 Hypothyroidism, unspecified: Secondary | ICD-10-CM | POA: Diagnosis not present

## 2018-02-06 DIAGNOSIS — J9601 Acute respiratory failure with hypoxia: Secondary | ICD-10-CM | POA: Diagnosis not present

## 2018-02-06 DIAGNOSIS — I5043 Acute on chronic combined systolic (congestive) and diastolic (congestive) heart failure: Secondary | ICD-10-CM | POA: Diagnosis not present

## 2018-02-06 DIAGNOSIS — Z87891 Personal history of nicotine dependence: Secondary | ICD-10-CM | POA: Diagnosis not present

## 2018-02-06 DIAGNOSIS — Z8546 Personal history of malignant neoplasm of prostate: Secondary | ICD-10-CM | POA: Diagnosis not present

## 2018-02-06 DIAGNOSIS — Z9981 Dependence on supplemental oxygen: Secondary | ICD-10-CM | POA: Diagnosis not present

## 2018-02-06 DIAGNOSIS — Z7952 Long term (current) use of systemic steroids: Secondary | ICD-10-CM | POA: Diagnosis not present

## 2018-02-06 DIAGNOSIS — J4540 Moderate persistent asthma, uncomplicated: Secondary | ICD-10-CM | POA: Diagnosis not present

## 2018-02-06 DIAGNOSIS — N183 Chronic kidney disease, stage 3 (moderate): Secondary | ICD-10-CM | POA: Diagnosis not present

## 2018-02-06 DIAGNOSIS — E669 Obesity, unspecified: Secondary | ICD-10-CM | POA: Diagnosis not present

## 2018-02-06 DIAGNOSIS — D509 Iron deficiency anemia, unspecified: Secondary | ICD-10-CM | POA: Diagnosis not present

## 2018-02-08 DIAGNOSIS — N183 Chronic kidney disease, stage 3 (moderate): Secondary | ICD-10-CM | POA: Diagnosis not present

## 2018-02-08 DIAGNOSIS — J441 Chronic obstructive pulmonary disease with (acute) exacerbation: Secondary | ICD-10-CM | POA: Diagnosis not present

## 2018-02-08 DIAGNOSIS — I130 Hypertensive heart and chronic kidney disease with heart failure and stage 1 through stage 4 chronic kidney disease, or unspecified chronic kidney disease: Secondary | ICD-10-CM | POA: Diagnosis not present

## 2018-02-08 DIAGNOSIS — I5043 Acute on chronic combined systolic (congestive) and diastolic (congestive) heart failure: Secondary | ICD-10-CM | POA: Diagnosis not present

## 2018-02-08 DIAGNOSIS — J9601 Acute respiratory failure with hypoxia: Secondary | ICD-10-CM | POA: Diagnosis not present

## 2018-02-08 DIAGNOSIS — J4540 Moderate persistent asthma, uncomplicated: Secondary | ICD-10-CM | POA: Diagnosis not present

## 2018-02-09 DIAGNOSIS — J449 Chronic obstructive pulmonary disease, unspecified: Secondary | ICD-10-CM | POA: Diagnosis not present

## 2018-02-09 DIAGNOSIS — N183 Chronic kidney disease, stage 3 (moderate): Secondary | ICD-10-CM | POA: Diagnosis not present

## 2018-02-09 DIAGNOSIS — J984 Other disorders of lung: Secondary | ICD-10-CM | POA: Diagnosis not present

## 2018-02-09 DIAGNOSIS — J4540 Moderate persistent asthma, uncomplicated: Secondary | ICD-10-CM | POA: Diagnosis not present

## 2018-02-09 DIAGNOSIS — Z9189 Other specified personal risk factors, not elsewhere classified: Secondary | ICD-10-CM | POA: Diagnosis not present

## 2018-02-09 DIAGNOSIS — R0609 Other forms of dyspnea: Secondary | ICD-10-CM | POA: Diagnosis not present

## 2018-02-09 DIAGNOSIS — J441 Chronic obstructive pulmonary disease with (acute) exacerbation: Secondary | ICD-10-CM | POA: Diagnosis not present

## 2018-02-09 DIAGNOSIS — D631 Anemia in chronic kidney disease: Secondary | ICD-10-CM | POA: Diagnosis not present

## 2018-02-09 DIAGNOSIS — E039 Hypothyroidism, unspecified: Secondary | ICD-10-CM | POA: Diagnosis not present

## 2018-02-09 DIAGNOSIS — I5030 Unspecified diastolic (congestive) heart failure: Secondary | ICD-10-CM | POA: Diagnosis not present

## 2018-02-12 DIAGNOSIS — N183 Chronic kidney disease, stage 3 (moderate): Secondary | ICD-10-CM | POA: Diagnosis not present

## 2018-02-12 DIAGNOSIS — J9601 Acute respiratory failure with hypoxia: Secondary | ICD-10-CM | POA: Diagnosis not present

## 2018-02-12 DIAGNOSIS — J4540 Moderate persistent asthma, uncomplicated: Secondary | ICD-10-CM | POA: Diagnosis not present

## 2018-02-12 DIAGNOSIS — J441 Chronic obstructive pulmonary disease with (acute) exacerbation: Secondary | ICD-10-CM | POA: Diagnosis not present

## 2018-02-12 DIAGNOSIS — I130 Hypertensive heart and chronic kidney disease with heart failure and stage 1 through stage 4 chronic kidney disease, or unspecified chronic kidney disease: Secondary | ICD-10-CM | POA: Diagnosis not present

## 2018-02-12 DIAGNOSIS — I5043 Acute on chronic combined systolic (congestive) and diastolic (congestive) heart failure: Secondary | ICD-10-CM | POA: Diagnosis not present

## 2018-02-14 DIAGNOSIS — J441 Chronic obstructive pulmonary disease with (acute) exacerbation: Secondary | ICD-10-CM | POA: Diagnosis not present

## 2018-02-14 DIAGNOSIS — I5043 Acute on chronic combined systolic (congestive) and diastolic (congestive) heart failure: Secondary | ICD-10-CM | POA: Diagnosis not present

## 2018-02-14 DIAGNOSIS — N183 Chronic kidney disease, stage 3 (moderate): Secondary | ICD-10-CM | POA: Diagnosis not present

## 2018-02-14 DIAGNOSIS — I130 Hypertensive heart and chronic kidney disease with heart failure and stage 1 through stage 4 chronic kidney disease, or unspecified chronic kidney disease: Secondary | ICD-10-CM | POA: Diagnosis not present

## 2018-02-14 DIAGNOSIS — J9601 Acute respiratory failure with hypoxia: Secondary | ICD-10-CM | POA: Diagnosis not present

## 2018-02-14 DIAGNOSIS — J4540 Moderate persistent asthma, uncomplicated: Secondary | ICD-10-CM | POA: Diagnosis not present

## 2018-02-15 DIAGNOSIS — J441 Chronic obstructive pulmonary disease with (acute) exacerbation: Secondary | ICD-10-CM | POA: Diagnosis not present

## 2018-02-15 DIAGNOSIS — I130 Hypertensive heart and chronic kidney disease with heart failure and stage 1 through stage 4 chronic kidney disease, or unspecified chronic kidney disease: Secondary | ICD-10-CM | POA: Diagnosis not present

## 2018-02-15 DIAGNOSIS — N183 Chronic kidney disease, stage 3 (moderate): Secondary | ICD-10-CM | POA: Diagnosis not present

## 2018-02-15 DIAGNOSIS — I5043 Acute on chronic combined systolic (congestive) and diastolic (congestive) heart failure: Secondary | ICD-10-CM | POA: Diagnosis not present

## 2018-02-15 DIAGNOSIS — J9601 Acute respiratory failure with hypoxia: Secondary | ICD-10-CM | POA: Diagnosis not present

## 2018-02-15 DIAGNOSIS — J4540 Moderate persistent asthma, uncomplicated: Secondary | ICD-10-CM | POA: Diagnosis not present

## 2018-02-17 DIAGNOSIS — J441 Chronic obstructive pulmonary disease with (acute) exacerbation: Secondary | ICD-10-CM | POA: Diagnosis not present

## 2018-02-17 DIAGNOSIS — J9601 Acute respiratory failure with hypoxia: Secondary | ICD-10-CM | POA: Diagnosis not present

## 2018-02-17 DIAGNOSIS — N183 Chronic kidney disease, stage 3 (moderate): Secondary | ICD-10-CM | POA: Diagnosis not present

## 2018-02-17 DIAGNOSIS — I130 Hypertensive heart and chronic kidney disease with heart failure and stage 1 through stage 4 chronic kidney disease, or unspecified chronic kidney disease: Secondary | ICD-10-CM | POA: Diagnosis not present

## 2018-02-17 DIAGNOSIS — J4540 Moderate persistent asthma, uncomplicated: Secondary | ICD-10-CM | POA: Diagnosis not present

## 2018-02-17 DIAGNOSIS — I5043 Acute on chronic combined systolic (congestive) and diastolic (congestive) heart failure: Secondary | ICD-10-CM | POA: Diagnosis not present

## 2018-02-23 DIAGNOSIS — I6523 Occlusion and stenosis of bilateral carotid arteries: Secondary | ICD-10-CM | POA: Diagnosis not present

## 2018-02-23 DIAGNOSIS — Z955 Presence of coronary angioplasty implant and graft: Secondary | ICD-10-CM | POA: Diagnosis not present

## 2018-02-23 DIAGNOSIS — I5032 Chronic diastolic (congestive) heart failure: Secondary | ICD-10-CM | POA: Diagnosis not present

## 2018-02-23 DIAGNOSIS — J9601 Acute respiratory failure with hypoxia: Secondary | ICD-10-CM | POA: Diagnosis not present

## 2018-02-23 DIAGNOSIS — N289 Disorder of kidney and ureter, unspecified: Secondary | ICD-10-CM | POA: Diagnosis not present

## 2018-02-23 DIAGNOSIS — Z08 Encounter for follow-up examination after completed treatment for malignant neoplasm: Secondary | ICD-10-CM | POA: Diagnosis not present

## 2018-02-23 DIAGNOSIS — L810 Postinflammatory hyperpigmentation: Secondary | ICD-10-CM | POA: Diagnosis not present

## 2018-02-23 DIAGNOSIS — Z85828 Personal history of other malignant neoplasm of skin: Secondary | ICD-10-CM | POA: Diagnosis not present

## 2018-02-23 DIAGNOSIS — J441 Chronic obstructive pulmonary disease with (acute) exacerbation: Secondary | ICD-10-CM | POA: Diagnosis not present

## 2018-02-23 DIAGNOSIS — E7800 Pure hypercholesterolemia, unspecified: Secondary | ICD-10-CM | POA: Diagnosis not present

## 2018-02-23 DIAGNOSIS — I2510 Atherosclerotic heart disease of native coronary artery without angina pectoris: Secondary | ICD-10-CM | POA: Diagnosis not present

## 2018-02-23 DIAGNOSIS — I252 Old myocardial infarction: Secondary | ICD-10-CM | POA: Diagnosis not present

## 2018-02-23 DIAGNOSIS — I4891 Unspecified atrial fibrillation: Secondary | ICD-10-CM | POA: Diagnosis not present

## 2018-02-24 DIAGNOSIS — J9601 Acute respiratory failure with hypoxia: Secondary | ICD-10-CM | POA: Diagnosis not present

## 2018-02-24 DIAGNOSIS — N183 Chronic kidney disease, stage 3 (moderate): Secondary | ICD-10-CM | POA: Diagnosis not present

## 2018-02-24 DIAGNOSIS — J4540 Moderate persistent asthma, uncomplicated: Secondary | ICD-10-CM | POA: Diagnosis not present

## 2018-02-24 DIAGNOSIS — I130 Hypertensive heart and chronic kidney disease with heart failure and stage 1 through stage 4 chronic kidney disease, or unspecified chronic kidney disease: Secondary | ICD-10-CM | POA: Diagnosis not present

## 2018-02-24 DIAGNOSIS — J441 Chronic obstructive pulmonary disease with (acute) exacerbation: Secondary | ICD-10-CM | POA: Diagnosis not present

## 2018-02-24 DIAGNOSIS — I5043 Acute on chronic combined systolic (congestive) and diastolic (congestive) heart failure: Secondary | ICD-10-CM | POA: Diagnosis not present

## 2018-02-25 DIAGNOSIS — C61 Malignant neoplasm of prostate: Secondary | ICD-10-CM | POA: Diagnosis not present

## 2018-02-25 DIAGNOSIS — N200 Calculus of kidney: Secondary | ICD-10-CM | POA: Diagnosis not present

## 2018-02-28 DIAGNOSIS — N289 Disorder of kidney and ureter, unspecified: Secondary | ICD-10-CM | POA: Diagnosis not present

## 2018-02-28 DIAGNOSIS — I2510 Atherosclerotic heart disease of native coronary artery without angina pectoris: Secondary | ICD-10-CM | POA: Diagnosis not present

## 2018-02-28 DIAGNOSIS — C61 Malignant neoplasm of prostate: Secondary | ICD-10-CM | POA: Diagnosis not present

## 2018-02-28 DIAGNOSIS — I4891 Unspecified atrial fibrillation: Secondary | ICD-10-CM | POA: Diagnosis not present

## 2018-02-28 DIAGNOSIS — E7800 Pure hypercholesterolemia, unspecified: Secondary | ICD-10-CM | POA: Diagnosis not present

## 2018-03-01 DIAGNOSIS — I5043 Acute on chronic combined systolic (congestive) and diastolic (congestive) heart failure: Secondary | ICD-10-CM | POA: Diagnosis not present

## 2018-03-01 DIAGNOSIS — J441 Chronic obstructive pulmonary disease with (acute) exacerbation: Secondary | ICD-10-CM | POA: Diagnosis not present

## 2018-03-01 DIAGNOSIS — J9601 Acute respiratory failure with hypoxia: Secondary | ICD-10-CM | POA: Diagnosis not present

## 2018-03-01 DIAGNOSIS — I130 Hypertensive heart and chronic kidney disease with heart failure and stage 1 through stage 4 chronic kidney disease, or unspecified chronic kidney disease: Secondary | ICD-10-CM | POA: Diagnosis not present

## 2018-03-01 DIAGNOSIS — N183 Chronic kidney disease, stage 3 (moderate): Secondary | ICD-10-CM | POA: Diagnosis not present

## 2018-03-01 DIAGNOSIS — J4540 Moderate persistent asthma, uncomplicated: Secondary | ICD-10-CM | POA: Diagnosis not present

## 2018-03-03 DIAGNOSIS — N183 Chronic kidney disease, stage 3 (moderate): Secondary | ICD-10-CM | POA: Diagnosis not present

## 2018-03-03 DIAGNOSIS — J4540 Moderate persistent asthma, uncomplicated: Secondary | ICD-10-CM | POA: Diagnosis not present

## 2018-03-03 DIAGNOSIS — J441 Chronic obstructive pulmonary disease with (acute) exacerbation: Secondary | ICD-10-CM | POA: Diagnosis not present

## 2018-03-03 DIAGNOSIS — J9601 Acute respiratory failure with hypoxia: Secondary | ICD-10-CM | POA: Diagnosis not present

## 2018-03-03 DIAGNOSIS — I5043 Acute on chronic combined systolic (congestive) and diastolic (congestive) heart failure: Secondary | ICD-10-CM | POA: Diagnosis not present

## 2018-03-03 DIAGNOSIS — I130 Hypertensive heart and chronic kidney disease with heart failure and stage 1 through stage 4 chronic kidney disease, or unspecified chronic kidney disease: Secondary | ICD-10-CM | POA: Diagnosis not present

## 2018-03-04 DIAGNOSIS — N189 Chronic kidney disease, unspecified: Secondary | ICD-10-CM | POA: Diagnosis not present

## 2018-03-04 DIAGNOSIS — D500 Iron deficiency anemia secondary to blood loss (chronic): Secondary | ICD-10-CM | POA: Diagnosis not present

## 2018-03-04 DIAGNOSIS — Z8546 Personal history of malignant neoplasm of prostate: Secondary | ICD-10-CM | POA: Diagnosis not present

## 2018-03-04 DIAGNOSIS — Z7982 Long term (current) use of aspirin: Secondary | ICD-10-CM | POA: Diagnosis not present

## 2018-03-04 DIAGNOSIS — I4891 Unspecified atrial fibrillation: Secondary | ICD-10-CM | POA: Diagnosis not present

## 2018-03-04 DIAGNOSIS — N183 Chronic kidney disease, stage 3 (moderate): Secondary | ICD-10-CM | POA: Diagnosis not present

## 2018-03-04 DIAGNOSIS — D631 Anemia in chronic kidney disease: Secondary | ICD-10-CM | POA: Diagnosis not present

## 2018-03-04 DIAGNOSIS — Z923 Personal history of irradiation: Secondary | ICD-10-CM | POA: Diagnosis not present

## 2018-03-07 DIAGNOSIS — I2510 Atherosclerotic heart disease of native coronary artery without angina pectoris: Secondary | ICD-10-CM | POA: Diagnosis not present

## 2018-03-07 DIAGNOSIS — I4891 Unspecified atrial fibrillation: Secondary | ICD-10-CM | POA: Diagnosis not present

## 2018-03-07 DIAGNOSIS — Z955 Presence of coronary angioplasty implant and graft: Secondary | ICD-10-CM | POA: Diagnosis not present

## 2018-03-07 DIAGNOSIS — I5032 Chronic diastolic (congestive) heart failure: Secondary | ICD-10-CM | POA: Diagnosis not present

## 2018-03-08 DIAGNOSIS — J9601 Acute respiratory failure with hypoxia: Secondary | ICD-10-CM | POA: Diagnosis not present

## 2018-03-08 DIAGNOSIS — I130 Hypertensive heart and chronic kidney disease with heart failure and stage 1 through stage 4 chronic kidney disease, or unspecified chronic kidney disease: Secondary | ICD-10-CM | POA: Diagnosis not present

## 2018-03-08 DIAGNOSIS — J441 Chronic obstructive pulmonary disease with (acute) exacerbation: Secondary | ICD-10-CM | POA: Diagnosis not present

## 2018-03-08 DIAGNOSIS — N183 Chronic kidney disease, stage 3 (moderate): Secondary | ICD-10-CM | POA: Diagnosis not present

## 2018-03-08 DIAGNOSIS — I5043 Acute on chronic combined systolic (congestive) and diastolic (congestive) heart failure: Secondary | ICD-10-CM | POA: Diagnosis not present

## 2018-03-08 DIAGNOSIS — J189 Pneumonia, unspecified organism: Secondary | ICD-10-CM | POA: Diagnosis not present

## 2018-03-08 DIAGNOSIS — J4540 Moderate persistent asthma, uncomplicated: Secondary | ICD-10-CM | POA: Diagnosis not present

## 2018-03-09 DIAGNOSIS — J4540 Moderate persistent asthma, uncomplicated: Secondary | ICD-10-CM | POA: Diagnosis not present

## 2018-03-09 DIAGNOSIS — J9601 Acute respiratory failure with hypoxia: Secondary | ICD-10-CM | POA: Diagnosis not present

## 2018-03-09 DIAGNOSIS — I5043 Acute on chronic combined systolic (congestive) and diastolic (congestive) heart failure: Secondary | ICD-10-CM | POA: Diagnosis not present

## 2018-03-09 DIAGNOSIS — J90 Pleural effusion, not elsewhere classified: Secondary | ICD-10-CM | POA: Diagnosis not present

## 2018-03-09 DIAGNOSIS — I130 Hypertensive heart and chronic kidney disease with heart failure and stage 1 through stage 4 chronic kidney disease, or unspecified chronic kidney disease: Secondary | ICD-10-CM | POA: Diagnosis not present

## 2018-03-09 DIAGNOSIS — N183 Chronic kidney disease, stage 3 (moderate): Secondary | ICD-10-CM | POA: Diagnosis not present

## 2018-03-09 DIAGNOSIS — J441 Chronic obstructive pulmonary disease with (acute) exacerbation: Secondary | ICD-10-CM | POA: Diagnosis not present

## 2018-03-09 DIAGNOSIS — J189 Pneumonia, unspecified organism: Secondary | ICD-10-CM | POA: Diagnosis not present

## 2018-03-10 DIAGNOSIS — N183 Chronic kidney disease, stage 3 (moderate): Secondary | ICD-10-CM | POA: Diagnosis not present

## 2018-03-10 DIAGNOSIS — S81811A Laceration without foreign body, right lower leg, initial encounter: Secondary | ICD-10-CM | POA: Diagnosis not present

## 2018-03-10 DIAGNOSIS — J9601 Acute respiratory failure with hypoxia: Secondary | ICD-10-CM | POA: Diagnosis not present

## 2018-03-10 DIAGNOSIS — J441 Chronic obstructive pulmonary disease with (acute) exacerbation: Secondary | ICD-10-CM | POA: Diagnosis not present

## 2018-03-10 DIAGNOSIS — J4540 Moderate persistent asthma, uncomplicated: Secondary | ICD-10-CM | POA: Diagnosis not present

## 2018-03-10 DIAGNOSIS — I5043 Acute on chronic combined systolic (congestive) and diastolic (congestive) heart failure: Secondary | ICD-10-CM | POA: Diagnosis not present

## 2018-03-10 DIAGNOSIS — J189 Pneumonia, unspecified organism: Secondary | ICD-10-CM | POA: Diagnosis not present

## 2018-03-10 DIAGNOSIS — I130 Hypertensive heart and chronic kidney disease with heart failure and stage 1 through stage 4 chronic kidney disease, or unspecified chronic kidney disease: Secondary | ICD-10-CM | POA: Diagnosis not present

## 2018-03-15 DIAGNOSIS — I130 Hypertensive heart and chronic kidney disease with heart failure and stage 1 through stage 4 chronic kidney disease, or unspecified chronic kidney disease: Secondary | ICD-10-CM | POA: Diagnosis not present

## 2018-03-15 DIAGNOSIS — N183 Chronic kidney disease, stage 3 (moderate): Secondary | ICD-10-CM | POA: Diagnosis not present

## 2018-03-15 DIAGNOSIS — J9601 Acute respiratory failure with hypoxia: Secondary | ICD-10-CM | POA: Diagnosis not present

## 2018-03-15 DIAGNOSIS — I5043 Acute on chronic combined systolic (congestive) and diastolic (congestive) heart failure: Secondary | ICD-10-CM | POA: Diagnosis not present

## 2018-03-15 DIAGNOSIS — J4540 Moderate persistent asthma, uncomplicated: Secondary | ICD-10-CM | POA: Diagnosis not present

## 2018-03-15 DIAGNOSIS — J441 Chronic obstructive pulmonary disease with (acute) exacerbation: Secondary | ICD-10-CM | POA: Diagnosis not present

## 2018-03-16 DIAGNOSIS — I2510 Atherosclerotic heart disease of native coronary artery without angina pectoris: Secondary | ICD-10-CM | POA: Diagnosis not present

## 2018-03-16 DIAGNOSIS — E6609 Other obesity due to excess calories: Secondary | ICD-10-CM | POA: Diagnosis not present

## 2018-03-16 DIAGNOSIS — I5032 Chronic diastolic (congestive) heart failure: Secondary | ICD-10-CM | POA: Diagnosis not present

## 2018-03-16 DIAGNOSIS — I519 Heart disease, unspecified: Secondary | ICD-10-CM | POA: Diagnosis not present

## 2018-03-16 DIAGNOSIS — I252 Old myocardial infarction: Secondary | ICD-10-CM | POA: Diagnosis not present

## 2018-03-16 DIAGNOSIS — E7800 Pure hypercholesterolemia, unspecified: Secondary | ICD-10-CM | POA: Diagnosis not present

## 2018-03-16 DIAGNOSIS — I2721 Secondary pulmonary arterial hypertension: Secondary | ICD-10-CM | POA: Diagnosis not present

## 2018-03-16 DIAGNOSIS — I6523 Occlusion and stenosis of bilateral carotid arteries: Secondary | ICD-10-CM | POA: Diagnosis not present

## 2018-03-16 DIAGNOSIS — I481 Persistent atrial fibrillation: Secondary | ICD-10-CM | POA: Diagnosis not present

## 2018-03-16 DIAGNOSIS — Z955 Presence of coronary angioplasty implant and graft: Secondary | ICD-10-CM | POA: Diagnosis not present

## 2018-03-17 DIAGNOSIS — I130 Hypertensive heart and chronic kidney disease with heart failure and stage 1 through stage 4 chronic kidney disease, or unspecified chronic kidney disease: Secondary | ICD-10-CM | POA: Diagnosis not present

## 2018-03-17 DIAGNOSIS — J9601 Acute respiratory failure with hypoxia: Secondary | ICD-10-CM | POA: Diagnosis not present

## 2018-03-17 DIAGNOSIS — J4540 Moderate persistent asthma, uncomplicated: Secondary | ICD-10-CM | POA: Diagnosis not present

## 2018-03-17 DIAGNOSIS — N183 Chronic kidney disease, stage 3 (moderate): Secondary | ICD-10-CM | POA: Diagnosis not present

## 2018-03-17 DIAGNOSIS — J441 Chronic obstructive pulmonary disease with (acute) exacerbation: Secondary | ICD-10-CM | POA: Diagnosis not present

## 2018-03-17 DIAGNOSIS — I5043 Acute on chronic combined systolic (congestive) and diastolic (congestive) heart failure: Secondary | ICD-10-CM | POA: Diagnosis not present

## 2018-03-22 ENCOUNTER — Other Ambulatory Visit: Payer: Self-pay | Admitting: Internal Medicine

## 2018-03-22 DIAGNOSIS — I130 Hypertensive heart and chronic kidney disease with heart failure and stage 1 through stage 4 chronic kidney disease, or unspecified chronic kidney disease: Secondary | ICD-10-CM | POA: Diagnosis not present

## 2018-03-22 DIAGNOSIS — J9601 Acute respiratory failure with hypoxia: Secondary | ICD-10-CM | POA: Diagnosis not present

## 2018-03-22 DIAGNOSIS — I5043 Acute on chronic combined systolic (congestive) and diastolic (congestive) heart failure: Secondary | ICD-10-CM | POA: Diagnosis not present

## 2018-03-22 DIAGNOSIS — J4540 Moderate persistent asthma, uncomplicated: Secondary | ICD-10-CM | POA: Diagnosis not present

## 2018-03-22 DIAGNOSIS — N183 Chronic kidney disease, stage 3 (moderate): Secondary | ICD-10-CM | POA: Diagnosis not present

## 2018-03-22 DIAGNOSIS — J441 Chronic obstructive pulmonary disease with (acute) exacerbation: Secondary | ICD-10-CM | POA: Diagnosis not present

## 2018-03-23 DIAGNOSIS — I5043 Acute on chronic combined systolic (congestive) and diastolic (congestive) heart failure: Secondary | ICD-10-CM | POA: Diagnosis not present

## 2018-03-23 DIAGNOSIS — I130 Hypertensive heart and chronic kidney disease with heart failure and stage 1 through stage 4 chronic kidney disease, or unspecified chronic kidney disease: Secondary | ICD-10-CM | POA: Diagnosis not present

## 2018-03-23 DIAGNOSIS — J441 Chronic obstructive pulmonary disease with (acute) exacerbation: Secondary | ICD-10-CM | POA: Diagnosis not present

## 2018-03-23 DIAGNOSIS — J9601 Acute respiratory failure with hypoxia: Secondary | ICD-10-CM | POA: Diagnosis not present

## 2018-03-23 DIAGNOSIS — N183 Chronic kidney disease, stage 3 (moderate): Secondary | ICD-10-CM | POA: Diagnosis not present

## 2018-03-23 DIAGNOSIS — J4540 Moderate persistent asthma, uncomplicated: Secondary | ICD-10-CM | POA: Diagnosis not present

## 2018-03-25 DIAGNOSIS — R0602 Shortness of breath: Secondary | ICD-10-CM | POA: Diagnosis not present

## 2018-03-25 DIAGNOSIS — Z6837 Body mass index (BMI) 37.0-37.9, adult: Secondary | ICD-10-CM | POA: Diagnosis not present

## 2018-03-25 DIAGNOSIS — I129 Hypertensive chronic kidney disease with stage 1 through stage 4 chronic kidney disease, or unspecified chronic kidney disease: Secondary | ICD-10-CM | POA: Diagnosis present

## 2018-03-25 DIAGNOSIS — K2960 Other gastritis without bleeding: Secondary | ICD-10-CM | POA: Diagnosis not present

## 2018-03-25 DIAGNOSIS — K922 Gastrointestinal hemorrhage, unspecified: Secondary | ICD-10-CM | POA: Diagnosis not present

## 2018-03-25 DIAGNOSIS — K222 Esophageal obstruction: Secondary | ICD-10-CM | POA: Diagnosis present

## 2018-03-25 DIAGNOSIS — D62 Acute posthemorrhagic anemia: Secondary | ICD-10-CM | POA: Diagnosis not present

## 2018-03-25 DIAGNOSIS — Z923 Personal history of irradiation: Secondary | ICD-10-CM | POA: Diagnosis not present

## 2018-03-25 DIAGNOSIS — J449 Chronic obstructive pulmonary disease, unspecified: Secondary | ICD-10-CM | POA: Diagnosis present

## 2018-03-25 DIAGNOSIS — Z87891 Personal history of nicotine dependence: Secondary | ICD-10-CM | POA: Diagnosis not present

## 2018-03-25 DIAGNOSIS — K31811 Angiodysplasia of stomach and duodenum with bleeding: Secondary | ICD-10-CM | POA: Diagnosis not present

## 2018-03-25 DIAGNOSIS — Z96642 Presence of left artificial hip joint: Secondary | ICD-10-CM | POA: Diagnosis present

## 2018-03-25 DIAGNOSIS — Z8546 Personal history of malignant neoplasm of prostate: Secondary | ICD-10-CM | POA: Diagnosis not present

## 2018-03-25 DIAGNOSIS — Z7901 Long term (current) use of anticoagulants: Secondary | ICD-10-CM | POA: Diagnosis not present

## 2018-03-25 DIAGNOSIS — I252 Old myocardial infarction: Secondary | ICD-10-CM | POA: Diagnosis not present

## 2018-03-25 DIAGNOSIS — E039 Hypothyroidism, unspecified: Secondary | ICD-10-CM | POA: Diagnosis present

## 2018-03-25 DIAGNOSIS — I4891 Unspecified atrial fibrillation: Secondary | ICD-10-CM | POA: Diagnosis not present

## 2018-03-25 DIAGNOSIS — Z79899 Other long term (current) drug therapy: Secondary | ICD-10-CM | POA: Diagnosis not present

## 2018-03-25 DIAGNOSIS — Z87442 Personal history of urinary calculi: Secondary | ICD-10-CM | POA: Diagnosis not present

## 2018-03-25 DIAGNOSIS — K3189 Other diseases of stomach and duodenum: Secondary | ICD-10-CM | POA: Diagnosis not present

## 2018-03-25 DIAGNOSIS — Z9981 Dependence on supplemental oxygen: Secondary | ICD-10-CM | POA: Diagnosis not present

## 2018-03-25 DIAGNOSIS — E669 Obesity, unspecified: Secondary | ICD-10-CM | POA: Diagnosis present

## 2018-03-25 DIAGNOSIS — K5710 Diverticulosis of small intestine without perforation or abscess without bleeding: Secondary | ICD-10-CM | POA: Diagnosis not present

## 2018-03-25 DIAGNOSIS — N183 Chronic kidney disease, stage 3 (moderate): Secondary | ICD-10-CM | POA: Diagnosis not present

## 2018-03-25 DIAGNOSIS — Z7982 Long term (current) use of aspirin: Secondary | ICD-10-CM | POA: Diagnosis not present

## 2018-03-25 DIAGNOSIS — R531 Weakness: Secondary | ICD-10-CM | POA: Diagnosis not present

## 2018-03-25 DIAGNOSIS — D500 Iron deficiency anemia secondary to blood loss (chronic): Secondary | ICD-10-CM | POA: Diagnosis not present

## 2018-03-25 DIAGNOSIS — C61 Malignant neoplasm of prostate: Secondary | ICD-10-CM | POA: Diagnosis present

## 2018-03-25 DIAGNOSIS — I2510 Atherosclerotic heart disease of native coronary artery without angina pectoris: Secondary | ICD-10-CM | POA: Diagnosis not present

## 2018-03-25 DIAGNOSIS — K921 Melena: Secondary | ICD-10-CM | POA: Diagnosis not present

## 2018-03-28 DIAGNOSIS — I130 Hypertensive heart and chronic kidney disease with heart failure and stage 1 through stage 4 chronic kidney disease, or unspecified chronic kidney disease: Secondary | ICD-10-CM | POA: Diagnosis not present

## 2018-03-28 DIAGNOSIS — I5043 Acute on chronic combined systolic (congestive) and diastolic (congestive) heart failure: Secondary | ICD-10-CM | POA: Diagnosis not present

## 2018-03-28 DIAGNOSIS — J9601 Acute respiratory failure with hypoxia: Secondary | ICD-10-CM | POA: Diagnosis not present

## 2018-03-28 DIAGNOSIS — J4540 Moderate persistent asthma, uncomplicated: Secondary | ICD-10-CM | POA: Diagnosis not present

## 2018-03-28 DIAGNOSIS — J441 Chronic obstructive pulmonary disease with (acute) exacerbation: Secondary | ICD-10-CM | POA: Diagnosis not present

## 2018-03-28 DIAGNOSIS — N183 Chronic kidney disease, stage 3 (moderate): Secondary | ICD-10-CM | POA: Diagnosis not present

## 2018-03-29 DIAGNOSIS — N183 Chronic kidney disease, stage 3 (moderate): Secondary | ICD-10-CM | POA: Diagnosis not present

## 2018-03-29 DIAGNOSIS — J9601 Acute respiratory failure with hypoxia: Secondary | ICD-10-CM | POA: Diagnosis not present

## 2018-03-29 DIAGNOSIS — I130 Hypertensive heart and chronic kidney disease with heart failure and stage 1 through stage 4 chronic kidney disease, or unspecified chronic kidney disease: Secondary | ICD-10-CM | POA: Diagnosis not present

## 2018-03-29 DIAGNOSIS — J4540 Moderate persistent asthma, uncomplicated: Secondary | ICD-10-CM | POA: Diagnosis not present

## 2018-03-29 DIAGNOSIS — I5043 Acute on chronic combined systolic (congestive) and diastolic (congestive) heart failure: Secondary | ICD-10-CM | POA: Diagnosis not present

## 2018-03-29 DIAGNOSIS — J441 Chronic obstructive pulmonary disease with (acute) exacerbation: Secondary | ICD-10-CM | POA: Diagnosis not present

## 2018-03-30 DIAGNOSIS — D500 Iron deficiency anemia secondary to blood loss (chronic): Secondary | ICD-10-CM | POA: Diagnosis not present

## 2018-03-31 DIAGNOSIS — I5043 Acute on chronic combined systolic (congestive) and diastolic (congestive) heart failure: Secondary | ICD-10-CM | POA: Diagnosis not present

## 2018-03-31 DIAGNOSIS — N183 Chronic kidney disease, stage 3 (moderate): Secondary | ICD-10-CM | POA: Diagnosis not present

## 2018-03-31 DIAGNOSIS — J441 Chronic obstructive pulmonary disease with (acute) exacerbation: Secondary | ICD-10-CM | POA: Diagnosis not present

## 2018-03-31 DIAGNOSIS — J9601 Acute respiratory failure with hypoxia: Secondary | ICD-10-CM | POA: Diagnosis not present

## 2018-03-31 DIAGNOSIS — R600 Localized edema: Secondary | ICD-10-CM | POA: Diagnosis not present

## 2018-03-31 DIAGNOSIS — J984 Other disorders of lung: Secondary | ICD-10-CM | POA: Diagnosis not present

## 2018-03-31 DIAGNOSIS — I130 Hypertensive heart and chronic kidney disease with heart failure and stage 1 through stage 4 chronic kidney disease, or unspecified chronic kidney disease: Secondary | ICD-10-CM | POA: Diagnosis not present

## 2018-03-31 DIAGNOSIS — J4540 Moderate persistent asthma, uncomplicated: Secondary | ICD-10-CM | POA: Diagnosis not present

## 2018-03-31 DIAGNOSIS — R0609 Other forms of dyspnea: Secondary | ICD-10-CM | POA: Diagnosis not present

## 2018-04-01 DIAGNOSIS — N183 Chronic kidney disease, stage 3 (moderate): Secondary | ICD-10-CM | POA: Diagnosis not present

## 2018-04-01 DIAGNOSIS — J9601 Acute respiratory failure with hypoxia: Secondary | ICD-10-CM | POA: Diagnosis not present

## 2018-04-01 DIAGNOSIS — J4540 Moderate persistent asthma, uncomplicated: Secondary | ICD-10-CM | POA: Diagnosis not present

## 2018-04-01 DIAGNOSIS — J441 Chronic obstructive pulmonary disease with (acute) exacerbation: Secondary | ICD-10-CM | POA: Diagnosis not present

## 2018-04-01 DIAGNOSIS — I5043 Acute on chronic combined systolic (congestive) and diastolic (congestive) heart failure: Secondary | ICD-10-CM | POA: Diagnosis not present

## 2018-04-01 DIAGNOSIS — I130 Hypertensive heart and chronic kidney disease with heart failure and stage 1 through stage 4 chronic kidney disease, or unspecified chronic kidney disease: Secondary | ICD-10-CM | POA: Diagnosis not present

## 2018-04-04 DIAGNOSIS — K922 Gastrointestinal hemorrhage, unspecified: Secondary | ICD-10-CM | POA: Diagnosis not present

## 2018-04-04 DIAGNOSIS — J449 Chronic obstructive pulmonary disease, unspecified: Secondary | ICD-10-CM | POA: Diagnosis not present

## 2018-04-04 DIAGNOSIS — D649 Anemia, unspecified: Secondary | ICD-10-CM | POA: Diagnosis not present

## 2018-04-05 DIAGNOSIS — I130 Hypertensive heart and chronic kidney disease with heart failure and stage 1 through stage 4 chronic kidney disease, or unspecified chronic kidney disease: Secondary | ICD-10-CM | POA: Diagnosis not present

## 2018-04-05 DIAGNOSIS — I5043 Acute on chronic combined systolic (congestive) and diastolic (congestive) heart failure: Secondary | ICD-10-CM | POA: Diagnosis not present

## 2018-04-05 DIAGNOSIS — J4540 Moderate persistent asthma, uncomplicated: Secondary | ICD-10-CM | POA: Diagnosis not present

## 2018-04-05 DIAGNOSIS — J441 Chronic obstructive pulmonary disease with (acute) exacerbation: Secondary | ICD-10-CM | POA: Diagnosis not present

## 2018-04-05 DIAGNOSIS — N183 Chronic kidney disease, stage 3 (moderate): Secondary | ICD-10-CM | POA: Diagnosis not present

## 2018-04-05 DIAGNOSIS — J9601 Acute respiratory failure with hypoxia: Secondary | ICD-10-CM | POA: Diagnosis not present

## 2018-04-06 DIAGNOSIS — Z923 Personal history of irradiation: Secondary | ICD-10-CM | POA: Diagnosis not present

## 2018-04-06 DIAGNOSIS — N183 Chronic kidney disease, stage 3 (moderate): Secondary | ICD-10-CM | POA: Diagnosis not present

## 2018-04-06 DIAGNOSIS — R600 Localized edema: Secondary | ICD-10-CM | POA: Diagnosis not present

## 2018-04-06 DIAGNOSIS — D631 Anemia in chronic kidney disease: Secondary | ICD-10-CM | POA: Diagnosis not present

## 2018-04-06 DIAGNOSIS — K922 Gastrointestinal hemorrhage, unspecified: Secondary | ICD-10-CM | POA: Diagnosis not present

## 2018-04-06 DIAGNOSIS — R0602 Shortness of breath: Secondary | ICD-10-CM | POA: Diagnosis not present

## 2018-04-06 DIAGNOSIS — Z8546 Personal history of malignant neoplasm of prostate: Secondary | ICD-10-CM | POA: Diagnosis not present

## 2018-04-06 DIAGNOSIS — Z7901 Long term (current) use of anticoagulants: Secondary | ICD-10-CM | POA: Diagnosis not present

## 2018-04-06 DIAGNOSIS — R0609 Other forms of dyspnea: Secondary | ICD-10-CM | POA: Diagnosis not present

## 2018-04-07 DIAGNOSIS — D509 Iron deficiency anemia, unspecified: Secondary | ICD-10-CM | POA: Diagnosis not present

## 2018-04-07 DIAGNOSIS — Z9981 Dependence on supplemental oxygen: Secondary | ICD-10-CM | POA: Diagnosis not present

## 2018-04-07 DIAGNOSIS — I2510 Atherosclerotic heart disease of native coronary artery without angina pectoris: Secondary | ICD-10-CM | POA: Diagnosis not present

## 2018-04-07 DIAGNOSIS — J4540 Moderate persistent asthma, uncomplicated: Secondary | ICD-10-CM | POA: Diagnosis not present

## 2018-04-07 DIAGNOSIS — Z7952 Long term (current) use of systemic steroids: Secondary | ICD-10-CM | POA: Diagnosis not present

## 2018-04-07 DIAGNOSIS — J441 Chronic obstructive pulmonary disease with (acute) exacerbation: Secondary | ICD-10-CM | POA: Diagnosis not present

## 2018-04-07 DIAGNOSIS — E039 Hypothyroidism, unspecified: Secondary | ICD-10-CM | POA: Diagnosis not present

## 2018-04-07 DIAGNOSIS — Z87891 Personal history of nicotine dependence: Secondary | ICD-10-CM | POA: Diagnosis not present

## 2018-04-07 DIAGNOSIS — E669 Obesity, unspecified: Secondary | ICD-10-CM | POA: Diagnosis not present

## 2018-04-07 DIAGNOSIS — S81801A Unspecified open wound, right lower leg, initial encounter: Secondary | ICD-10-CM | POA: Diagnosis not present

## 2018-04-07 DIAGNOSIS — Z8546 Personal history of malignant neoplasm of prostate: Secondary | ICD-10-CM | POA: Diagnosis not present

## 2018-04-07 DIAGNOSIS — Z9181 History of falling: Secondary | ICD-10-CM | POA: Diagnosis not present

## 2018-04-07 DIAGNOSIS — N183 Chronic kidney disease, stage 3 (moderate): Secondary | ICD-10-CM | POA: Diagnosis not present

## 2018-04-07 DIAGNOSIS — I5043 Acute on chronic combined systolic (congestive) and diastolic (congestive) heart failure: Secondary | ICD-10-CM | POA: Diagnosis not present

## 2018-04-07 DIAGNOSIS — I130 Hypertensive heart and chronic kidney disease with heart failure and stage 1 through stage 4 chronic kidney disease, or unspecified chronic kidney disease: Secondary | ICD-10-CM | POA: Diagnosis not present

## 2018-04-07 DIAGNOSIS — J9601 Acute respiratory failure with hypoxia: Secondary | ICD-10-CM | POA: Diagnosis not present

## 2018-04-08 DIAGNOSIS — R05 Cough: Secondary | ICD-10-CM | POA: Diagnosis not present

## 2018-04-08 DIAGNOSIS — J9601 Acute respiratory failure with hypoxia: Secondary | ICD-10-CM | POA: Diagnosis not present

## 2018-04-08 DIAGNOSIS — J984 Other disorders of lung: Secondary | ICD-10-CM | POA: Diagnosis not present

## 2018-04-08 DIAGNOSIS — I5043 Acute on chronic combined systolic (congestive) and diastolic (congestive) heart failure: Secondary | ICD-10-CM | POA: Diagnosis not present

## 2018-04-08 DIAGNOSIS — J4540 Moderate persistent asthma, uncomplicated: Secondary | ICD-10-CM | POA: Diagnosis not present

## 2018-04-08 DIAGNOSIS — S81801A Unspecified open wound, right lower leg, initial encounter: Secondary | ICD-10-CM | POA: Diagnosis not present

## 2018-04-08 DIAGNOSIS — J90 Pleural effusion, not elsewhere classified: Secondary | ICD-10-CM | POA: Diagnosis not present

## 2018-04-08 DIAGNOSIS — I130 Hypertensive heart and chronic kidney disease with heart failure and stage 1 through stage 4 chronic kidney disease, or unspecified chronic kidney disease: Secondary | ICD-10-CM | POA: Diagnosis not present

## 2018-04-08 DIAGNOSIS — J441 Chronic obstructive pulmonary disease with (acute) exacerbation: Secondary | ICD-10-CM | POA: Diagnosis not present

## 2018-04-08 DIAGNOSIS — J189 Pneumonia, unspecified organism: Secondary | ICD-10-CM | POA: Diagnosis not present

## 2018-04-11 DIAGNOSIS — I130 Hypertensive heart and chronic kidney disease with heart failure and stage 1 through stage 4 chronic kidney disease, or unspecified chronic kidney disease: Secondary | ICD-10-CM | POA: Diagnosis not present

## 2018-04-11 DIAGNOSIS — S81801A Unspecified open wound, right lower leg, initial encounter: Secondary | ICD-10-CM | POA: Diagnosis not present

## 2018-04-11 DIAGNOSIS — J9601 Acute respiratory failure with hypoxia: Secondary | ICD-10-CM | POA: Diagnosis not present

## 2018-04-11 DIAGNOSIS — J441 Chronic obstructive pulmonary disease with (acute) exacerbation: Secondary | ICD-10-CM | POA: Diagnosis not present

## 2018-04-11 DIAGNOSIS — I5043 Acute on chronic combined systolic (congestive) and diastolic (congestive) heart failure: Secondary | ICD-10-CM | POA: Diagnosis not present

## 2018-04-11 DIAGNOSIS — J4540 Moderate persistent asthma, uncomplicated: Secondary | ICD-10-CM | POA: Diagnosis not present

## 2018-04-14 DIAGNOSIS — I130 Hypertensive heart and chronic kidney disease with heart failure and stage 1 through stage 4 chronic kidney disease, or unspecified chronic kidney disease: Secondary | ICD-10-CM | POA: Diagnosis not present

## 2018-04-14 DIAGNOSIS — R0609 Other forms of dyspnea: Secondary | ICD-10-CM | POA: Diagnosis not present

## 2018-04-14 DIAGNOSIS — Z9189 Other specified personal risk factors, not elsewhere classified: Secondary | ICD-10-CM | POA: Diagnosis not present

## 2018-04-14 DIAGNOSIS — J4540 Moderate persistent asthma, uncomplicated: Secondary | ICD-10-CM | POA: Diagnosis not present

## 2018-04-14 DIAGNOSIS — J9601 Acute respiratory failure with hypoxia: Secondary | ICD-10-CM | POA: Diagnosis not present

## 2018-04-14 DIAGNOSIS — J984 Other disorders of lung: Secondary | ICD-10-CM | POA: Diagnosis not present

## 2018-04-14 DIAGNOSIS — I5043 Acute on chronic combined systolic (congestive) and diastolic (congestive) heart failure: Secondary | ICD-10-CM | POA: Diagnosis not present

## 2018-04-14 DIAGNOSIS — R0902 Hypoxemia: Secondary | ICD-10-CM | POA: Diagnosis not present

## 2018-04-14 DIAGNOSIS — S81801A Unspecified open wound, right lower leg, initial encounter: Secondary | ICD-10-CM | POA: Diagnosis not present

## 2018-04-14 DIAGNOSIS — J441 Chronic obstructive pulmonary disease with (acute) exacerbation: Secondary | ICD-10-CM | POA: Diagnosis not present

## 2018-04-18 DIAGNOSIS — J4540 Moderate persistent asthma, uncomplicated: Secondary | ICD-10-CM | POA: Diagnosis not present

## 2018-04-18 DIAGNOSIS — S81801A Unspecified open wound, right lower leg, initial encounter: Secondary | ICD-10-CM | POA: Diagnosis not present

## 2018-04-18 DIAGNOSIS — J9601 Acute respiratory failure with hypoxia: Secondary | ICD-10-CM | POA: Diagnosis not present

## 2018-04-18 DIAGNOSIS — J441 Chronic obstructive pulmonary disease with (acute) exacerbation: Secondary | ICD-10-CM | POA: Diagnosis not present

## 2018-04-18 DIAGNOSIS — I5043 Acute on chronic combined systolic (congestive) and diastolic (congestive) heart failure: Secondary | ICD-10-CM | POA: Diagnosis not present

## 2018-04-18 DIAGNOSIS — I130 Hypertensive heart and chronic kidney disease with heart failure and stage 1 through stage 4 chronic kidney disease, or unspecified chronic kidney disease: Secondary | ICD-10-CM | POA: Diagnosis not present

## 2018-04-19 DIAGNOSIS — J4540 Moderate persistent asthma, uncomplicated: Secondary | ICD-10-CM | POA: Diagnosis not present

## 2018-04-19 DIAGNOSIS — J441 Chronic obstructive pulmonary disease with (acute) exacerbation: Secondary | ICD-10-CM | POA: Diagnosis not present

## 2018-04-19 DIAGNOSIS — S81801A Unspecified open wound, right lower leg, initial encounter: Secondary | ICD-10-CM | POA: Diagnosis not present

## 2018-04-19 DIAGNOSIS — I5043 Acute on chronic combined systolic (congestive) and diastolic (congestive) heart failure: Secondary | ICD-10-CM | POA: Diagnosis not present

## 2018-04-19 DIAGNOSIS — I130 Hypertensive heart and chronic kidney disease with heart failure and stage 1 through stage 4 chronic kidney disease, or unspecified chronic kidney disease: Secondary | ICD-10-CM | POA: Diagnosis not present

## 2018-04-19 DIAGNOSIS — J9601 Acute respiratory failure with hypoxia: Secondary | ICD-10-CM | POA: Diagnosis not present

## 2018-04-21 DIAGNOSIS — J9601 Acute respiratory failure with hypoxia: Secondary | ICD-10-CM | POA: Diagnosis not present

## 2018-04-21 DIAGNOSIS — I130 Hypertensive heart and chronic kidney disease with heart failure and stage 1 through stage 4 chronic kidney disease, or unspecified chronic kidney disease: Secondary | ICD-10-CM | POA: Diagnosis not present

## 2018-04-21 DIAGNOSIS — I5043 Acute on chronic combined systolic (congestive) and diastolic (congestive) heart failure: Secondary | ICD-10-CM | POA: Diagnosis not present

## 2018-04-21 DIAGNOSIS — J4540 Moderate persistent asthma, uncomplicated: Secondary | ICD-10-CM | POA: Diagnosis not present

## 2018-04-21 DIAGNOSIS — S81801A Unspecified open wound, right lower leg, initial encounter: Secondary | ICD-10-CM | POA: Diagnosis not present

## 2018-04-21 DIAGNOSIS — J441 Chronic obstructive pulmonary disease with (acute) exacerbation: Secondary | ICD-10-CM | POA: Diagnosis not present

## 2018-04-26 DIAGNOSIS — I5043 Acute on chronic combined systolic (congestive) and diastolic (congestive) heart failure: Secondary | ICD-10-CM | POA: Diagnosis not present

## 2018-04-26 DIAGNOSIS — H4912 Fourth [trochlear] nerve palsy, left eye: Secondary | ICD-10-CM | POA: Diagnosis not present

## 2018-04-26 DIAGNOSIS — S81801A Unspecified open wound, right lower leg, initial encounter: Secondary | ICD-10-CM | POA: Diagnosis not present

## 2018-04-26 DIAGNOSIS — J4540 Moderate persistent asthma, uncomplicated: Secondary | ICD-10-CM | POA: Diagnosis not present

## 2018-04-26 DIAGNOSIS — H35363 Drusen (degenerative) of macula, bilateral: Secondary | ICD-10-CM | POA: Diagnosis not present

## 2018-04-26 DIAGNOSIS — J441 Chronic obstructive pulmonary disease with (acute) exacerbation: Secondary | ICD-10-CM | POA: Diagnosis not present

## 2018-04-26 DIAGNOSIS — I130 Hypertensive heart and chronic kidney disease with heart failure and stage 1 through stage 4 chronic kidney disease, or unspecified chronic kidney disease: Secondary | ICD-10-CM | POA: Diagnosis not present

## 2018-04-26 DIAGNOSIS — J9601 Acute respiratory failure with hypoxia: Secondary | ICD-10-CM | POA: Diagnosis not present

## 2018-04-28 DIAGNOSIS — Z8546 Personal history of malignant neoplasm of prostate: Secondary | ICD-10-CM | POA: Diagnosis not present

## 2018-04-28 DIAGNOSIS — Z923 Personal history of irradiation: Secondary | ICD-10-CM | POA: Diagnosis not present

## 2018-04-28 DIAGNOSIS — J441 Chronic obstructive pulmonary disease with (acute) exacerbation: Secondary | ICD-10-CM | POA: Diagnosis not present

## 2018-04-28 DIAGNOSIS — I5043 Acute on chronic combined systolic (congestive) and diastolic (congestive) heart failure: Secondary | ICD-10-CM | POA: Diagnosis not present

## 2018-04-28 DIAGNOSIS — J9601 Acute respiratory failure with hypoxia: Secondary | ICD-10-CM | POA: Diagnosis not present

## 2018-04-28 DIAGNOSIS — I130 Hypertensive heart and chronic kidney disease with heart failure and stage 1 through stage 4 chronic kidney disease, or unspecified chronic kidney disease: Secondary | ICD-10-CM | POA: Diagnosis not present

## 2018-04-28 DIAGNOSIS — J4540 Moderate persistent asthma, uncomplicated: Secondary | ICD-10-CM | POA: Diagnosis not present

## 2018-04-28 DIAGNOSIS — S81801A Unspecified open wound, right lower leg, initial encounter: Secondary | ICD-10-CM | POA: Diagnosis not present

## 2018-04-28 DIAGNOSIS — I129 Hypertensive chronic kidney disease with stage 1 through stage 4 chronic kidney disease, or unspecified chronic kidney disease: Secondary | ICD-10-CM | POA: Diagnosis not present

## 2018-04-28 DIAGNOSIS — R799 Abnormal finding of blood chemistry, unspecified: Secondary | ICD-10-CM | POA: Diagnosis not present

## 2018-04-28 DIAGNOSIS — N183 Chronic kidney disease, stage 3 (moderate): Secondary | ICD-10-CM | POA: Diagnosis not present

## 2018-04-28 DIAGNOSIS — D631 Anemia in chronic kidney disease: Secondary | ICD-10-CM | POA: Diagnosis not present

## 2018-05-03 DIAGNOSIS — I130 Hypertensive heart and chronic kidney disease with heart failure and stage 1 through stage 4 chronic kidney disease, or unspecified chronic kidney disease: Secondary | ICD-10-CM | POA: Diagnosis not present

## 2018-05-03 DIAGNOSIS — J4540 Moderate persistent asthma, uncomplicated: Secondary | ICD-10-CM | POA: Diagnosis not present

## 2018-05-03 DIAGNOSIS — S81801A Unspecified open wound, right lower leg, initial encounter: Secondary | ICD-10-CM | POA: Diagnosis not present

## 2018-05-03 DIAGNOSIS — I5043 Acute on chronic combined systolic (congestive) and diastolic (congestive) heart failure: Secondary | ICD-10-CM | POA: Diagnosis not present

## 2018-05-03 DIAGNOSIS — J9601 Acute respiratory failure with hypoxia: Secondary | ICD-10-CM | POA: Diagnosis not present

## 2018-05-03 DIAGNOSIS — J441 Chronic obstructive pulmonary disease with (acute) exacerbation: Secondary | ICD-10-CM | POA: Diagnosis not present

## 2018-05-05 DIAGNOSIS — R0609 Other forms of dyspnea: Secondary | ICD-10-CM | POA: Diagnosis not present

## 2018-05-05 DIAGNOSIS — I5043 Acute on chronic combined systolic (congestive) and diastolic (congestive) heart failure: Secondary | ICD-10-CM | POA: Diagnosis not present

## 2018-05-05 DIAGNOSIS — J4540 Moderate persistent asthma, uncomplicated: Secondary | ICD-10-CM | POA: Diagnosis not present

## 2018-05-05 DIAGNOSIS — J441 Chronic obstructive pulmonary disease with (acute) exacerbation: Secondary | ICD-10-CM | POA: Diagnosis not present

## 2018-05-05 DIAGNOSIS — I130 Hypertensive heart and chronic kidney disease with heart failure and stage 1 through stage 4 chronic kidney disease, or unspecified chronic kidney disease: Secondary | ICD-10-CM | POA: Diagnosis not present

## 2018-05-05 DIAGNOSIS — N183 Chronic kidney disease, stage 3 (moderate): Secondary | ICD-10-CM | POA: Diagnosis not present

## 2018-05-05 DIAGNOSIS — S81801A Unspecified open wound, right lower leg, initial encounter: Secondary | ICD-10-CM | POA: Diagnosis not present

## 2018-05-05 DIAGNOSIS — J984 Other disorders of lung: Secondary | ICD-10-CM | POA: Diagnosis not present

## 2018-05-05 DIAGNOSIS — R799 Abnormal finding of blood chemistry, unspecified: Secondary | ICD-10-CM | POA: Diagnosis not present

## 2018-05-05 DIAGNOSIS — J9601 Acute respiratory failure with hypoxia: Secondary | ICD-10-CM | POA: Diagnosis not present

## 2018-05-10 DIAGNOSIS — I5043 Acute on chronic combined systolic (congestive) and diastolic (congestive) heart failure: Secondary | ICD-10-CM | POA: Diagnosis not present

## 2018-05-10 DIAGNOSIS — J4540 Moderate persistent asthma, uncomplicated: Secondary | ICD-10-CM | POA: Diagnosis not present

## 2018-05-10 DIAGNOSIS — J9601 Acute respiratory failure with hypoxia: Secondary | ICD-10-CM | POA: Diagnosis not present

## 2018-05-10 DIAGNOSIS — I130 Hypertensive heart and chronic kidney disease with heart failure and stage 1 through stage 4 chronic kidney disease, or unspecified chronic kidney disease: Secondary | ICD-10-CM | POA: Diagnosis not present

## 2018-05-10 DIAGNOSIS — S81801A Unspecified open wound, right lower leg, initial encounter: Secondary | ICD-10-CM | POA: Diagnosis not present

## 2018-05-10 DIAGNOSIS — J441 Chronic obstructive pulmonary disease with (acute) exacerbation: Secondary | ICD-10-CM | POA: Diagnosis not present

## 2018-05-11 DIAGNOSIS — Z5309 Procedure and treatment not carried out because of other contraindication: Secondary | ICD-10-CM | POA: Diagnosis not present

## 2018-05-11 DIAGNOSIS — J431 Panlobular emphysema: Secondary | ICD-10-CM | POA: Diagnosis not present

## 2018-05-11 DIAGNOSIS — I2721 Secondary pulmonary arterial hypertension: Secondary | ICD-10-CM | POA: Diagnosis not present

## 2018-05-11 DIAGNOSIS — I519 Heart disease, unspecified: Secondary | ICD-10-CM | POA: Diagnosis not present

## 2018-05-11 DIAGNOSIS — I2510 Atherosclerotic heart disease of native coronary artery without angina pectoris: Secondary | ICD-10-CM | POA: Diagnosis not present

## 2018-05-11 DIAGNOSIS — D62 Acute posthemorrhagic anemia: Secondary | ICD-10-CM | POA: Diagnosis not present

## 2018-05-11 DIAGNOSIS — J984 Other disorders of lung: Secondary | ICD-10-CM | POA: Diagnosis not present

## 2018-05-11 DIAGNOSIS — N183 Chronic kidney disease, stage 3 (moderate): Secondary | ICD-10-CM | POA: Diagnosis not present

## 2018-05-11 DIAGNOSIS — I481 Persistent atrial fibrillation: Secondary | ICD-10-CM | POA: Diagnosis not present

## 2018-05-11 DIAGNOSIS — Z9981 Dependence on supplemental oxygen: Secondary | ICD-10-CM | POA: Diagnosis not present

## 2018-05-11 DIAGNOSIS — I5032 Chronic diastolic (congestive) heart failure: Secondary | ICD-10-CM | POA: Diagnosis not present

## 2018-05-11 DIAGNOSIS — K922 Gastrointestinal hemorrhage, unspecified: Secondary | ICD-10-CM | POA: Diagnosis not present

## 2018-05-12 DIAGNOSIS — I129 Hypertensive chronic kidney disease with stage 1 through stage 4 chronic kidney disease, or unspecified chronic kidney disease: Secondary | ICD-10-CM | POA: Diagnosis not present

## 2018-05-12 DIAGNOSIS — Z923 Personal history of irradiation: Secondary | ICD-10-CM | POA: Diagnosis not present

## 2018-05-12 DIAGNOSIS — Z8546 Personal history of malignant neoplasm of prostate: Secondary | ICD-10-CM | POA: Diagnosis not present

## 2018-05-12 DIAGNOSIS — N183 Chronic kidney disease, stage 3 (moderate): Secondary | ICD-10-CM | POA: Diagnosis not present

## 2018-05-12 DIAGNOSIS — R799 Abnormal finding of blood chemistry, unspecified: Secondary | ICD-10-CM | POA: Diagnosis not present

## 2018-05-12 DIAGNOSIS — D631 Anemia in chronic kidney disease: Secondary | ICD-10-CM | POA: Diagnosis not present

## 2018-05-16 DIAGNOSIS — J4540 Moderate persistent asthma, uncomplicated: Secondary | ICD-10-CM | POA: Diagnosis not present

## 2018-05-16 DIAGNOSIS — I5043 Acute on chronic combined systolic (congestive) and diastolic (congestive) heart failure: Secondary | ICD-10-CM | POA: Diagnosis not present

## 2018-05-16 DIAGNOSIS — J9601 Acute respiratory failure with hypoxia: Secondary | ICD-10-CM | POA: Diagnosis not present

## 2018-05-16 DIAGNOSIS — R32 Unspecified urinary incontinence: Secondary | ICD-10-CM | POA: Diagnosis not present

## 2018-05-16 DIAGNOSIS — J441 Chronic obstructive pulmonary disease with (acute) exacerbation: Secondary | ICD-10-CM | POA: Diagnosis not present

## 2018-05-16 DIAGNOSIS — S81801A Unspecified open wound, right lower leg, initial encounter: Secondary | ICD-10-CM | POA: Diagnosis not present

## 2018-05-16 DIAGNOSIS — I130 Hypertensive heart and chronic kidney disease with heart failure and stage 1 through stage 4 chronic kidney disease, or unspecified chronic kidney disease: Secondary | ICD-10-CM | POA: Diagnosis not present

## 2018-05-17 DIAGNOSIS — J4540 Moderate persistent asthma, uncomplicated: Secondary | ICD-10-CM | POA: Diagnosis not present

## 2018-05-17 DIAGNOSIS — I5043 Acute on chronic combined systolic (congestive) and diastolic (congestive) heart failure: Secondary | ICD-10-CM | POA: Diagnosis not present

## 2018-05-17 DIAGNOSIS — J9601 Acute respiratory failure with hypoxia: Secondary | ICD-10-CM | POA: Diagnosis not present

## 2018-05-17 DIAGNOSIS — I130 Hypertensive heart and chronic kidney disease with heart failure and stage 1 through stage 4 chronic kidney disease, or unspecified chronic kidney disease: Secondary | ICD-10-CM | POA: Diagnosis not present

## 2018-05-17 DIAGNOSIS — S81801A Unspecified open wound, right lower leg, initial encounter: Secondary | ICD-10-CM | POA: Diagnosis not present

## 2018-05-17 DIAGNOSIS — J441 Chronic obstructive pulmonary disease with (acute) exacerbation: Secondary | ICD-10-CM | POA: Diagnosis not present

## 2018-05-19 DIAGNOSIS — J4540 Moderate persistent asthma, uncomplicated: Secondary | ICD-10-CM | POA: Diagnosis not present

## 2018-05-19 DIAGNOSIS — I5043 Acute on chronic combined systolic (congestive) and diastolic (congestive) heart failure: Secondary | ICD-10-CM | POA: Diagnosis not present

## 2018-05-19 DIAGNOSIS — I130 Hypertensive heart and chronic kidney disease with heart failure and stage 1 through stage 4 chronic kidney disease, or unspecified chronic kidney disease: Secondary | ICD-10-CM | POA: Diagnosis not present

## 2018-05-19 DIAGNOSIS — S81801A Unspecified open wound, right lower leg, initial encounter: Secondary | ICD-10-CM | POA: Diagnosis not present

## 2018-05-19 DIAGNOSIS — J9601 Acute respiratory failure with hypoxia: Secondary | ICD-10-CM | POA: Diagnosis not present

## 2018-05-19 DIAGNOSIS — J441 Chronic obstructive pulmonary disease with (acute) exacerbation: Secondary | ICD-10-CM | POA: Diagnosis not present

## 2018-05-24 DIAGNOSIS — I5043 Acute on chronic combined systolic (congestive) and diastolic (congestive) heart failure: Secondary | ICD-10-CM | POA: Diagnosis not present

## 2018-05-24 DIAGNOSIS — I87313 Chronic venous hypertension (idiopathic) with ulcer of bilateral lower extremity: Secondary | ICD-10-CM | POA: Diagnosis not present

## 2018-05-24 DIAGNOSIS — S81802A Unspecified open wound, left lower leg, initial encounter: Secondary | ICD-10-CM | POA: Diagnosis not present

## 2018-05-24 DIAGNOSIS — J4540 Moderate persistent asthma, uncomplicated: Secondary | ICD-10-CM | POA: Diagnosis not present

## 2018-05-24 DIAGNOSIS — J9601 Acute respiratory failure with hypoxia: Secondary | ICD-10-CM | POA: Diagnosis not present

## 2018-05-24 DIAGNOSIS — I87312 Chronic venous hypertension (idiopathic) with ulcer of left lower extremity: Secondary | ICD-10-CM | POA: Diagnosis not present

## 2018-05-24 DIAGNOSIS — J441 Chronic obstructive pulmonary disease with (acute) exacerbation: Secondary | ICD-10-CM | POA: Diagnosis not present

## 2018-05-24 DIAGNOSIS — I2510 Atherosclerotic heart disease of native coronary artery without angina pectoris: Secondary | ICD-10-CM | POA: Diagnosis not present

## 2018-05-24 DIAGNOSIS — I130 Hypertensive heart and chronic kidney disease with heart failure and stage 1 through stage 4 chronic kidney disease, or unspecified chronic kidney disease: Secondary | ICD-10-CM | POA: Diagnosis not present

## 2018-05-24 DIAGNOSIS — D649 Anemia, unspecified: Secondary | ICD-10-CM | POA: Diagnosis not present

## 2018-05-24 DIAGNOSIS — S81801A Unspecified open wound, right lower leg, initial encounter: Secondary | ICD-10-CM | POA: Diagnosis not present

## 2018-05-25 DIAGNOSIS — J9601 Acute respiratory failure with hypoxia: Secondary | ICD-10-CM | POA: Diagnosis not present

## 2018-05-25 DIAGNOSIS — S81801A Unspecified open wound, right lower leg, initial encounter: Secondary | ICD-10-CM | POA: Diagnosis not present

## 2018-05-25 DIAGNOSIS — J4540 Moderate persistent asthma, uncomplicated: Secondary | ICD-10-CM | POA: Diagnosis not present

## 2018-05-25 DIAGNOSIS — J441 Chronic obstructive pulmonary disease with (acute) exacerbation: Secondary | ICD-10-CM | POA: Diagnosis not present

## 2018-05-25 DIAGNOSIS — I5043 Acute on chronic combined systolic (congestive) and diastolic (congestive) heart failure: Secondary | ICD-10-CM | POA: Diagnosis not present

## 2018-05-25 DIAGNOSIS — I130 Hypertensive heart and chronic kidney disease with heart failure and stage 1 through stage 4 chronic kidney disease, or unspecified chronic kidney disease: Secondary | ICD-10-CM | POA: Diagnosis not present

## 2018-05-26 DIAGNOSIS — I130 Hypertensive heart and chronic kidney disease with heart failure and stage 1 through stage 4 chronic kidney disease, or unspecified chronic kidney disease: Secondary | ICD-10-CM | POA: Diagnosis not present

## 2018-05-26 DIAGNOSIS — J9601 Acute respiratory failure with hypoxia: Secondary | ICD-10-CM | POA: Diagnosis not present

## 2018-05-26 DIAGNOSIS — J441 Chronic obstructive pulmonary disease with (acute) exacerbation: Secondary | ICD-10-CM | POA: Diagnosis not present

## 2018-05-26 DIAGNOSIS — I5043 Acute on chronic combined systolic (congestive) and diastolic (congestive) heart failure: Secondary | ICD-10-CM | POA: Diagnosis not present

## 2018-05-26 DIAGNOSIS — I5032 Chronic diastolic (congestive) heart failure: Secondary | ICD-10-CM | POA: Diagnosis not present

## 2018-05-26 DIAGNOSIS — J984 Other disorders of lung: Secondary | ICD-10-CM | POA: Diagnosis not present

## 2018-05-26 DIAGNOSIS — R799 Abnormal finding of blood chemistry, unspecified: Secondary | ICD-10-CM | POA: Diagnosis not present

## 2018-05-26 DIAGNOSIS — J4540 Moderate persistent asthma, uncomplicated: Secondary | ICD-10-CM | POA: Diagnosis not present

## 2018-05-26 DIAGNOSIS — S81801A Unspecified open wound, right lower leg, initial encounter: Secondary | ICD-10-CM | POA: Diagnosis not present

## 2018-05-26 DIAGNOSIS — R0609 Other forms of dyspnea: Secondary | ICD-10-CM | POA: Diagnosis not present

## 2018-05-27 DIAGNOSIS — J984 Other disorders of lung: Secondary | ICD-10-CM | POA: Diagnosis not present

## 2018-05-27 DIAGNOSIS — J90 Pleural effusion, not elsewhere classified: Secondary | ICD-10-CM | POA: Diagnosis not present

## 2018-05-27 DIAGNOSIS — D631 Anemia in chronic kidney disease: Secondary | ICD-10-CM | POA: Diagnosis not present

## 2018-05-27 DIAGNOSIS — J9811 Atelectasis: Secondary | ICD-10-CM | POA: Diagnosis not present

## 2018-05-27 DIAGNOSIS — I5032 Chronic diastolic (congestive) heart failure: Secondary | ICD-10-CM | POA: Diagnosis not present

## 2018-05-27 DIAGNOSIS — N183 Chronic kidney disease, stage 3 (moderate): Secondary | ICD-10-CM | POA: Diagnosis not present

## 2018-05-27 DIAGNOSIS — R0609 Other forms of dyspnea: Secondary | ICD-10-CM | POA: Diagnosis not present

## 2018-05-27 DIAGNOSIS — R799 Abnormal finding of blood chemistry, unspecified: Secondary | ICD-10-CM | POA: Diagnosis not present

## 2018-05-29 DIAGNOSIS — I5043 Acute on chronic combined systolic (congestive) and diastolic (congestive) heart failure: Secondary | ICD-10-CM | POA: Diagnosis not present

## 2018-05-29 DIAGNOSIS — J9601 Acute respiratory failure with hypoxia: Secondary | ICD-10-CM | POA: Diagnosis not present

## 2018-05-29 DIAGNOSIS — J441 Chronic obstructive pulmonary disease with (acute) exacerbation: Secondary | ICD-10-CM | POA: Diagnosis not present

## 2018-05-29 DIAGNOSIS — J4540 Moderate persistent asthma, uncomplicated: Secondary | ICD-10-CM | POA: Diagnosis not present

## 2018-05-29 DIAGNOSIS — I130 Hypertensive heart and chronic kidney disease with heart failure and stage 1 through stage 4 chronic kidney disease, or unspecified chronic kidney disease: Secondary | ICD-10-CM | POA: Diagnosis not present

## 2018-05-29 DIAGNOSIS — S81801A Unspecified open wound, right lower leg, initial encounter: Secondary | ICD-10-CM | POA: Diagnosis not present

## 2018-05-30 DIAGNOSIS — K5520 Angiodysplasia of colon without hemorrhage: Secondary | ICD-10-CM | POA: Diagnosis not present

## 2018-05-30 DIAGNOSIS — D500 Iron deficiency anemia secondary to blood loss (chronic): Secondary | ICD-10-CM | POA: Diagnosis not present

## 2018-05-31 DIAGNOSIS — L97822 Non-pressure chronic ulcer of other part of left lower leg with fat layer exposed: Secondary | ICD-10-CM | POA: Diagnosis not present

## 2018-05-31 DIAGNOSIS — S81802A Unspecified open wound, left lower leg, initial encounter: Secondary | ICD-10-CM | POA: Diagnosis not present

## 2018-05-31 DIAGNOSIS — D649 Anemia, unspecified: Secondary | ICD-10-CM | POA: Diagnosis not present

## 2018-05-31 DIAGNOSIS — S81801A Unspecified open wound, right lower leg, initial encounter: Secondary | ICD-10-CM | POA: Diagnosis not present

## 2018-05-31 DIAGNOSIS — I87312 Chronic venous hypertension (idiopathic) with ulcer of left lower extremity: Secondary | ICD-10-CM | POA: Diagnosis not present

## 2018-05-31 DIAGNOSIS — I87313 Chronic venous hypertension (idiopathic) with ulcer of bilateral lower extremity: Secondary | ICD-10-CM | POA: Diagnosis not present

## 2018-05-31 DIAGNOSIS — I2510 Atherosclerotic heart disease of native coronary artery without angina pectoris: Secondary | ICD-10-CM | POA: Diagnosis not present

## 2018-06-01 ENCOUNTER — Telehealth: Payer: Self-pay | Admitting: *Deleted

## 2018-06-01 DIAGNOSIS — I130 Hypertensive heart and chronic kidney disease with heart failure and stage 1 through stage 4 chronic kidney disease, or unspecified chronic kidney disease: Secondary | ICD-10-CM | POA: Diagnosis not present

## 2018-06-01 DIAGNOSIS — J441 Chronic obstructive pulmonary disease with (acute) exacerbation: Secondary | ICD-10-CM | POA: Diagnosis not present

## 2018-06-01 DIAGNOSIS — I5043 Acute on chronic combined systolic (congestive) and diastolic (congestive) heart failure: Secondary | ICD-10-CM | POA: Diagnosis not present

## 2018-06-01 DIAGNOSIS — J4540 Moderate persistent asthma, uncomplicated: Secondary | ICD-10-CM | POA: Diagnosis not present

## 2018-06-01 DIAGNOSIS — S81801A Unspecified open wound, right lower leg, initial encounter: Secondary | ICD-10-CM | POA: Diagnosis not present

## 2018-06-01 DIAGNOSIS — J9601 Acute respiratory failure with hypoxia: Secondary | ICD-10-CM | POA: Diagnosis not present

## 2018-06-01 NOTE — Telephone Encounter (Signed)
Patient called and left message on Clinical intake stating that he had questions regarding services in [place]. 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   Tried calling patient back and could not leave message, stated that memory was full. Forwarded message.

## 2018-06-01 NOTE — Telephone Encounter (Signed)
Tried calling home and cell number, both not available, also no answering machine.

## 2018-06-02 NOTE — Telephone Encounter (Signed)
Noted.  Please try again today.

## 2018-06-02 NOTE — Telephone Encounter (Signed)
Phone still not available, no answering machine

## 2018-06-03 DIAGNOSIS — J9601 Acute respiratory failure with hypoxia: Secondary | ICD-10-CM | POA: Diagnosis not present

## 2018-06-03 DIAGNOSIS — I130 Hypertensive heart and chronic kidney disease with heart failure and stage 1 through stage 4 chronic kidney disease, or unspecified chronic kidney disease: Secondary | ICD-10-CM | POA: Diagnosis not present

## 2018-06-03 DIAGNOSIS — I5043 Acute on chronic combined systolic (congestive) and diastolic (congestive) heart failure: Secondary | ICD-10-CM | POA: Diagnosis not present

## 2018-06-03 DIAGNOSIS — S81801A Unspecified open wound, right lower leg, initial encounter: Secondary | ICD-10-CM | POA: Diagnosis not present

## 2018-06-03 DIAGNOSIS — J4540 Moderate persistent asthma, uncomplicated: Secondary | ICD-10-CM | POA: Diagnosis not present

## 2018-06-03 DIAGNOSIS — J441 Chronic obstructive pulmonary disease with (acute) exacerbation: Secondary | ICD-10-CM | POA: Diagnosis not present

## 2018-06-06 DIAGNOSIS — N393 Stress incontinence (female) (male): Secondary | ICD-10-CM | POA: Diagnosis not present

## 2018-06-06 DIAGNOSIS — C61 Malignant neoplasm of prostate: Secondary | ICD-10-CM | POA: Diagnosis not present

## 2018-06-06 DIAGNOSIS — Z87891 Personal history of nicotine dependence: Secondary | ICD-10-CM | POA: Diagnosis not present

## 2018-06-06 DIAGNOSIS — N200 Calculus of kidney: Secondary | ICD-10-CM | POA: Diagnosis not present

## 2018-06-06 DIAGNOSIS — Z139 Encounter for screening, unspecified: Secondary | ICD-10-CM | POA: Diagnosis not present

## 2018-06-06 NOTE — Telephone Encounter (Signed)
Still no answer from home phone or cell phone, can not leave a VM, I will close out this message until pt calls again.

## 2018-06-07 DIAGNOSIS — I87313 Chronic venous hypertension (idiopathic) with ulcer of bilateral lower extremity: Secondary | ICD-10-CM | POA: Diagnosis not present

## 2018-06-07 DIAGNOSIS — D649 Anemia, unspecified: Secondary | ICD-10-CM | POA: Diagnosis not present

## 2018-06-07 DIAGNOSIS — L97822 Non-pressure chronic ulcer of other part of left lower leg with fat layer exposed: Secondary | ICD-10-CM | POA: Diagnosis not present

## 2018-06-07 DIAGNOSIS — S81801A Unspecified open wound, right lower leg, initial encounter: Secondary | ICD-10-CM | POA: Diagnosis not present

## 2018-06-07 DIAGNOSIS — I2510 Atherosclerotic heart disease of native coronary artery without angina pectoris: Secondary | ICD-10-CM | POA: Diagnosis not present

## 2018-06-07 DIAGNOSIS — S81802A Unspecified open wound, left lower leg, initial encounter: Secondary | ICD-10-CM | POA: Diagnosis not present

## 2018-06-08 DIAGNOSIS — D509 Iron deficiency anemia, unspecified: Secondary | ICD-10-CM | POA: Diagnosis not present

## 2018-06-09 DIAGNOSIS — R0609 Other forms of dyspnea: Secondary | ICD-10-CM | POA: Diagnosis not present

## 2018-06-09 DIAGNOSIS — J984 Other disorders of lung: Secondary | ICD-10-CM | POA: Diagnosis not present

## 2018-06-09 DIAGNOSIS — J4540 Moderate persistent asthma, uncomplicated: Secondary | ICD-10-CM | POA: Diagnosis not present

## 2018-06-10 DIAGNOSIS — D631 Anemia in chronic kidney disease: Secondary | ICD-10-CM | POA: Diagnosis not present

## 2018-06-10 DIAGNOSIS — R799 Abnormal finding of blood chemistry, unspecified: Secondary | ICD-10-CM | POA: Diagnosis not present

## 2018-06-10 DIAGNOSIS — I5032 Chronic diastolic (congestive) heart failure: Secondary | ICD-10-CM | POA: Diagnosis not present

## 2018-06-10 DIAGNOSIS — N183 Chronic kidney disease, stage 3 (moderate): Secondary | ICD-10-CM | POA: Diagnosis not present

## 2018-06-10 DIAGNOSIS — R0609 Other forms of dyspnea: Secondary | ICD-10-CM | POA: Diagnosis not present

## 2018-06-14 DIAGNOSIS — I2510 Atherosclerotic heart disease of native coronary artery without angina pectoris: Secondary | ICD-10-CM | POA: Diagnosis not present

## 2018-06-14 DIAGNOSIS — E1122 Type 2 diabetes mellitus with diabetic chronic kidney disease: Secondary | ICD-10-CM | POA: Diagnosis not present

## 2018-06-14 DIAGNOSIS — Z87891 Personal history of nicotine dependence: Secondary | ICD-10-CM | POA: Diagnosis not present

## 2018-06-14 DIAGNOSIS — J90 Pleural effusion, not elsewhere classified: Secondary | ICD-10-CM | POA: Diagnosis not present

## 2018-06-14 DIAGNOSIS — K922 Gastrointestinal hemorrhage, unspecified: Secondary | ICD-10-CM | POA: Diagnosis not present

## 2018-06-14 DIAGNOSIS — Z9981 Dependence on supplemental oxygen: Secondary | ICD-10-CM | POA: Diagnosis not present

## 2018-06-14 DIAGNOSIS — Z955 Presence of coronary angioplasty implant and graft: Secondary | ICD-10-CM | POA: Diagnosis not present

## 2018-06-14 DIAGNOSIS — I509 Heart failure, unspecified: Secondary | ICD-10-CM | POA: Diagnosis not present

## 2018-06-14 DIAGNOSIS — R0602 Shortness of breath: Secondary | ICD-10-CM | POA: Diagnosis not present

## 2018-06-14 DIAGNOSIS — I878 Other specified disorders of veins: Secondary | ICD-10-CM | POA: Diagnosis present

## 2018-06-14 DIAGNOSIS — K6389 Other specified diseases of intestine: Secondary | ICD-10-CM | POA: Diagnosis not present

## 2018-06-14 DIAGNOSIS — I482 Chronic atrial fibrillation: Secondary | ICD-10-CM | POA: Diagnosis not present

## 2018-06-14 DIAGNOSIS — N183 Chronic kidney disease, stage 3 (moderate): Secondary | ICD-10-CM | POA: Diagnosis not present

## 2018-06-14 DIAGNOSIS — Z923 Personal history of irradiation: Secondary | ICD-10-CM | POA: Diagnosis not present

## 2018-06-14 DIAGNOSIS — I5043 Acute on chronic combined systolic (congestive) and diastolic (congestive) heart failure: Secondary | ICD-10-CM | POA: Diagnosis not present

## 2018-06-14 DIAGNOSIS — E6609 Other obesity due to excess calories: Secondary | ICD-10-CM | POA: Diagnosis present

## 2018-06-14 DIAGNOSIS — Z6834 Body mass index (BMI) 34.0-34.9, adult: Secondary | ICD-10-CM | POA: Diagnosis not present

## 2018-06-14 DIAGNOSIS — E876 Hypokalemia: Secondary | ICD-10-CM | POA: Diagnosis not present

## 2018-06-14 DIAGNOSIS — R0902 Hypoxemia: Secondary | ICD-10-CM | POA: Diagnosis present

## 2018-06-14 DIAGNOSIS — D649 Anemia, unspecified: Secondary | ICD-10-CM | POA: Diagnosis not present

## 2018-06-14 DIAGNOSIS — D631 Anemia in chronic kidney disease: Secondary | ICD-10-CM | POA: Diagnosis not present

## 2018-06-14 DIAGNOSIS — R0600 Dyspnea, unspecified: Secondary | ICD-10-CM | POA: Diagnosis not present

## 2018-06-14 DIAGNOSIS — I4891 Unspecified atrial fibrillation: Secondary | ICD-10-CM | POA: Diagnosis not present

## 2018-06-14 DIAGNOSIS — K2900 Acute gastritis without bleeding: Secondary | ICD-10-CM | POA: Diagnosis present

## 2018-06-14 DIAGNOSIS — J4540 Moderate persistent asthma, uncomplicated: Secondary | ICD-10-CM | POA: Diagnosis present

## 2018-06-14 DIAGNOSIS — K5521 Angiodysplasia of colon with hemorrhage: Secondary | ICD-10-CM | POA: Diagnosis not present

## 2018-06-14 DIAGNOSIS — J811 Chronic pulmonary edema: Secondary | ICD-10-CM | POA: Diagnosis not present

## 2018-06-14 DIAGNOSIS — J9611 Chronic respiratory failure with hypoxia: Secondary | ICD-10-CM | POA: Diagnosis not present

## 2018-06-14 DIAGNOSIS — E039 Hypothyroidism, unspecified: Secondary | ICD-10-CM | POA: Diagnosis present

## 2018-06-14 DIAGNOSIS — I252 Old myocardial infarction: Secondary | ICD-10-CM | POA: Diagnosis not present

## 2018-06-14 DIAGNOSIS — J984 Other disorders of lung: Secondary | ICD-10-CM | POA: Diagnosis not present

## 2018-06-14 DIAGNOSIS — C61 Malignant neoplasm of prostate: Secondary | ICD-10-CM | POA: Diagnosis not present

## 2018-06-14 DIAGNOSIS — I130 Hypertensive heart and chronic kidney disease with heart failure and stage 1 through stage 4 chronic kidney disease, or unspecified chronic kidney disease: Secondary | ICD-10-CM | POA: Diagnosis not present

## 2018-06-14 DIAGNOSIS — J449 Chronic obstructive pulmonary disease, unspecified: Secondary | ICD-10-CM | POA: Diagnosis not present

## 2018-06-15 ENCOUNTER — Inpatient Hospital Stay: Payer: Medicare Other

## 2018-06-15 ENCOUNTER — Inpatient Hospital Stay: Payer: Medicare Other | Admitting: Oncology

## 2018-06-19 DIAGNOSIS — K5710 Diverticulosis of small intestine without perforation or abscess without bleeding: Secondary | ICD-10-CM | POA: Diagnosis not present

## 2018-06-19 DIAGNOSIS — E669 Obesity, unspecified: Secondary | ICD-10-CM | POA: Diagnosis present

## 2018-06-19 DIAGNOSIS — J4540 Moderate persistent asthma, uncomplicated: Secondary | ICD-10-CM | POA: Diagnosis not present

## 2018-06-19 DIAGNOSIS — E039 Hypothyroidism, unspecified: Secondary | ICD-10-CM | POA: Diagnosis not present

## 2018-06-19 DIAGNOSIS — N183 Chronic kidney disease, stage 3 (moderate): Secondary | ICD-10-CM | POA: Diagnosis not present

## 2018-06-19 DIAGNOSIS — K6389 Other specified diseases of intestine: Secondary | ICD-10-CM | POA: Diagnosis not present

## 2018-06-19 DIAGNOSIS — I998 Other disorder of circulatory system: Secondary | ICD-10-CM | POA: Diagnosis present

## 2018-06-19 DIAGNOSIS — Z9981 Dependence on supplemental oxygen: Secondary | ICD-10-CM | POA: Diagnosis not present

## 2018-06-19 DIAGNOSIS — J811 Chronic pulmonary edema: Secondary | ICD-10-CM | POA: Diagnosis not present

## 2018-06-19 DIAGNOSIS — Z8546 Personal history of malignant neoplasm of prostate: Secondary | ICD-10-CM | POA: Diagnosis not present

## 2018-06-19 DIAGNOSIS — D72829 Elevated white blood cell count, unspecified: Secondary | ICD-10-CM | POA: Diagnosis present

## 2018-06-19 DIAGNOSIS — I130 Hypertensive heart and chronic kidney disease with heart failure and stage 1 through stage 4 chronic kidney disease, or unspecified chronic kidney disease: Secondary | ICD-10-CM | POA: Diagnosis not present

## 2018-06-19 DIAGNOSIS — K922 Gastrointestinal hemorrhage, unspecified: Secondary | ICD-10-CM | POA: Diagnosis not present

## 2018-06-19 DIAGNOSIS — R231 Pallor: Secondary | ICD-10-CM | POA: Diagnosis present

## 2018-06-19 DIAGNOSIS — Z87891 Personal history of nicotine dependence: Secondary | ICD-10-CM | POA: Diagnosis not present

## 2018-06-19 DIAGNOSIS — R0602 Shortness of breath: Secondary | ICD-10-CM | POA: Diagnosis not present

## 2018-06-19 DIAGNOSIS — I4891 Unspecified atrial fibrillation: Secondary | ICD-10-CM | POA: Diagnosis not present

## 2018-06-19 DIAGNOSIS — Z955 Presence of coronary angioplasty implant and graft: Secondary | ICD-10-CM | POA: Diagnosis not present

## 2018-06-19 DIAGNOSIS — D649 Anemia, unspecified: Secondary | ICD-10-CM | POA: Diagnosis not present

## 2018-06-19 DIAGNOSIS — R918 Other nonspecific abnormal finding of lung field: Secondary | ICD-10-CM | POA: Diagnosis not present

## 2018-06-19 DIAGNOSIS — Z9119 Patient's noncompliance with other medical treatment and regimen: Secondary | ICD-10-CM | POA: Diagnosis not present

## 2018-06-19 DIAGNOSIS — I2510 Atherosclerotic heart disease of native coronary artery without angina pectoris: Secondary | ICD-10-CM | POA: Diagnosis not present

## 2018-06-19 DIAGNOSIS — J449 Chronic obstructive pulmonary disease, unspecified: Secondary | ICD-10-CM | POA: Diagnosis not present

## 2018-06-19 DIAGNOSIS — J90 Pleural effusion, not elsewhere classified: Secondary | ICD-10-CM | POA: Diagnosis not present

## 2018-06-19 DIAGNOSIS — D631 Anemia in chronic kidney disease: Secondary | ICD-10-CM | POA: Diagnosis not present

## 2018-06-19 DIAGNOSIS — Z923 Personal history of irradiation: Secondary | ICD-10-CM | POA: Diagnosis not present

## 2018-06-19 DIAGNOSIS — R0609 Other forms of dyspnea: Secondary | ICD-10-CM | POA: Diagnosis not present

## 2018-06-19 DIAGNOSIS — K5521 Angiodysplasia of colon with hemorrhage: Secondary | ICD-10-CM | POA: Diagnosis not present

## 2018-06-19 DIAGNOSIS — R000 Tachycardia, unspecified: Secondary | ICD-10-CM | POA: Diagnosis not present

## 2018-06-19 DIAGNOSIS — Z6836 Body mass index (BMI) 36.0-36.9, adult: Secondary | ICD-10-CM | POA: Diagnosis not present

## 2018-06-19 DIAGNOSIS — I252 Old myocardial infarction: Secondary | ICD-10-CM | POA: Diagnosis not present

## 2018-06-19 DIAGNOSIS — I5020 Unspecified systolic (congestive) heart failure: Secondary | ICD-10-CM | POA: Diagnosis not present

## 2018-06-19 DIAGNOSIS — I10 Essential (primary) hypertension: Secondary | ICD-10-CM | POA: Diagnosis not present

## 2018-06-19 DIAGNOSIS — N200 Calculus of kidney: Secondary | ICD-10-CM | POA: Diagnosis not present

## 2018-06-19 DIAGNOSIS — I5032 Chronic diastolic (congestive) heart failure: Secondary | ICD-10-CM | POA: Diagnosis not present

## 2018-06-19 DIAGNOSIS — I5043 Acute on chronic combined systolic (congestive) and diastolic (congestive) heart failure: Secondary | ICD-10-CM | POA: Diagnosis not present

## 2018-06-22 ENCOUNTER — Encounter: Payer: Self-pay | Admitting: Internal Medicine

## 2018-06-23 DIAGNOSIS — Z923 Personal history of irradiation: Secondary | ICD-10-CM | POA: Diagnosis not present

## 2018-06-23 DIAGNOSIS — S81802A Unspecified open wound, left lower leg, initial encounter: Secondary | ICD-10-CM | POA: Diagnosis not present

## 2018-06-23 DIAGNOSIS — I87333 Chronic venous hypertension (idiopathic) with ulcer and inflammation of bilateral lower extremity: Secondary | ICD-10-CM | POA: Diagnosis not present

## 2018-06-23 DIAGNOSIS — Z23 Encounter for immunization: Secondary | ICD-10-CM | POA: Diagnosis not present

## 2018-06-23 DIAGNOSIS — N183 Chronic kidney disease, stage 3 (moderate): Secondary | ICD-10-CM | POA: Diagnosis not present

## 2018-06-23 DIAGNOSIS — S81801A Unspecified open wound, right lower leg, initial encounter: Secondary | ICD-10-CM | POA: Diagnosis not present

## 2018-06-23 DIAGNOSIS — Z9981 Dependence on supplemental oxygen: Secondary | ICD-10-CM | POA: Diagnosis not present

## 2018-06-23 DIAGNOSIS — Z8546 Personal history of malignant neoplasm of prostate: Secondary | ICD-10-CM | POA: Diagnosis not present

## 2018-06-23 DIAGNOSIS — J984 Other disorders of lung: Secondary | ICD-10-CM | POA: Diagnosis not present

## 2018-06-23 DIAGNOSIS — D631 Anemia in chronic kidney disease: Secondary | ICD-10-CM | POA: Diagnosis not present

## 2018-06-27 DIAGNOSIS — J984 Other disorders of lung: Secondary | ICD-10-CM | POA: Diagnosis not present

## 2018-06-28 DIAGNOSIS — Z1331 Encounter for screening for depression: Secondary | ICD-10-CM | POA: Diagnosis not present

## 2018-06-28 DIAGNOSIS — Z9981 Dependence on supplemental oxygen: Secondary | ICD-10-CM | POA: Diagnosis not present

## 2018-06-28 DIAGNOSIS — R600 Localized edema: Secondary | ICD-10-CM | POA: Diagnosis not present

## 2018-06-28 DIAGNOSIS — J431 Panlobular emphysema: Secondary | ICD-10-CM | POA: Diagnosis not present

## 2018-06-30 DIAGNOSIS — I87333 Chronic venous hypertension (idiopathic) with ulcer and inflammation of bilateral lower extremity: Secondary | ICD-10-CM | POA: Diagnosis not present

## 2018-06-30 DIAGNOSIS — S81802A Unspecified open wound, left lower leg, initial encounter: Secondary | ICD-10-CM | POA: Diagnosis not present

## 2018-06-30 DIAGNOSIS — S81801A Unspecified open wound, right lower leg, initial encounter: Secondary | ICD-10-CM | POA: Diagnosis not present

## 2018-06-30 DIAGNOSIS — J984 Other disorders of lung: Secondary | ICD-10-CM | POA: Diagnosis not present

## 2018-07-05 DIAGNOSIS — K5520 Angiodysplasia of colon without hemorrhage: Secondary | ICD-10-CM | POA: Diagnosis not present

## 2018-07-05 DIAGNOSIS — J984 Other disorders of lung: Secondary | ICD-10-CM | POA: Diagnosis not present

## 2018-07-06 DIAGNOSIS — R799 Abnormal finding of blood chemistry, unspecified: Secondary | ICD-10-CM | POA: Diagnosis not present

## 2018-07-06 DIAGNOSIS — J4540 Moderate persistent asthma, uncomplicated: Secondary | ICD-10-CM | POA: Diagnosis not present

## 2018-07-06 DIAGNOSIS — I5032 Chronic diastolic (congestive) heart failure: Secondary | ICD-10-CM | POA: Diagnosis not present

## 2018-07-06 DIAGNOSIS — N183 Chronic kidney disease, stage 3 (moderate): Secondary | ICD-10-CM | POA: Diagnosis not present

## 2018-07-06 DIAGNOSIS — R0609 Other forms of dyspnea: Secondary | ICD-10-CM | POA: Diagnosis not present

## 2018-07-07 DIAGNOSIS — E7800 Pure hypercholesterolemia, unspecified: Secondary | ICD-10-CM | POA: Diagnosis not present

## 2018-07-07 DIAGNOSIS — I519 Heart disease, unspecified: Secondary | ICD-10-CM | POA: Diagnosis not present

## 2018-07-07 DIAGNOSIS — I252 Old myocardial infarction: Secondary | ICD-10-CM | POA: Diagnosis not present

## 2018-07-07 DIAGNOSIS — R799 Abnormal finding of blood chemistry, unspecified: Secondary | ICD-10-CM | POA: Diagnosis not present

## 2018-07-07 DIAGNOSIS — N183 Chronic kidney disease, stage 3 (moderate): Secondary | ICD-10-CM | POA: Diagnosis not present

## 2018-07-07 DIAGNOSIS — Z923 Personal history of irradiation: Secondary | ICD-10-CM | POA: Diagnosis not present

## 2018-07-07 DIAGNOSIS — J431 Panlobular emphysema: Secondary | ICD-10-CM | POA: Diagnosis not present

## 2018-07-07 DIAGNOSIS — Z9981 Dependence on supplemental oxygen: Secondary | ICD-10-CM | POA: Diagnosis not present

## 2018-07-07 DIAGNOSIS — I2510 Atherosclerotic heart disease of native coronary artery without angina pectoris: Secondary | ICD-10-CM | POA: Diagnosis not present

## 2018-07-07 DIAGNOSIS — I6523 Occlusion and stenosis of bilateral carotid arteries: Secondary | ICD-10-CM | POA: Diagnosis not present

## 2018-07-07 DIAGNOSIS — Z5309 Procedure and treatment not carried out because of other contraindication: Secondary | ICD-10-CM | POA: Diagnosis not present

## 2018-07-07 DIAGNOSIS — I5032 Chronic diastolic (congestive) heart failure: Secondary | ICD-10-CM | POA: Diagnosis not present

## 2018-07-07 DIAGNOSIS — Z955 Presence of coronary angioplasty implant and graft: Secondary | ICD-10-CM | POA: Diagnosis not present

## 2018-07-07 DIAGNOSIS — J984 Other disorders of lung: Secondary | ICD-10-CM | POA: Diagnosis not present

## 2018-07-07 DIAGNOSIS — K922 Gastrointestinal hemorrhage, unspecified: Secondary | ICD-10-CM | POA: Diagnosis not present

## 2018-07-07 DIAGNOSIS — I4819 Other persistent atrial fibrillation: Secondary | ICD-10-CM | POA: Diagnosis not present

## 2018-07-07 DIAGNOSIS — I2721 Secondary pulmonary arterial hypertension: Secondary | ICD-10-CM | POA: Diagnosis not present

## 2018-07-07 DIAGNOSIS — D631 Anemia in chronic kidney disease: Secondary | ICD-10-CM | POA: Diagnosis not present

## 2018-07-07 DIAGNOSIS — Z8546 Personal history of malignant neoplasm of prostate: Secondary | ICD-10-CM | POA: Diagnosis not present

## 2018-07-08 ENCOUNTER — Telehealth: Payer: Self-pay | Admitting: Internal Medicine

## 2018-07-08 NOTE — Telephone Encounter (Signed)
Called and spoke with Corene Cornea who stated due to pt not being seen since March 2019 and at that visit with pt not being on O2, pt would need to come in to the office for an OV and to have a qualifying walk so we can show from our office that he needs the O2  Per Corene Cornea, due to pt being in [place] for more than an a year, [facility] cannot take pt on as a pt. Also, per Corene Cornea, pt's payments for the O2 were not all done by insurance with his previous O2 that he has in [place]  Per [place], his pulmonologist can write pt an Rx for his liter flow and with that, pt can private pay for tanks and equipment until pt comes in for the OV and qualifying walk.  Also, per Corene Cornea, if pt does travel each year back and forth to [place], pt needs to go with a DME of [employer] or [employer], a company that is national that will be able to provide O2 for him in [place] and [place].  Called and spoke with pt stating to him the above information that I found out. Stated to him that he would not be able to use [facility] due to him traveling from [facility] to [facility] each year and he would have to use either [employer] or [employer]. Pt expressed understanding. I gave pt the phone number and fax number for [facility] at his request so he could give them a call. Nothing further needed.

## 2018-07-08 NOTE — Telephone Encounter (Signed)
Called and spoke with pt who stated he is needing an order placed and sent to [facility] for O2 as pt states he needs O2.  I looked at pt's last OV with MW 11/29/17 and at this visit, there is no documentation of pt being on O2.  Pt states he goes to [place] during the winter and is in [place] during the summer. Pt is scheduled for an OV with MW 07/25/18 which pt states when he lands in  [place], he will need the O2.  Due to pt not being on the O2 at last OV, and since we do not have any documentation, I stated to pt that we will need him to do a qualifying walk to show that he does need the O2. Pt states there is documentation with his pulmonologist that he has in [place] that shows that he needs to have the O2. Pt states he uses 2L during the day, 3L with exertion, and 3L at night.  I stated to pt that we would have to call [place] to see what they said about [place] placing an order for pt to see if it would be allowed for [place] to do with documentation from his pulmonologist in [place].  Attempted to call [place] to speak to McBride but unable to reach him. Left message for Corene Cornea to return call.

## 2018-07-11 DIAGNOSIS — J984 Other disorders of lung: Secondary | ICD-10-CM | POA: Diagnosis not present

## 2018-07-12 DIAGNOSIS — J984 Other disorders of lung: Secondary | ICD-10-CM | POA: Diagnosis not present

## 2018-07-14 DIAGNOSIS — J984 Other disorders of lung: Secondary | ICD-10-CM | POA: Diagnosis not present

## 2018-07-14 DIAGNOSIS — S81802A Unspecified open wound, left lower leg, initial encounter: Secondary | ICD-10-CM | POA: Diagnosis not present

## 2018-07-14 DIAGNOSIS — S81801A Unspecified open wound, right lower leg, initial encounter: Secondary | ICD-10-CM | POA: Diagnosis not present

## 2018-07-14 DIAGNOSIS — I87333 Chronic venous hypertension (idiopathic) with ulcer and inflammation of bilateral lower extremity: Secondary | ICD-10-CM | POA: Diagnosis not present

## 2018-07-18 DIAGNOSIS — J984 Other disorders of lung: Secondary | ICD-10-CM | POA: Diagnosis not present

## 2018-07-20 ENCOUNTER — Telehealth: Payer: Self-pay | Admitting: Oncology

## 2018-07-20 ENCOUNTER — Encounter: Payer: Medicare Other | Admitting: Internal Medicine

## 2018-07-20 NOTE — Telephone Encounter (Signed)
Tried to reach regarding voicemail °

## 2018-07-21 ENCOUNTER — Telehealth: Payer: Self-pay | Admitting: Oncology

## 2018-07-21 ENCOUNTER — Inpatient Hospital Stay: Payer: Medicare Other | Attending: Oncology

## 2018-07-21 ENCOUNTER — Inpatient Hospital Stay: Payer: Medicare Other

## 2018-07-21 ENCOUNTER — Inpatient Hospital Stay: Payer: Medicare Other | Admitting: Oncology

## 2018-07-21 NOTE — Telephone Encounter (Signed)
Called both numbers - unable to reach pt - left message for patient to call back to r/s - per FS okay to schedule 11/4 @ 1030

## 2018-07-25 ENCOUNTER — Encounter: Payer: Self-pay | Admitting: Internal Medicine

## 2018-07-25 ENCOUNTER — Telehealth: Payer: Self-pay

## 2018-07-25 ENCOUNTER — Telehealth: Payer: Self-pay | Admitting: Internal Medicine

## 2018-07-25 ENCOUNTER — Ambulatory Visit (INDEPENDENT_AMBULATORY_CARE_PROVIDER_SITE_OTHER): Payer: Medicare Other | Admitting: Internal Medicine

## 2018-07-25 DIAGNOSIS — J449 Chronic obstructive pulmonary disease, unspecified: Secondary | ICD-10-CM

## 2018-07-25 DIAGNOSIS — J9611 Chronic respiratory failure with hypoxia: Secondary | ICD-10-CM | POA: Diagnosis not present

## 2018-07-25 NOTE — Telephone Encounter (Signed)
Left a detailed msg concerning upcoming appointment that patient requested to be r/s.Per 11/4 vm return calls. Mailed a letter with a calender enclosed

## 2018-07-25 NOTE — Telephone Encounter (Signed)
I left a message asking the patient to call me at [phone] to schedule AWV with Sara at [facility] on Nov. 5th if available. If not, then she will also be there on Nov. 19th. VDM (DD) °

## 2018-07-25 NOTE — Patient Instructions (Signed)
Adjust your 02 to keep sats > 90% walking    Please schedule a follow up office visit in 4 weeks, sooner if needed

## 2018-07-25 NOTE — Progress Notes (Signed)
Subjective:    Patient ID: Kevin Kane, male   DOB: [birth year],    MRN: [id number]      Brief patient profile:  38 yowm went to [place]/ [place]   Quit smoking in 1960 s obvious sequelae self referred to pulmonary clinic  07/01/2017 cc sob with evidence of asthma vs copd  on pfts from [place]  From 06/02/17 and over use of saba chronically    History of Present Illness  07/01/2017 [facility] office visit/ [name]   Chief Complaint  Patient presents with  . Pulmonary Consult    Self referral. Pt c/o SOB for "many years". He gets winded walking short distances such as from lobby to exam room today.   doe x 20 years improves with saba gradually worse x  Now 50 ft and using saba in multiple forms day >> noct Also anemia last tx early Sept since RT   For prostate ca Sleeps ok flat / some mild leg swelling/ hb but no cough  rec Plan A = Automatic = Symbicort 160 Take 2 puffs first thing in am and then another 2 puffs about 12 hours later.  Work on inhaler technique:   Plan B = Backup Only use your albuterol as a rescue medication Plan C = Crisis - only use your ipatropium- albuterol nebulizer if you first try Plan B and it fails to help > ok to use the nebulizer up to every 4 hours but if start needing it regularly call for immediate appointment      08/26/2017  f/u ov/[name] re:  Moderate asthma vs GOLD II copd  Chief Complaint  Patient presents with  . Follow-up    PFT's done today. His breathing has improved some.    really Not limited by breathing from desired activities on symbicort 160  2bid /rare need for saba hfa only, no neb  rec Work on inhaler technique:      11/29/2017  f/u ov/[name] re:   Mod asthma vs GOLD II copd miant symbicort Chief Complaint  Patient presents with  . Follow-up    Breathing is unchanged. He has a rescue inhaler but has not had to use it.    Dyspnea:  50 ft to dining room / also anemia but not much difference p tx  Cough:  no Sleep: sleeps on side horizontally ok s 02  SABA use:  None rec Try Bevespi Take 2 puffs first thing in am and then another 2 puffs about 12 hours later - fill the prescription if you feel it helps your breathing  Call for appt when return from [place] from the summer      07/25/2018  f/u ov/[name] re:  GOLD II copd vs ACOS   On bevespi 2 bid Chief Complaint  Patient presents with  . Follow-up    Pt states has had several hospitalizations for this breathing since the last appt. He was started on o2 since the last visit. He states he gets winded walking short distances such as from lobby to exam room today. He is using his albuterol inhaler 2 x per wk on average.   Dyspnea:  X 50 ft  Cough: none Sleeping: flat SABA use: as above  02: 24/7  3lpm with activity/ 2lpm at rest      No obvious day to day or daytime variability or assoc excess/ purulent sputum or mucus plugs or hemoptysis or cp or chest tightness, subjective wheeze or overt sinus or hb  symptoms.   Sleeping as above without nocturnal  or early am exacerbation  of respiratory  c/o's or need for noct saba. Also denies any obvious fluctuation of symptoms with weather or environmental changes or other aggravating or alleviating factors except as outlined above   No unusual exposure hx or h/o childhood pna/ asthma or knowledge of premature birth.  Current Allergies, Complete Past Medical History, Past Surgical History, Family History, and Social History were reviewed in [employer] record.  ROS  The following are not active complaints unless bolded Hoarseness, sore throat, dysphagia, dental problems, itching, sneezing,  nasal congestion or discharge of excess mucus or purulent secretions, ear ache,   fever, chills, sweats, unintended wt loss or wt gain, classically pleuritic or exertional cp,  orthopnea pnd or arm/hand swelling  or leg swelling, presyncope, palpitations, abdominal pain, anorexia, nausea,  vomiting, diarrhea  or change in bowel habits or change in bladder habits, change in stools or change in urine, dysuria, hematuria,  rash, arthralgias, visual complaints, headache, numbness, weakness or ataxia or problems with walking or coordination,  change in mood or  memory.        Current Meds  Medication Sig  . acyclovir (ZOVIRAX) 400 MG tablet Take 400 mg 5 (five) times daily by mouth.  Marland Kitchen albuterol (PROAIR HFA) 108 (90 Base) MCG/ACT inhaler Inhale 2 puffs into the lungs every 6 (six) hours as needed for wheezing or shortness of breath.  Marland Kitchen atorvastatin (LIPITOR) 80 MG tablet Take 80 mg by mouth every morning. Take one daily for cholesterol  . calcium citrate (CALCITRATE - DOSED IN MG ELEMENTAL CALCIUM) 950 MG tablet Take 200 mg of elemental calcium daily by mouth.  . Darbepoetin Alfa-Polysorbate (ARANESP, ALB FREE, SURECLICK IJ) Inject as directed.  . fexofenadine (ALLEGRA) 60 MG tablet Take 60 mg by mouth daily.  . furosemide (LASIX) 20 MG tablet Take 20 mg by mouth daily.  . Glycopyrrolate-Formoterol (BEVESPI AEROSPHERE) 9-4.8 MCG/ACT AERO Inhale 2 puffs into the lungs 2 (two) times daily.  Marland Kitchen levothyroxine (SYNTHROID, LEVOTHROID) 25 MCG tablet TAKE ONE TABLET BY MOUTH ONCE DAILY BEFORE BREAKFAST  . metoprolol succinate (TOPROL-XL) 50 MG 24 hr tablet Take 50 mg by mouth every morning. Take one tablet daily for blood pressure  . Multiple Vitamins-Minerals (CENTRUM SILVER ADULT 50+) TABS Take 1 tablet by mouth every morning.  Marland Kitchen omeprazole (PRILOSEC) 40 MG capsule Take 40 mg by mouth daily.  . tamsulosin (FLOMAX) 0.4 MG CAPS capsule Take 0.4 mg by mouth daily after breakfast. Reported on 11/14/2015                     Objective:   Physical Exam    amb obese wm with rollator due to balance   07/25/2018        223 11/29/2017        250  08/26/2017        252   07/01/17 251 lb 9.6 oz (114.1 kg)  12/25/16 248 lb 11.2 oz (112.8 kg)  12/21/16 249 lb 4.8 oz (113.1 kg)     Vital  signs reviewed - Note on arrival 02 sats  91% on 3lpm pulsed   HEENT: nl dentition / oropharynx. Nl external ear canals without cough reflex -  Mild bilateral non-specific turbinate edema     NECK :  without JVD/Nodes/TM/ nl carotid upstrokes bilaterally   LUNGS: no acc muscle use,  Mod barrel  contour chest wall with bilateral  Distant bs  s audible wheeze and  without cough on insp or exp maneuver and mod   Hyperresonant  to  percussion bilaterally     CV:  RRR  no s3 or murmur or increase in P2, and no edema   ABD:  soft and nontender  . No bruits or organomegaly appreciated, bowel sounds nl  MS:   Nl gait/  ext warm without deformities, calf tenderness, cyanosis or clubbing No obvious joint restrictions   SKIN: warm and dry without lesions    NEURO:  alert, approp, nl sensorium with  no motor or cerebellar deficits apparent.              Assessment:

## 2018-07-26 ENCOUNTER — Encounter: Payer: Self-pay | Admitting: Internal Medicine

## 2018-07-26 DIAGNOSIS — Z9981 Dependence on supplemental oxygen: Secondary | ICD-10-CM

## 2018-07-26 DIAGNOSIS — J9611 Chronic respiratory failure with hypoxia: Secondary | ICD-10-CM | POA: Insufficient documentation

## 2018-07-26 NOTE — Assessment & Plan Note (Signed)
Placed on 02 24/7 in [place] summer of 2019    Advised on goal to keep sats > 90% esp with activity

## 2018-07-26 NOTE — Assessment & Plan Note (Signed)
PFT's  1 /20/15  FEV1 1.52 (59 % ) ratio 67  p 8 % improvement from saba p ? prior to study with DLCO  38 % corrects to 56  % for alv volume  But not hgb Spirometry 07/01/2017  FEV1  1.46 (44%)  Ratio 60 p 22% from saba and classic f/v contours  - 07/01/2017  After extensive coaching HFA effectiveness =    75% from a baseline of 25% > try symb 80 2bid   - 08/26/2017  After extensive coaching HFA effectiveness =    75% from a baseline of 50%  - PFT's  08/26/2017  FEV1 1.80 (74 % ) ratio 66  p 15 % improvement from saba p nothing prior to study with DLCO  34/39c % corrects to 54  % for alv volume  - 11/29/2017  After extensive coaching inhaler device  effectiveness =    90% try bevespi 2bid  - Spirometry 07/25/2018  FEV1 1.4 (61%)  Ratio 78 though f/v curves are not physiologic    Not really clear to me that he was hospitalized due to aecopd but if so would have a very low threshold  to change to symb/spiriva or to trelegy as this would be more c/w Group D pattern.  Plans to see Dr Joneen Caraway 07/26/2018 and discuss overall health - fine with me to refer to [facility] rehab if not able to meet his needs at [facility].  Each maintenance medication was reviewed in detail including most importantly the difference between maintenance and as needed and under what circumstances the prns are to be used.  Please see AVS for specific  Instructions which are unique to this visit and I personally typed out  which were reviewed in detail in writing with the patient and a copy provided.

## 2018-07-27 ENCOUNTER — Other Ambulatory Visit: Payer: Self-pay | Admitting: Oncology

## 2018-07-27 ENCOUNTER — Telehealth: Payer: Self-pay | Admitting: Oncology

## 2018-07-27 ENCOUNTER — Encounter: Payer: Self-pay | Admitting: Internal Medicine

## 2018-07-27 ENCOUNTER — Encounter: Payer: Self-pay | Admitting: *Deleted

## 2018-07-27 ENCOUNTER — Non-Acute Institutional Stay: Payer: Medicare Other | Admitting: Internal Medicine

## 2018-07-27 ENCOUNTER — Telehealth: Payer: Self-pay | Admitting: *Deleted

## 2018-07-27 VITALS — BP 132/70 | HR 81 | Temp 98.4°F | Ht 67.0 in | Wt 225.0 lb

## 2018-07-27 DIAGNOSIS — N183 Chronic kidney disease, stage 3 unspecified: Secondary | ICD-10-CM

## 2018-07-27 DIAGNOSIS — J4489 Other specified chronic obstructive pulmonary disease: Secondary | ICD-10-CM

## 2018-07-27 DIAGNOSIS — D631 Anemia in chronic kidney disease: Secondary | ICD-10-CM

## 2018-07-27 DIAGNOSIS — J449 Chronic obstructive pulmonary disease, unspecified: Secondary | ICD-10-CM

## 2018-07-27 DIAGNOSIS — J9611 Chronic respiratory failure with hypoxia: Secondary | ICD-10-CM

## 2018-07-27 DIAGNOSIS — D500 Iron deficiency anemia secondary to blood loss (chronic): Secondary | ICD-10-CM | POA: Diagnosis not present

## 2018-07-27 DIAGNOSIS — Z9981 Dependence on supplemental oxygen: Secondary | ICD-10-CM | POA: Diagnosis not present

## 2018-07-27 DIAGNOSIS — C61 Malignant neoplasm of prostate: Secondary | ICD-10-CM

## 2018-07-27 DIAGNOSIS — I872 Venous insufficiency (chronic) (peripheral): Secondary | ICD-10-CM | POA: Diagnosis not present

## 2018-07-27 DIAGNOSIS — I10 Essential (primary) hypertension: Secondary | ICD-10-CM | POA: Diagnosis not present

## 2018-07-27 DIAGNOSIS — I509 Heart failure, unspecified: Secondary | ICD-10-CM | POA: Diagnosis not present

## 2018-07-27 DIAGNOSIS — K5520 Angiodysplasia of colon without hemorrhage: Secondary | ICD-10-CM | POA: Diagnosis not present

## 2018-07-27 MED ORDER — FUROSEMIDE 20 MG PO TABS: 20.0000 mg | ORAL_TABLET | Freq: Every day | ORAL | 3 refills | Status: AC

## 2018-07-27 NOTE — Telephone Encounter (Signed)
Patient left voicemail message regarding question about an injection needed.  Called and let Dixie, RN, know and she will call patient back.

## 2018-07-27 NOTE — Telephone Encounter (Signed)
Scheduled appt per 1/16 sch message - pt is aware of appt date and time   

## 2018-07-27 NOTE — Progress Notes (Signed)
Location:  Occupational psychologist of Service:  Clinic (12)  Provider: Jalilah Wiltsie L. Mariea Clonts, D.O., C.M.D.  Goals of Care:  Advanced Directives 08/11/2017  Does Patient Have a Medical Advance Directive? Yes  Type of Advance Directive [place]  Does patient want to make changes to medical advance directive? No - Patient declined  Copy of [place] in Chart? Yes  Would patient like information on creating a medical advance directive? -   Chief Complaint  Patient presents with  . Medical Management of Chronic Issues    13mth follow-up    HPI: Patient is a 84 y.o. male with h/o COPD, CAD with prior MI in 2000 requiring stent to RCA and circumflex, carotid disease, hyperlipidemia, obesity, CKD3, RA, hypothyroidism, prostate ca s/p XRT, persistent afib and significant iron deficiency and chronic disease anemia seen today for medical management of chronic diseases.  He and his wife always travel to [place] in the summer.  He is back from [place] as of last week.  End of April went to [place].  Then went to [place] for a grandson's graduation and felt bad when there.  Needed oxygen on the flight back to [place]--doctor on board recommended, pt then went home and couldn't breathe, was hospitalized.  Was again told he had underlying copd and that it was exacerbated by rhinovirus.  Got home health therapy but was quite weak afterwards.  Then had rectal bleeding.  Had endoscopy.  Put a clip on the bleeding site.   Got worse with his breathing again.    He's now getting "shots every 2 wks"--darbopoietin--instead of every 4 wks for his hgb to keep it over 10.  Got good enough eventually with home resp therapy to go to resp therapy at the hospital there.  He has home oxygen now.  Advance home care would not recognize outside of state doctors' orders so he got the oxygen from another company that he's not recalling the name of at present.    He is now seeing "a  gastro guy" also.  Had prior cscope there.  He had a special procedure in [place] to look at his small bowel--small bowel enteroscopy.  Had bleed there--had arteriovenous malformations.  He'd been on eliquis--moved back down to baby asa, and now off of it also.  Cardiology also agreed with this plan while he was in [place].    The smallest bump, he bleeds now.  Had two wounds on his legs and went to the wound care center there.  They recommended compression stockings.  He is on lasix also.  He urinates a lot.  He takes it early in the morning and takes the rest of his meds after bathing and shaving.  They have misplaced his bottle of lasix.  [facility] is willing to get him more but the insurance is not.  Discussed that it's old and cheap so he can probably buy a supply until he is due again.  He thinks it's been 3 days since he took any. He's more sob with DOE and increased edema.  Per the notes from pulmonary in [facility], [place], [place], Dr. Amalia Hailey:  Pt O2 dependent as of 05/11/18 using O2 at 3L with activity and 2L with rest.  Looks like last PFTs were from 08/26/17 there:  FEV1 1.80 (74 % ) ratio 66 p 15 % improvement from saba p nothing prior to study with DLCO 34/39c % corrects to 54 % for alv volume  -  11/29/2017 After extensive coaching inhaler device effectiveness = 90% try bevespi 2bid    He used to be able to get his walker out of the trunk.  He does not have much strength, much appetite and stays cold all the time.  Legs get swollen.  can't wear shoes due to swelling of one foot.   His wife is having to ask the waiter to get him into the restaurant.  Harder to walk from the parking lot to the dining room here.  They live in a [place] but it's a project to get around.  Collie Siad is needing to help him get things in the house and with his compression hose, etc.  They do have cleaning assistance. They are needing more help with security for transportation here.  He has lost weight from 253 in March to 225 lbs today.  Dr. Melvyn Novas  changed his inhaler yesterday--he's on bevespi scheduled and albuterol prn.  Sees him again end of month.    Needs to see Dr. Ross Ludwig not until end of Nov and needs another shot, but every 2 weeks now.  Could not get home in time for the 10/31 appt that was planned.  Wants to get respiratory therapy here in [place].  Sitting down, lying down, he's short of breath.  He needs seated exercise equipment.  [facility] has PT, OT, ST, but does not have a formal pulmonary rehab program.  Last hgb was 8.7 10/17.  He reports he has some bleeding on toilet paper, but has not seen it mixed in stool or black stool.  This was after some constipation with hard stool by his report.    He did see his urologist--he is cancer free and his PSA is low. He reports his PSA is like 0.1 or 0.2 per Mr. Girgenti himself.    They have another great granddaughter.  They did have some lovely times with her and family while in [place] when he was not so ill.    Immunizations documented in [place] as follows:   Immunization History  Administered Date(s) Administered  . Influenza High Dose 07/01/2017  . Pneumococcal Conjugate 04/18/2010  . Pneumococcal Polysaccharide 08/11/2017  . Tdap 03/21/2015  . Zoster (Zostavax) 04/18/2006   Had flu shot this year 07/07/18.  Has not had shingrix vaccines but otherwise up to date.  Past Medical History:  Diagnosis Date  . At risk for sleep apnea    STOP-BANG=5      SENT TO PCP 09-25-2014  . Bilateral lower extremity edema   . COPD mixed type [facility]) pulmologist-  dr Reginia Naas [facility])  note in epic under Care Everywhere tab)   per PFTs: 10/10/2013: Evidence of moderate obstructive lung disease with small airway involvement (asthma); FEV1 54% predicted, FEV1/FVC <70%, FEF25-75% is reduced. Mild response to bronchodilator; FEV1 w/ 8% change after bronchodilator.    -- and  mild intermittant asthma  . Coronary atherosclerosis of native coronary artery    cardiologist-  dr  schwengel  in [place], [place] (where pt lives during spring and summer)  . Eczema   . Elevated prostate specific antigen (PSA) 10/10/2006  . First degree heart block   . Frequency of urination   . GERD (gastroesophageal reflux disease)   . Herpes simplex of eye    bilateral eye-  takes acyclovir daily  . History of kidney stones   . History of MI (myocardial infarction)    2001--  s/p  PCI and stent  . Hyperlipidemia   .  Hypertension   . Hypothyroidism   . Mild intermittent asthma   . Nephrolithiasis    right  . OA (osteoarthritis)   . Prostate cancer ([place])   . Rheumatoid arthritis ([place]) 05/2013  . Right ureteral stone   . Urgency of urination   . Wears glasses   . Wears hearing aid    bilateral    Past Surgical History:  Procedure Laterality Date  . CATARACT EXTRACTION W/ INTRAOCULAR LENS  IMPLANT, BILATERAL Bilateral 2014  . COLONOSCOPY  2010  . CORONARY ANGIOPLASTY WITH STENT PLACEMENT  2001   ([address], [place])   PCI and stenting  . CYSTOSCOPY WITH RETROGRADE PYELOGRAM, URETEROSCOPY AND STENT PLACEMENT Right 09/30/2015   Procedure: RIGHT URETEROSCOPY, RETROGRADE PYELOGRAM, LASER LITHOTRIPSY AND STENT PLACEMENT;  Surgeon: Kathie Rhodes, MD;  Location: [facility];  Service: Urology;  Laterality: Right;  . CYSTOSCOPY/URETEROSCOPY/HOLMIUM LASER/STENT PLACEMENT Right 11/04/2015   Procedure: RIGHT URETEROSCOPY/RETROGRADE PYELOGRAM/HOLMIUM LASER LITHOTRIPSY/STENT PLACEMENT;  Surgeon: Kathie Rhodes, MD;  Location: [facility];  Service: Urology;  Laterality: Right;  . PILONIDAL CYST EXCISION  2006  . TONSILLECTOMY  as child  . TOTAL HIP ARTHROPLASTY Left 2001    Allergies  Allergen Reactions  . Ketoconazole Other (See Comments)    Adverse skin reaction-- legs turned red and purple    Outpatient Encounter Medications as of 07/27/2018  Medication Sig  . acyclovir (ZOVIRAX) 400 MG tablet Take 400 mg 5 (five) times daily by mouth.  Marland Kitchen albuterol (PROAIR HFA) 108  (90 Base) MCG/ACT inhaler Inhale 2 puffs into the lungs every 6 (six) hours as needed for wheezing or shortness of breath.  Marland Kitchen atorvastatin (LIPITOR) 80 MG tablet Take 80 mg by mouth every morning. Take one daily for cholesterol  . calcium citrate (CALCITRATE - DOSED IN MG ELEMENTAL CALCIUM) 950 MG tablet Take 200 mg of elemental calcium daily by mouth.  . Darbepoetin Alfa 300 MCG/ML SOLN Inject into the skin.  . fexofenadine (ALLEGRA) 60 MG tablet Take 60 mg by mouth daily.  . furosemide (LASIX) 20 MG tablet Take 20 mg by mouth daily.  . Glycopyrrolate-Formoterol (BEVESPI AEROSPHERE) 9-4.8 MCG/ACT AERO Inhale 2 puffs into the lungs 2 (two) times daily.  Marland Kitchen levothyroxine (SYNTHROID, LEVOTHROID) 25 MCG tablet TAKE ONE TABLET BY MOUTH ONCE DAILY BEFORE BREAKFAST  . metoprolol succinate (TOPROL-XL) 50 MG 24 hr tablet Take 50 mg by mouth every morning. Take one tablet daily for blood pressure  . Multiple Vitamins-Minerals (CENTRUM SILVER ADULT 50+) TABS Take 1 tablet by mouth every morning.  Marland Kitchen omeprazole (PRILOSEC) 40 MG capsule Take 40 mg by mouth daily.  . tamsulosin (FLOMAX) 0.4 MG CAPS capsule Take 0.4 mg by mouth daily after breakfast. Reported on 11/14/2015  . [DISCONTINUED] Darbepoetin Alfa-Polysorbate (ARANESP, ALB FREE, SURECLICK IJ) Inject as directed.   No facility-administered encounter medications on file as of 07/27/2018.     Review of Systems:  Review of Systems  Constitutional: Negative for chills and fever.  HENT: Negative for congestion.   Eyes: Negative for blurred vision.  Respiratory: Positive for cough and shortness of breath. Negative for sputum production and wheezing.   Cardiovascular: Positive for orthopnea, leg swelling and PND. Negative for chest pain and palpitations.  Gastrointestinal: Positive for constipation. Negative for abdominal pain, blood in stool, diarrhea and melena.       Blood on toilet paper  Genitourinary: Positive for urgency. Negative for dysuria.        Incontinence at times  Musculoskeletal: Positive for  back pain and joint pain. Negative for falls.  Skin:       Thin skin, easily bleeding, injuring himself, laceration on hand  Neurological: Positive for weakness. Negative for dizziness and loss of consciousness.  Endo/Heme/Allergies: Bruises/bleeds easily.  Psychiatric/Behavioral: Negative for depression and memory loss. The patient is not nervous/anxious and does not have insomnia.     Health Maintenance  Topic Date Due  . TETANUS/TDAP  03/20/2025  . INFLUENZA VACCINE  Completed  . PNA vac Low Risk Adult  Completed    Physical Exam: Vitals:   07/27/18 0940  BP: 132/70  Pulse: 81  Temp: 98.4 F (36.9 C)  TempSrc: Oral  SpO2: 94%  Weight: 225 lb (102.1 kg)  Height: 5\' 7"  (1.702 m)   Body mass index is 35.24 kg/m. Physical Exam  Constitutional:  Dyspneic on exertion when walking back from waiting room to clinic at [facility] despite portable O2 at [place]:  Head: Normocephalic and atraumatic.  Cardiovascular:  irreg irreg  Pulmonary/Chest: He has no wheezes. He has rales.  At bases  Abdominal: Soft. Bowel sounds are normal. He exhibits no distension. There is no tenderness.  Musculoskeletal:  Walks with rollator walker, challenging to get up out of chair, gets oxygen twisted around legs  Neurological: He is alert.  Skin: Skin is warm and dry.  Right hand laceration with dressing over it  Psychiatric: He has a normal mood and affect.    Labs reviewed: Basic Metabolic Panel: Recent Labs    12/21/17  NA 148*  K 4.7  BUN 20  CREATININE 1.8*   Liver Function Tests: Recent Labs    12/21/17  AST 22  ALT 22  ALKPHOS 101   No results for input(s): LIPASE, AMYLASE in the last 8760 hours. No results for input(s): AMMONIA in the last 8760 hours. CBC: Recent Labs    11/30/17 1042 12/21/17 1056 01/12/18 1134  WBC 9.6 8.7 8.9  NEUTROABS 7.9* 7.1* 7.5*  HGB 8.5* 8.2* 6.8*  HCT 27.8* 26.0* 21.6*  MCV 97.2 89.7  83.1  PLT 285 385 300   Lipid Panel: Recent Labs    12/21/17  CHOL 152  HDL 61  LDLCALC 78  TRIG 66   Lab Results  Component Value Date   HGBA1C 5.2 11/19/2016    Assessment/Plan 1. Chronic congestive heart failure, unspecified heart failure type ([place]) -new diagnosis since last year--seems to be diastolic as EF hyperdynamic -continue lasix--30 day supply sent in for him since he misplaced the bottle- furosemide (LASIX) 20 MG tablet; Take 1 tablet (20 mg total) by mouth daily.  Dispense: 30 tablet; Refill: 3  2. Chronic obstructive airway disease with asthma ([place]) - much worse since his hospitalizations in [place] - cannot perform self-care w/o dyspnea, now with chronic resp failure with hypoxia on 2-3L O2 - Pulmonary rehab therapeutic exercise; Future  Order placed -data added above and he actually had already qualified and started his rehab in [place], [place] -discussed overall declining function and need for change in level of care from [place] (independent living home) at [facility] to at least an apt in the main building or even AL at this point, but pt's wife is very much opposed and negative about this -pt wants to try pulm rehab here first before considering any further therapies/interventions at [place] -encouraged them to get more assistance from security and discussed pt to be added to alert list here so [facility] staff aware of changes from the past several months--clinic  nurse to reach out to patient ; also suggested he speak with [facility] social work to "plan the future"  3. Anemia in stage 3 chronic kidney disease ([place]) -f/u with Dr. Sherley Bounds was sent and he was getting him in asap  4. AVM (arteriovenous malformation) of small bowel, acquired -s/p clipping and ablations during his hospitalizations in [place] over the summer -he's seen no recent melena -received iron and transfusions there and continued his darbopoietin at increased frequency (biweekly)  5. Essential hypertension -bp  is controlled with current regimen, cont same   6. Iron deficiency anemia due to chronic blood loss -F/u Dr. Alen Blew asap--needs labs and shot--last was 10/17 so already overdue.  -turned out he'd missed his 10/31 appt as could not get back to [place] on time  7. Malignant neoplasm of prostate ([place]) -last PSA in care everywhere was 02/17/17  <0.1 -getting more incontinent now which is annoying to him, using adult briefs  8. Venous stasis dermatitis of both lower extremities -is now actually agreeable to the compression hose -had two wounds on his legs that healed with wound care assistance  9. Chronic respiratory failure with hypoxia, on home O2 therapy [facility]) Pulmonary rehab referral placed--Dr. Amalia Hailey at [facility] in [place], [place] had initially done testing and referral there -pt had started his rehab and was noting significant benefits and would like to continue here in [place] with hopes to maintain his independence as long as possible  Labs/tests ordered:  cmp and tsh with labs to be done at cancer center as pt's wife didn't want to transport him to [facility] for labs  Next appt:  08/24/2018 f/u in [place]. Maryiah Olvey, D.O. [facility] [address], [place] [address] Cell Phone (Mon-Fri 8am-5pm):  [phone] On Call:  [phone] & follow prompts after 5pm & weekends Office Phone:  [phone] Office Fax:  [phone]

## 2018-07-27 NOTE — Telephone Encounter (Signed)
No answer. LM for patient to call me.

## 2018-07-28 ENCOUNTER — Inpatient Hospital Stay: Payer: Medicare Other | Attending: Oncology

## 2018-07-28 ENCOUNTER — Telehealth: Payer: Self-pay | Admitting: *Deleted

## 2018-07-28 ENCOUNTER — Telehealth: Payer: Self-pay

## 2018-07-28 ENCOUNTER — Inpatient Hospital Stay: Payer: Medicare Other

## 2018-07-28 ENCOUNTER — Inpatient Hospital Stay (HOSPITAL_BASED_OUTPATIENT_CLINIC_OR_DEPARTMENT_OTHER): Payer: Medicare Other | Admitting: Oncology

## 2018-07-28 DIAGNOSIS — D509 Iron deficiency anemia, unspecified: Secondary | ICD-10-CM | POA: Diagnosis not present

## 2018-07-28 DIAGNOSIS — N183 Chronic kidney disease, stage 3 unspecified: Secondary | ICD-10-CM

## 2018-07-28 DIAGNOSIS — D649 Anemia, unspecified: Secondary | ICD-10-CM

## 2018-07-28 DIAGNOSIS — D631 Anemia in chronic kidney disease: Secondary | ICD-10-CM

## 2018-07-28 DIAGNOSIS — C61 Malignant neoplasm of prostate: Secondary | ICD-10-CM | POA: Insufficient documentation

## 2018-07-28 DIAGNOSIS — N189 Chronic kidney disease, unspecified: Secondary | ICD-10-CM | POA: Diagnosis not present

## 2018-07-28 DIAGNOSIS — Z9981 Dependence on supplemental oxygen: Secondary | ICD-10-CM | POA: Diagnosis not present

## 2018-07-28 DIAGNOSIS — E291 Testicular hypofunction: Secondary | ICD-10-CM

## 2018-07-28 DIAGNOSIS — Z923 Personal history of irradiation: Secondary | ICD-10-CM

## 2018-07-28 DIAGNOSIS — E785 Hyperlipidemia, unspecified: Secondary | ICD-10-CM | POA: Diagnosis not present

## 2018-07-28 DIAGNOSIS — J449 Chronic obstructive pulmonary disease, unspecified: Secondary | ICD-10-CM

## 2018-07-28 DIAGNOSIS — E039 Hypothyroidism, unspecified: Secondary | ICD-10-CM | POA: Diagnosis not present

## 2018-07-28 LAB — CBC WITH DIFFERENTIAL/PLATELET
ABS IMMATURE GRANULOCYTES: 0.04 10*3/uL (ref 0.00–0.07)
BASOS PCT: 0 %
Basophils Absolute: 0 10*3/uL (ref 0.0–0.1)
EOS ABS: 0.1 10*3/uL (ref 0.0–0.5)
Eosinophils Relative: 1 %
HCT: 30.3 % — ABNORMAL LOW (ref 39.0–52.0)
Hemoglobin: 8.5 g/dL — ABNORMAL LOW (ref 13.0–17.0)
Immature Granulocytes: 1 %
LYMPHS PCT: 4 %
Lymphs Abs: 0.3 10*3/uL — ABNORMAL LOW (ref 0.7–4.0)
MCH: 24.6 pg — AB (ref 26.0–34.0)
MCHC: 28.1 g/dL — ABNORMAL LOW (ref 30.0–36.0)
MCV: 87.6 fL (ref 80.0–100.0)
MONO ABS: 0.8 10*3/uL (ref 0.1–1.0)
Monocytes Relative: 9 %
Neutro Abs: 7.7 10*3/uL (ref 1.7–7.7)
Neutrophils Relative %: 85 %
PLATELETS: 344 10*3/uL (ref 150–400)
RBC: 3.46 MIL/uL — ABNORMAL LOW (ref 4.22–5.81)
RDW: 20.7 % — AB (ref 11.5–15.5)
WBC: 8.9 10*3/uL (ref 4.0–10.5)
nRBC: 0 % (ref 0.0–0.2)

## 2018-07-28 LAB — LIPID PANEL
Cholesterol: 140 (ref 0–200)
HDL: 69 (ref 35–70)
LDL Cholesterol: 56
TRIGLYCERIDES: 77 (ref 40–160)

## 2018-07-28 LAB — HEPATIC FUNCTION PANEL
ALT: 15 (ref 10–40)
AST: 19 (ref 14–40)
Alkaline Phosphatase: 93 (ref 25–125)
BILIRUBIN, TOTAL: 0.4

## 2018-07-28 LAB — SAMPLE TO BLOOD BANK

## 2018-07-28 LAB — IRON AND TIBC
Iron: 34 ug/dL — ABNORMAL LOW (ref 42–163)
Saturation Ratios: 11 % — ABNORMAL LOW (ref 20–55)
TIBC: 319 ug/dL (ref 202–409)
UIBC: 285 ug/dL (ref 117–376)

## 2018-07-28 LAB — BASIC METABOLIC PANEL
BUN: 39 — AB (ref 4–21)
Creatinine: 1.9 — AB (ref 0.6–1.3)
GLUCOSE: 99
POTASSIUM: 4.3 (ref 3.4–5.3)
SODIUM: 147 (ref 137–147)

## 2018-07-28 LAB — TSH: TSH: 5.69 (ref 0.41–5.90)

## 2018-07-28 LAB — FERRITIN: FERRITIN: 161 ng/mL (ref 24–336)

## 2018-07-28 MED ORDER — DARBEPOETIN ALFA 300 MCG/0.6ML IJ SOSY
300.0000 ug | PREFILLED_SYRINGE | Freq: Once | INTRAMUSCULAR | Status: AC
  Administered 2018-07-28: 300 ug via SUBCUTANEOUS

## 2018-07-28 NOTE — Telephone Encounter (Signed)
Terry faxed order to the cancer center

## 2018-07-28 NOTE — Addendum Note (Signed)
Addended by: Wyatt Portela on: 07/28/2018 12:31 PM   Modules accepted: Orders

## 2018-07-28 NOTE — Progress Notes (Signed)
Hematology and Oncology Follow Up Visit  Kevin Kane [id number] April 07, 1930 84 y.o. 07/28/2018 11:18 AM Kevin Kane, DOReed, [name], DO   Principle Diagnosis: 84 year old with the:   1.  Prostate cancer diagnosed in September 2017.  He is found to have T2c disease with PSA of 15.9 and Gleason score 4+5 = 9.   2.  Anemia: Diagnosed in 2018 found to have anemia of renal disease as well as iron deficiency.   Prior Therapy:   He is status post androgen deprivation therapy followed by radiation therapy completed in December 2017. Androgen deprivation therapy to be completed in 2019.  He is status post a bone marrow biopsy in April 2018 which did not show any myelodysplasia.  Current therapy: Aranesp 300 mcg every 2 weeks started in October 2018.  The goal is to keep his hemoglobin above 10.  Interim History:  Kevin Kane returns today for a repeat evaluation.  Since the last visit, he has spent the summer in [place] which he does every year.  He had a lot of health issues during that visit including hospitalization for respiratory failure as well as worsening anemia.  He has received transfusion as well as iron infusion periodically.  He also continues to receive Aranesp every 2 weeks.  He is currently oxygen dependent and ambulates with the help of a walker.  He does report dyspnea on exertion with very little dyspnea at rest.  He denies any recent hematochezia or melena.  His performance status and quality of life is limited but at this time manageable.  He denies any chest pain or palpitation at this time.  He does have chronic lower extremity edema and does wear compression stocking.  He does not report any headaches, blurry vision, syncope or seizures.  He does not report any changes in his mentation or confusion.  He does not report any fevers, chills, sweats or weight loss. He does not report any cough, wheezing or hemoptysis. He does not report any nausea, vomiting or abdominal  pain.  Denies any bowel changes.  He does not report any frequency urgency or hesitancy. He does not report any hematuria or dysuria. He does not report any bone pain or pathological fractures.  He does not report any anxiety or depression.  Remaining review of systems is negative.    Medications: I have reviewed the patient's current medications.  Current Outpatient Medications  Medication Sig Dispense Refill  . acyclovir (ZOVIRAX) 400 MG tablet Take 400 mg 5 (five) times daily by mouth.    Marland Kitchen albuterol (PROAIR HFA) 108 (90 Base) MCG/ACT inhaler Inhale 2 puffs into the lungs every 6 (six) hours as needed for wheezing or shortness of breath.    Marland Kitchen atorvastatin (LIPITOR) 80 MG tablet Take 80 mg by mouth every morning. Take one daily for cholesterol    . calcium citrate (CALCITRATE - DOSED IN MG ELEMENTAL CALCIUM) 950 MG tablet Take 200 mg of elemental calcium daily by mouth.    . Darbepoetin Alfa 300 MCG/ML SOLN Inject into the skin.    . fexofenadine (ALLEGRA) 60 MG tablet Take 60 mg by mouth daily.    . furosemide (LASIX) 20 MG tablet Take 1 tablet (20 mg total) by mouth daily. 30 tablet 3  . Glycopyrrolate-Formoterol (BEVESPI AEROSPHERE) 9-4.8 MCG/ACT AERO Inhale 2 puffs into the lungs 2 (two) times daily. 1 Inhaler 11  . levothyroxine (SYNTHROID, LEVOTHROID) 25 MCG tablet TAKE ONE TABLET BY MOUTH ONCE DAILY BEFORE BREAKFAST 90  tablet 1  . metoprolol succinate (TOPROL-XL) 50 MG 24 hr tablet Take 50 mg by mouth every morning. Take one tablet daily for blood pressure    . Multiple Vitamins-Minerals (CENTRUM SILVER ADULT 50+) TABS Take 1 tablet by mouth every morning.    Marland Kitchen omeprazole (PRILOSEC) 40 MG capsule Take 40 mg by mouth daily.    . tamsulosin (FLOMAX) 0.4 MG CAPS capsule Take 0.4 mg by mouth daily after breakfast. Reported on 11/14/2015     No current facility-administered medications for this visit.      Allergies:  Allergies  Allergen Reactions  . Ketoconazole Other (See Comments)     Adverse skin reaction-- legs turned red and purple    Past Medical History, Surgical history, Social history reviewed without any changes.  He denied any smoking or alcohol abuse.  Review of Systems:  Physical Exam: Blood pressure (!) 137/49, pulse 84, temperature (!) 97.5 F (36.4 C), temperature source Oral, resp. rate 18, height 5' 7"  (1.702 m), weight 225 lb 6.4 oz (102.2 kg), SpO2 94 %.   ECOG: 2   General appearance: Alert, awake without any distress.  Chronically ill-appearing. Head: Atraumatic without abnormalities Oropharynx: Without any thrush or ulcers. Eyes: No scleral icterus. Lymph nodes: No lymphadenopathy noted in the cervical, supraclavicular, or axillary nodes Heart:regular rate and rhythm, without any murmurs or gallops.    Bilateral lower extremity edema noted. Lung: Clear to auscultation without any rhonchi, wheezes or dullness to percussion. Abdomin: Soft, nontender without any shifting dullness or ascites. Musculoskeletal: No clubbing or cyanosis. Neurological: No motor or sensory deficits. Skin: No rashes or lesions.     Lab Results: Lab Results  Component Value Date   WBC 8.9 07/28/2018   HGB 8.5 (Kane) 07/28/2018   HCT 30.3 (Kane) 07/28/2018   MCV 87.6 07/28/2018   PLT 344 07/28/2018     Chemistry      Component Value Date/Time   NA 148 (A) 12/21/2017   NA 146 (H) 11/26/2016 1156   K 4.7 12/21/2017   K 4.1 11/26/2016 1156   CL 108 12/21/2016 0931   CO2 26 12/21/2016 0931   CO2 27 11/26/2016 1156   BUN 20 12/21/2017   BUN 32.3 (H) 11/26/2016 1156   CREATININE 1.8 (A) 12/21/2017   CREATININE 1.56 (H) 12/21/2016 0931   CREATININE 1.8 (H) 11/26/2016 1156   GLU 104 12/21/2017      Component Value Date/Time   CALCIUM 9.4 12/21/2016 0931   CALCIUM 9.7 11/26/2016 1156   ALKPHOS 101 12/21/2017   ALKPHOS 102 11/26/2016 1156   AST 22 12/21/2017   AST 20 11/26/2016 1156   ALT 22 12/21/2017   ALT 19 11/26/2016 1156   BILITOT 0.52 11/26/2016  1156       Impression and Plan:  84 year old Kevin with:  1.  T2c organ confined prostate cancer diagnosed in 2017.  His PSA continues to be undetectable after definitive treatment.  Currently on active surveillance and continues to follow with urology regarding this issue.   2.  Anemia: His anemia is multifactorial in nature with related to renal disease as well as chronic blood loss from AVMs.  His hemoglobin from today is 8.5 and he is minimally symptomatic from that.  He does report dyspnea on exertion but has been chronic and likely related to pulmonary disease.  Risks and benefits of packed red cell transfusion was discussed today and he opted to defer at this time.  I am checking his iron levels today  and we have discussed the role of intravenous iron if needed.  Complication associated with Feraheme were discussed which include arthralgias, myalgias and infusion related complications.  He is agreeable to proceed if his iron studies are indicative of iron deficiency.  I recommend continuing Aranesp at this while at 300 mcg every 2 weeks and will restart it today.    3. Follow-up: Will be every 2 weeks for Aranesp 300 mcg to keep his hemoglobin above 10.  He will have a follow-up in 6 weeks for repeat evaluation.  25 minutes was spent face-to-face today.  More than 50% of the encounter was spent reviewing his disease status, laboratory data, options of treatment and complications related to therapy.  Zola Button, MD 11/7/201911:18 AM

## 2018-07-28 NOTE — Telephone Encounter (Signed)
Printed avs and calender of upcoming appointment per 11/7 los

## 2018-07-28 NOTE — Telephone Encounter (Signed)
Spoke with patient. Per dr Alen Blew, iron is low and he will need iv iron infusion, that is already scheduled.

## 2018-07-28 NOTE — Telephone Encounter (Signed)
Patient called stating he seen Dr.[name] yesterday and she mentioned that he needs to have labs done. Patient has a pending appointment with his oncologist today and will have labs at 10:30 am  Patient would like for Dr.[name] to fax orders to Dr. Hazeline Junker office if she needs something more than what Dr.Shadad ordered.   Please advise

## 2018-07-28 NOTE — Telephone Encounter (Signed)
[facility] called to ask if Dr. Mariea Clonts would like to have any additional labs added to the panel that Dr. Alen Blew drew. He ordered the following"  Iron & TIBC, ferritin, CBC, a sample tube for blood bank, and a spare tube for possible add on labs.   If Dr. Mariea Clonts would like additional labs added, please call their lab at [phone].

## 2018-07-28 NOTE — Telephone Encounter (Signed)
Let's add on a fasting lipid panel and CMP for hyperlipidemia and TSH for hypothyroidism is possible.  I appreciate their willingness to draw these b/c pt's wife didn't want to bring him to get labs done at [facility].

## 2018-07-28 NOTE — Telephone Encounter (Signed)
-----   Message from Wyatt Portela, MD sent at 07/28/2018  1:45 PM EST ----- Please let him know his iron is low and will need IV iron as scheduled.

## 2018-07-29 DIAGNOSIS — I509 Heart failure, unspecified: Secondary | ICD-10-CM | POA: Insufficient documentation

## 2018-07-29 NOTE — Addendum Note (Signed)
Addended by: Hollace Kinnier L on: 07/29/2018 01:29 PM   Modules accepted: Orders

## 2018-07-29 NOTE — Addendum Note (Signed)
Addended by: Logan Bores on: 07/29/2018 01:38 PM   Modules accepted: Orders

## 2018-07-29 NOTE — Addendum Note (Signed)
Addended by: Logan Bores on: 07/29/2018 01:28 PM   Modules accepted: Orders

## 2018-08-04 ENCOUNTER — Telehealth (HOSPITAL_COMMUNITY): Payer: Self-pay

## 2018-08-04 NOTE — Telephone Encounter (Signed)
Called and spoke with pt in regards to PR - scheduled orientation on 08/22/18 at 9:30am. Pt will attend the 1:30pm exc class. Mailed packet.

## 2018-08-04 NOTE — Telephone Encounter (Signed)
Pt's insurance is active through Medicare A/B and AARP - pt is covered at 100%.

## 2018-08-09 ENCOUNTER — Non-Acute Institutional Stay: Payer: Medicare Other

## 2018-08-09 VITALS — BP 140/70 | HR 74 | Temp 98.0°F | Ht 67.0 in | Wt 236.0 lb

## 2018-08-09 DIAGNOSIS — Z0000 Encounter for general adult medical examination without abnormal findings: Secondary | ICD-10-CM | POA: Diagnosis not present

## 2018-08-09 MED ORDER — ZOSTER VAC RECOMB ADJUVANTED 50 MCG/0.5ML IM SUSR: 0.5000 mL | Freq: Once | INTRAMUSCULAR | 1 refills | Status: AC

## 2018-08-09 NOTE — Patient Instructions (Addendum)
[name] , Thank you for taking time to come for your Medicare Wellness Visit. I appreciate your ongoing commitment to your health goals. Please review the following plan we discussed and let me know if I can assist you in the future.   Screening recommendations/referrals: Colonoscopy excluded, over age 82 Recommended yearly ophthalmology/optometry visit for glaucoma screening and checkup Recommended yearly dental visit for hygiene and checkup  Vaccinations: Influenza vaccine up to date Pneumococcal vaccine up to date, completed Tdap vaccine up to date, due 03/20/2025 Shingles vaccine due, ordered to pharmacy    Advanced directives: in chart  Conditions/risks identified:  Respiratory   Next appointment: Kevin Kane 08/24/2018 @ 9:30am  Preventive Care 45 Years and Older, Male Preventive care refers to lifestyle choices and visits with your health care provider that can promote health and wellness. What does preventive care include?  A yearly physical exam. This is also called an annual well check.  Dental exams once or twice a year.  Routine eye exams. Ask your health care provider how often you should have your eyes checked.  Personal lifestyle choices, including:  Daily care of your teeth and gums.  Regular physical activity.  Eating a healthy diet.  Avoiding tobacco and drug use.  Limiting alcohol use.  Practicing safe sex.  Taking low doses of aspirin every day.  Taking vitamin and mineral supplements as recommended by your health care provider. What happens during an annual well check? The services and screenings done by your health care provider during your annual well check will depend on your age, overall health, lifestyle risk factors, and family history of disease. Counseling  Your health care provider may ask you questions about your:  Alcohol use.  Tobacco use.  Drug use.  Emotional well-being.  Home and relationship well-being.  Sexual  activity.  Eating habits.  History of falls.  Memory and ability to understand (cognition).  Work and work Statistician. Screening  You may have the following tests or measurements:  Height, weight, and BMI.  Blood pressure.  Lipid and cholesterol levels. These may be checked every 5 years, or more frequently if you are over 18 years old.  Skin check.  Lung cancer screening. You may have this screening every year starting at age 45 if you have a 30-pack-year history of smoking and currently smoke or have quit within the past 15 years.  Fecal occult blood test (FOBT) of the stool. You may have this test every year starting at age 33.  Flexible sigmoidoscopy or colonoscopy. You may have a sigmoidoscopy every 5 years or a colonoscopy every 10 years starting at age 44.  Prostate cancer screening. Recommendations will vary depending on your family history and other risks.  Hepatitis C blood test.  Hepatitis B blood test.  Sexually transmitted disease (STD) testing.  Diabetes screening. This is done by checking your blood sugar (glucose) after you have not eaten for a while (fasting). You may have this done every 1-3 years.  Abdominal aortic aneurysm (AAA) screening. You may need this if you are a current or former smoker.  Osteoporosis. You may be screened starting at age 37 if you are at high risk. Talk with your health care provider about your test results, treatment options, and if necessary, the need for more tests. Vaccines  Your health care provider may recommend certain vaccines, such as:  Influenza vaccine. This is recommended every year.  Tetanus, diphtheria, and acellular pertussis (Tdap, Td) vaccine. You may need a Td  booster every 10 years.  Zoster vaccine. You may need this after age 55.  Pneumococcal 13-valent conjugate (PCV13) vaccine. One dose is recommended after age 47.  Pneumococcal polysaccharide (PPSV23) vaccine. One dose is recommended after age  39. Talk to your health care provider about which screenings and vaccines you need and how often you need them. This information is not intended to replace advice given to you by your health care provider. Make sure you discuss any questions you have with your health care provider. Document Released: 10/04/2015 Document Revised: 05/27/2016 Document Reviewed: 07/09/2015 Elsevier Interactive Patient Education  2017 [place] Prevention in the Home Falls can cause injuries. They can happen to people of all ages. There are many things you can do to make your home safe and to help prevent falls. What can I do on the outside of my home?  Regularly fix the edges of walkways and driveways and fix any cracks.  Remove anything that might make you trip as you walk through a door, such as a raised step or threshold.  Trim any bushes or trees on the path to your home.  Use bright outdoor lighting.  Clear any walking paths of anything that might make someone trip, such as rocks or tools.  Regularly check to see if handrails are loose or broken. Make sure that both sides of any steps have handrails.  Any raised decks and porches should have guardrails on the edges.  Have any leaves, snow, or ice cleared regularly.  Use sand or salt on walking paths during winter.  Clean up any spills in your garage right away. This includes oil or grease spills. What can I do in the bathroom?  Use night lights.  Install grab bars by the toilet and in the tub and shower. Do not use towel bars as grab bars.  Use non-skid mats or decals in the tub or shower.  If you need to sit down in the shower, use a plastic, non-slip stool.  Keep the floor dry. Clean up any water that spills on the floor as soon as it happens.  Remove soap buildup in the tub or shower regularly.  Attach bath mats securely with double-sided non-slip rug tape.  Do not have throw rugs and other things on the floor that can make  you trip. What can I do in the bedroom?  Use night lights.  Make sure that you have a light by your bed that is easy to reach.  Do not use any sheets or blankets that are too big for your bed. They should not hang down onto the floor.  Have a firm chair that has side arms. You can use this for support while you get dressed.  Do not have throw rugs and other things on the floor that can make you trip. What can I do in the kitchen?  Clean up any spills right away.  Avoid walking on wet floors.  Keep items that you use a lot in easy-to-reach places.  If you need to reach something above you, use a strong step stool that has a grab bar.  Keep electrical cords out of the way.  Do not use floor polish or wax that makes floors slippery. If you must use wax, use non-skid floor wax.  Do not have throw rugs and other things on the floor that can make you trip. What can I do with my stairs?  Do not leave any items on the stairs.  Make sure that there are handrails on both sides of the stairs and use them. Fix handrails that are broken or loose. Make sure that handrails are as long as the stairways.  Check any carpeting to make sure that it is firmly attached to the stairs. Fix any carpet that is loose or worn.  Avoid having throw rugs at the top or bottom of the stairs. If you do have throw rugs, attach them to the floor with carpet tape.  Make sure that you have a light switch at the top of the stairs and the bottom of the stairs. If you do not have them, ask someone to add them for you. What else can I do to help prevent falls?  Wear shoes that:  Do not have high heels.  Have rubber bottoms.  Are comfortable and fit you well.  Are closed at the toe. Do not wear sandals.  If you use a stepladder:  Make sure that it is fully opened. Do not climb a closed stepladder.  Make sure that both sides of the stepladder are locked into place.  Ask someone to hold it for you, if  possible.  Clearly mark and make sure that you can see:  Any grab bars or handrails.  First and last steps.  Where the edge of each step is.  Use tools that help you move around (mobility aids) if they are needed. These include:  Canes.  Walkers.  Scooters.  Crutches.  Turn on the lights when you go into a dark area. Replace any light bulbs as soon as they burn out.  Set up your furniture so you have a clear path. Avoid moving your furniture around.  If any of your floors are uneven, fix them.  If there are any pets around you, be aware of where they are.  Review your medicines with your doctor. Some medicines can make you feel dizzy. This can increase your chance of falling. Ask your doctor what other things that you can do to help prevent falls. This information is not intended to replace advice given to you by your health care provider. Make sure you discuss any questions you have with your health care provider. Document Released: 07/04/2009 Document Revised: 02/13/2016 Document Reviewed: 10/12/2014 Elsevier Interactive Patient Education  2017 [employer].

## 2018-08-09 NOTE — Progress Notes (Signed)
Subjective:   Kevin Kane is a 84 y.o. male who presents for Medicare Annual/Subsequent preventive examination at [facility]  Last AWV-07/27/2017    Objective:    Vitals: BP 140/70 (BP Location: Left Arm, Patient Position: Sitting)   Pulse 74   Temp 98 F (36.7 C) (Oral)   Ht 5\' 7"  (1.702 m)   Wt 236 lb (107 kg)   SpO2 91%   BMI 36.96 kg/m   Body mass index is 36.96 kg/m.  Advanced Directives 08/09/2018 08/11/2017 07/27/2017 12/21/2016 12/01/2016 11/25/2016 11/18/2016  Does Patient Have a Medical Advance Directive? Yes Yes Yes Yes Yes Yes Yes  Type of [place] administrator Power of [place];Living will [place];Living will Healthcare Power of [place];Living will  Does patient want to make changes to medical advance directive? No - Patient declined No - Patient declined No - Patient declined No - Patient declined - - -  Copy of [place] in Chart? No - copy requested Yes Yes No - copy requested No - copy requested Yes -  Would patient like information on creating a medical advance directive? - - - - - - -    Tobacco Social History   Tobacco Use  Smoking Status Former Smoker  . Packs/day: 1.00  . Years: 12.00  . Pack years: 12.00  . Types: Cigarettes  . Last attempt to quit: 09/21/1958  . Years since quitting: 59.9  Smokeless Tobacco Never Used     Counseling given: Not Answered   Clinical Intake:  Pre-visit preparation completed: No  Pain : No/denies pain     Diabetes: No  How often do you need to have someone help you when you read instructions, pamphlets, or other written materials from your doctor or pharmacy?: 1 - Never What is the last grade level you completed in school?: college  Interpreter Needed?: No  Information entered by :: Tyson Dense, RN  Past Medical History:    Diagnosis Date  . At risk for sleep apnea    STOP-BANG=5      SENT TO PCP 09-25-2014  . Bilateral lower extremity edema   . COPD mixed type [facility]) pulmologist-  dr Reginia Naas [facility])  note in epic under Care Everywhere tab)   per PFTs: 10/10/2013: Evidence of moderate obstructive lung disease with small airway involvement (asthma); FEV1 54% predicted, FEV1/FVC <70%, FEF25-75% is reduced. Mild response to bronchodilator; FEV1 w/ 8% change after bronchodilator.    -- and  mild intermittant asthma  . Coronary atherosclerosis of native coronary artery    cardiologist-  dr schwengel  in [place], [place] (where pt lives during spring and summer)  . Eczema   . Elevated prostate specific antigen (PSA) 10/10/2006  . First degree heart block   . Frequency of urination   . GERD (gastroesophageal reflux disease)   . Herpes simplex of eye    bilateral eye-  takes acyclovir daily  . History of kidney stones   . History of MI (myocardial infarction)    2001--  s/p  PCI and stent  . Hyperlipidemia   . Hypertension   . Hypothyroidism   . Mild intermittent asthma   . Nephrolithiasis    right  . OA (osteoarthritis)   . Prostate cancer ([place])   . Rheumatoid arthritis ([place]) 05/2013  . Right ureteral stone   . Urgency of urination   .  Wears glasses   . Wears hearing aid    bilateral   Past Surgical History:  Procedure Laterality Date  . CATARACT EXTRACTION W/ INTRAOCULAR LENS  IMPLANT, BILATERAL Bilateral 2014  . COLONOSCOPY  2010  . CORONARY ANGIOPLASTY WITH STENT PLACEMENT  2001   ([address], [place])   PCI and stenting  . CYSTOSCOPY WITH RETROGRADE PYELOGRAM, URETEROSCOPY AND STENT PLACEMENT Right 09/30/2015   Procedure: RIGHT URETEROSCOPY, RETROGRADE PYELOGRAM, LASER LITHOTRIPSY AND STENT PLACEMENT;  Surgeon: Kathie Rhodes, MD;  Location: [facility];  Service: Urology;  Laterality: Right;  . CYSTOSCOPY/URETEROSCOPY/HOLMIUM LASER/STENT PLACEMENT Right 11/04/2015   Procedure: RIGHT  URETEROSCOPY/RETROGRADE PYELOGRAM/HOLMIUM LASER LITHOTRIPSY/STENT PLACEMENT;  Surgeon: Kathie Rhodes, MD;  Location: [facility];  Service: Urology;  Laterality: Right;  . PILONIDAL CYST EXCISION  2006  . TONSILLECTOMY  as child  . TOTAL HIP ARTHROPLASTY Left 2001   Family History  Problem Relation Age of Onset  . Stroke Mother   . Heart disease Father   . Cancer Neg Hx    Social History   Socioeconomic History  . Marital status: Married    Spouse name: Not on file  . Number of children: Not on file  . Years of education: Not on file  . Highest education level: Not on file  Occupational History  . Occupation: retired Product manager  . Financial resource strain: Not hard at all  . Food insecurity:    Worry: Never true    Inability: Never true  . Transportation needs:    Medical: No    Non-medical: No  Tobacco Use  . Smoking status: Former Smoker    Packs/day: 1.00    Years: 12.00    Pack years: 12.00    Types: Cigarettes    Last attempt to quit: 09/21/1958    Years since quitting: 59.9  . Smokeless tobacco: Never Used  Substance and Sexual Activity  . Alcohol use: Yes    Alcohol/week: 1.0 standard drinks    Types: 1 Glasses of wine per week  . Drug use: No  . Sexual activity: Not Currently  Lifestyle  . Physical activity:    Days per week: 0 days    Minutes per session: 0 min  . Stress: To some extent  Relationships  . Social connections:    Talks on phone: More than three times a week    Gets together: More than three times a week    Attends religious service: More than 4 times per year    Active member of club or organization: Yes    Attends meetings of clubs or organizations: 1 to 4 times per year    Relationship status: Married  Other Topics Concern  . Not on file  Social History Narrative   Patient is Married since 1955. Retired Programme researcher, broadcasting/film/video. Lives in single level home, Independent Living section at [facility]  since 2013.  Spends half the year at home in [place].    Stopped smoking 1964, Moderate alcohol intake, 5 glasses wine/ week    Minimal exercise, walking. Has pet cat.   Patient has no Advanced planning documents          Outpatient Encounter Medications as of 08/09/2018  Medication Sig  . acyclovir (ZOVIRAX) 400 MG tablet Take 400 mg 5 (five) times daily by mouth.  Marland Kitchen albuterol (PROAIR HFA) 108 (90 Base) MCG/ACT inhaler Inhale 2 puffs into the lungs every 6 (six) hours as needed for wheezing or shortness  of breath.  Marland Kitchen atorvastatin (LIPITOR) 80 MG tablet Take 80 mg by mouth every morning. Take one daily for cholesterol  . calcium citrate (CALCITRATE - DOSED IN MG ELEMENTAL CALCIUM) 950 MG tablet Take 200 mg of elemental calcium daily by mouth.  . Darbepoetin Alfa 300 MCG/ML SOLN Inject into the skin.  . fexofenadine (ALLEGRA) 60 MG tablet Take 60 mg by mouth daily.  . furosemide (LASIX) 20 MG tablet Take 1 tablet (20 mg total) by mouth daily.  . Glycopyrrolate-Formoterol (BEVESPI AEROSPHERE) 9-4.8 MCG/ACT AERO Inhale 2 puffs into the lungs 2 (two) times daily.  Marland Kitchen levothyroxine (SYNTHROID, LEVOTHROID) 25 MCG tablet TAKE ONE TABLET BY MOUTH ONCE DAILY BEFORE BREAKFAST  . metoprolol succinate (TOPROL-XL) 50 MG 24 hr tablet Take 50 mg by mouth every morning. Take one tablet daily for blood pressure  . Multiple Vitamins-Minerals (CENTRUM SILVER ADULT 50+) TABS Take 1 tablet by mouth every morning.  Marland Kitchen omeprazole (PRILOSEC) 40 MG capsule Take 40 mg by mouth daily.  . tamsulosin (FLOMAX) 0.4 MG CAPS capsule Take 0.4 mg by mouth daily after breakfast. Reported on 11/14/2015  . UNABLE TO FIND Please add on a lipid and CMP for hyperlipidemia (E78.5) and a TSH for hypothyroidism (E03.9) if possible. Please fax add on results to Dr. Hollace Kinnier at [phone]   No facility-administered encounter medications on file as of 08/09/2018.     Activities of Daily Living In your present state of health,  do you have any difficulty performing the following activities: 08/09/2018  Hearing? N  Vision? N  Difficulty concentrating or making decisions? N  Walking or climbing stairs? Y  Dressing or bathing? N  Doing errands, shopping? Y  Preparing Food and eating ? N  Using the Toilet? N  In the past six months, have you accidently leaked urine? Y  Do you have problems with loss of bowel control? N  Managing your Medications? Y  Managing your Finances? Y  Housekeeping or managing your Housekeeping? Y  Some recent data might be hidden    Patient Care Team: Gayland Curry, DO as PCP - General (Geriatric Medicine) [facility]   Assessment:   This is a routine wellness examination for [place].  Exercise Activities and Dietary recommendations Current Exercise Habits: The patient does not participate in regular exercise at present, Exercise limited by: respiratory conditions(s)  Goals   None     Fall Risk Fall Risk  08/09/2018 07/27/2018 12/15/2017 08/11/2017 07/27/2017  Falls in the past year? 0 0 No No No  Number falls in past yr: 0 0 - - -  Injury with Fall? 0 0 - - -   Is the patient's home free of loose throw rugs in walkways, pet beds, electrical cords, etc?   yes      Grab bars in the bathroom? yes      Handrails on the stairs?   yes      Adequate lighting?   yes   Depression Screen PHQ 2/9 Scores 08/09/2018 07/27/2018 12/15/2017 08/11/2017  PHQ - 2 Score 2 0 0 0  PHQ- 9 Score 5 - - -    Cognitive Function MMSE - Mini Mental State Exam 08/09/2018 07/27/2017  Orientation to time 5 5  Orientation to Place 5 5  Registration 3 3  Attention/ Calculation 5 5  Recall 3 3  Language- name 2 objects 2 2  Language- repeat 1 1  Language- follow 3 step command 3 3  Language- read &  follow direction 1 1  Write a sentence 1 1  Copy design 1 1  Total score 30 30        Immunization History  Administered Date(s) Administered  . Influenza Split 07/09/2014,  05/31/2015  . Influenza Whole 09/22/2011  . Influenza, High Dose Seasonal PF 07/01/2017  . Influenza-Unspecified 05/22/2013, 07/05/2014, 06/22/2015, 05/22/2016, 07/07/2018  . Pneumococcal Conjugate-13 04/18/2010  . Pneumococcal Polysaccharide-23 08/11/2017  . Tdap 03/21/2015  . Zoster 04/18/2006    Qualifies for Shingles Vaccine? Yes, educated and ordered to pharmacy  Screening Tests Health Maintenance  Topic Date Due  . Samul Dada  03/20/2025  . INFLUENZA VACCINE  Completed  . PNA vac Low Risk Adult  Completed   Cancer Screenings: Lung: Low Dose CT Chest recommended if Age 78-80 years, 30 pack-year currently smoking OR have quit w/in 15years. Patient does not qualify. Colorectal: up to date  Additional Screenings:  Hepatitis C Screening: declined      Plan:    I have personally reviewed and addressed the Medicare Annual Wellness questionnaire and have noted the following in the patient's chart:  A. Medical and social history B. Use of alcohol, tobacco or illicit drugs  C. Current medications and supplements D. Functional ability and status E.  Nutritional status F.  Physical activity G. Advance directives H. List of other physicians I.  Hospitalizations, surgeries, and ER visits in previous 12 months J.  [place] to include hearing, vision, cognitive, depression L. Referrals and appointments - none  In addition, I have reviewed and discussed with patient certain preventive protocols, quality metrics, and best practice recommendations. A written personalized care plan for preventive services as well as general preventive health recommendations were provided to patient.  See attached scanned questionnaire for additional information.   Signed,   Tyson Dense, RN Nurse Health Advisor  Patient Concerns: Always feels cold and tired. Looking into getting a wheelchair to help when he feels SOB

## 2018-08-10 ENCOUNTER — Inpatient Hospital Stay: Payer: Medicare Other

## 2018-08-10 VITALS — BP 116/55 | HR 64 | Temp 97.4°F | Resp 18

## 2018-08-10 DIAGNOSIS — D509 Iron deficiency anemia, unspecified: Secondary | ICD-10-CM | POA: Diagnosis not present

## 2018-08-10 DIAGNOSIS — D649 Anemia, unspecified: Secondary | ICD-10-CM

## 2018-08-10 DIAGNOSIS — N189 Chronic kidney disease, unspecified: Secondary | ICD-10-CM | POA: Diagnosis not present

## 2018-08-10 DIAGNOSIS — D631 Anemia in chronic kidney disease: Secondary | ICD-10-CM | POA: Diagnosis not present

## 2018-08-10 DIAGNOSIS — Z923 Personal history of irradiation: Secondary | ICD-10-CM | POA: Diagnosis not present

## 2018-08-10 DIAGNOSIS — N183 Chronic kidney disease, stage 3 (moderate): Secondary | ICD-10-CM

## 2018-08-10 DIAGNOSIS — C61 Malignant neoplasm of prostate: Secondary | ICD-10-CM | POA: Diagnosis not present

## 2018-08-10 DIAGNOSIS — E291 Testicular hypofunction: Secondary | ICD-10-CM | POA: Diagnosis not present

## 2018-08-10 LAB — CBC WITH DIFFERENTIAL/PLATELET
ABS IMMATURE GRANULOCYTES: 0.02 10*3/uL (ref 0.00–0.07)
BASOS ABS: 0 10*3/uL (ref 0.0–0.1)
BASOS PCT: 0 %
Eosinophils Absolute: 0 10*3/uL (ref 0.0–0.5)
Eosinophils Relative: 0 %
HEMATOCRIT: 31.4 % — AB (ref 39.0–52.0)
Hemoglobin: 8.8 g/dL — ABNORMAL LOW (ref 13.0–17.0)
IMMATURE GRANULOCYTES: 0 %
LYMPHS ABS: 0.3 10*3/uL — AB (ref 0.7–4.0)
Lymphocytes Relative: 3 %
MCH: 24.7 pg — ABNORMAL LOW (ref 26.0–34.0)
MCHC: 28 g/dL — ABNORMAL LOW (ref 30.0–36.0)
MCV: 88.2 fL (ref 80.0–100.0)
MONOS PCT: 10 %
Monocytes Absolute: 0.9 10*3/uL (ref 0.1–1.0)
NEUTROS ABS: 8 10*3/uL — AB (ref 1.7–7.7)
NEUTROS PCT: 87 %
NRBC: 0 % (ref 0.0–0.2)
Platelets: 317 10*3/uL (ref 150–400)
RBC: 3.56 MIL/uL — ABNORMAL LOW (ref 4.22–5.81)
RDW: 21.8 % — ABNORMAL HIGH (ref 11.5–15.5)
WBC: 9.3 10*3/uL (ref 4.0–10.5)

## 2018-08-10 LAB — SAMPLE TO BLOOD BANK

## 2018-08-10 MED ORDER — SODIUM CHLORIDE 0.9 % IV SOLN
510.0000 mg | Freq: Once | INTRAVENOUS | Status: AC
  Administered 2018-08-10: 510 mg via INTRAVENOUS
  Filled 2018-08-10: qty 17

## 2018-08-10 MED ORDER — SODIUM CHLORIDE 0.9 % IV SOLN
Freq: Once | INTRAVENOUS | Status: AC
  Administered 2018-08-10: 16:00:00 via INTRAVENOUS
  Filled 2018-08-10: qty 250

## 2018-08-10 MED ORDER — DARBEPOETIN ALFA 300 MCG/0.6ML IJ SOSY: 300.0000 ug | PREFILLED_SYRINGE | Freq: Once | INTRAMUSCULAR | Status: DC

## 2018-08-10 NOTE — Patient Instructions (Signed)

## 2018-08-12 ENCOUNTER — Encounter: Payer: Self-pay | Admitting: Internal Medicine

## 2018-08-16 ENCOUNTER — Telehealth (HOSPITAL_COMMUNITY): Payer: Self-pay

## 2018-08-17 ENCOUNTER — Inpatient Hospital Stay (HOSPITAL_BASED_OUTPATIENT_CLINIC_OR_DEPARTMENT_OTHER): Payer: Medicare Other | Admitting: Oncology

## 2018-08-17 ENCOUNTER — Inpatient Hospital Stay: Payer: Medicare Other

## 2018-08-17 VITALS — BP 105/40 | HR 65 | Temp 97.5°F | Resp 17 | Ht 67.0 in | Wt 240.7 lb

## 2018-08-17 DIAGNOSIS — C61 Malignant neoplasm of prostate: Secondary | ICD-10-CM | POA: Diagnosis not present

## 2018-08-17 DIAGNOSIS — D649 Anemia, unspecified: Secondary | ICD-10-CM

## 2018-08-17 DIAGNOSIS — D631 Anemia in chronic kidney disease: Secondary | ICD-10-CM

## 2018-08-17 DIAGNOSIS — N189 Chronic kidney disease, unspecified: Secondary | ICD-10-CM | POA: Diagnosis not present

## 2018-08-17 DIAGNOSIS — E291 Testicular hypofunction: Secondary | ICD-10-CM | POA: Diagnosis not present

## 2018-08-17 DIAGNOSIS — D509 Iron deficiency anemia, unspecified: Secondary | ICD-10-CM | POA: Diagnosis not present

## 2018-08-17 DIAGNOSIS — N183 Chronic kidney disease, stage 3 unspecified: Secondary | ICD-10-CM

## 2018-08-17 DIAGNOSIS — Z923 Personal history of irradiation: Secondary | ICD-10-CM | POA: Diagnosis not present

## 2018-08-17 LAB — CBC WITH DIFFERENTIAL/PLATELET
Abs Immature Granulocytes: 0.03 10*3/uL (ref 0.00–0.07)
BASOS ABS: 0 10*3/uL (ref 0.0–0.1)
BASOS PCT: 0 %
EOS ABS: 0.1 10*3/uL (ref 0.0–0.5)
EOS PCT: 1 %
HCT: 33.9 % — ABNORMAL LOW (ref 39.0–52.0)
HEMOGLOBIN: 9.4 g/dL — AB (ref 13.0–17.0)
Immature Granulocytes: 0 %
Lymphocytes Relative: 3 %
Lymphs Abs: 0.3 10*3/uL — ABNORMAL LOW (ref 0.7–4.0)
MCH: 25.1 pg — AB (ref 26.0–34.0)
MCHC: 27.7 g/dL — AB (ref 30.0–36.0)
MCV: 90.6 fL (ref 80.0–100.0)
MONO ABS: 0.7 10*3/uL (ref 0.1–1.0)
Monocytes Relative: 9 %
NRBC: 0 % (ref 0.0–0.2)
Neutro Abs: 7.1 10*3/uL (ref 1.7–7.7)
Neutrophils Relative %: 87 %
Platelets: 264 10*3/uL (ref 150–400)
RBC: 3.74 MIL/uL — AB (ref 4.22–5.81)
RDW: 23.2 % — AB (ref 11.5–15.5)
WBC: 8.2 10*3/uL (ref 4.0–10.5)

## 2018-08-17 LAB — SAMPLE TO BLOOD BANK

## 2018-08-17 MED ORDER — DARBEPOETIN ALFA 300 MCG/0.6ML IJ SOSY
PREFILLED_SYRINGE | INTRAMUSCULAR | Status: AC
  Filled 2018-08-17: qty 0.6

## 2018-08-17 MED ORDER — DARBEPOETIN ALFA 300 MCG/0.6ML IJ SOSY
300.0000 ug | PREFILLED_SYRINGE | Freq: Once | INTRAMUSCULAR | Status: AC
  Administered 2018-08-17: 300 ug via SUBCUTANEOUS

## 2018-08-17 NOTE — Progress Notes (Signed)
Hematology and Oncology Follow Up Visit  [name] [id number] 24-Dec-1929 84 y.o. 08/17/2018 10:17 AM [name], [name], DOReed, [name], DO   Principle Diagnosis: 84 year old with the:   1.  Prostate cancer diagnosed in September 2017.  He is found to have T2c disease with PSA of 15.9 and Gleason score 4+5 = 9.   2.  Anemia: Diagnosed in 2018 found to have anemia of renal disease as well as iron deficiency.   Prior Therapy:   He is status post androgen deprivation therapy followed by radiation therapy completed in December 2017. Androgen deprivation therapy to be completed in 2019.  He is status post a bone marrow biopsy in April 2018 which did not show any myelodysplasia.  Current therapy: Aranesp 300 mcg every 2 weeks started in October 2018.  He is also receiving intermittent intravenous iron as needed.  Interim History:  [name] is here for a follow-up visit.  Since the last visit, he received intravenous iron which she has tolerated without any complications.  Since the last visit, he reports no major changes in his health.  He continues to be quite debilitated from a respiratory status including dyspnea and exertion and decrease in exercise tolerance.  Continues to have chronic lower extremity edema which is unchanged.  He does use a walker and wheelchair for the most part.  He denies any dyspnea on exertion at rest.  He does not report any headaches, blurry vision, syncope or seizures.  He denies any confusion or lethargy. He does not report any fevers, chills, sweats or weight loss. He does not report any cough, wheezing or hemoptysis. He does not report any nausea, vomiting or early satiety.  Denies any constipation or diarrhea.  He does not report any frequency urgency or hesitancy. He does not report any hematuria or dysuria. He does not report any arthralgias or myalgias.  He does not report any mood changes.  Remaining review of systems is negative.    Medications: I have  reviewed the patient's current medications.  Current Outpatient Medications  Medication Sig Dispense Refill  . acyclovir (ZOVIRAX) 400 MG tablet Take 400 mg 5 (five) times daily by mouth.    Marland Kitchen albuterol (PROAIR HFA) 108 (90 Base) MCG/ACT inhaler Inhale 2 puffs into the lungs every 6 (six) hours as needed for wheezing or shortness of breath.    Marland Kitchen atorvastatin (LIPITOR) 80 MG tablet Take 80 mg by mouth every morning. Take one daily for cholesterol    . calcium citrate (CALCITRATE - DOSED IN MG ELEMENTAL CALCIUM) 950 MG tablet Take 200 mg of elemental calcium daily by mouth.    . Darbepoetin Alfa 300 MCG/ML SOLN Inject into the skin.    . fexofenadine (ALLEGRA) 60 MG tablet Take 60 mg by mouth daily.    . furosemide (LASIX) 20 MG tablet Take 1 tablet (20 mg total) by mouth daily. 30 tablet 3  . Glycopyrrolate-Formoterol (BEVESPI AEROSPHERE) 9-4.8 MCG/ACT AERO Inhale 2 puffs into the lungs 2 (two) times daily. 1 Inhaler 11  . levothyroxine (SYNTHROID, LEVOTHROID) 25 MCG tablet TAKE ONE TABLET BY MOUTH ONCE DAILY BEFORE BREAKFAST 90 tablet 1  . metoprolol succinate (TOPROL-XL) 50 MG 24 hr tablet Take 50 mg by mouth every morning. Take one tablet daily for blood pressure    . Multiple Vitamins-Minerals (CENTRUM SILVER ADULT 50+) TABS Take 1 tablet by mouth every morning.    Marland Kitchen omeprazole (PRILOSEC) 40 MG capsule Take 40 mg by mouth daily.    Marland Kitchen  tamsulosin (FLOMAX) 0.4 MG CAPS capsule Take 0.4 mg by mouth daily after breakfast. Reported on 11/14/2015    . UNABLE TO FIND Please add on a lipid and CMP for hyperlipidemia (E78.5) and a TSH for hypothyroidism (E03.9) if possible. Please fax add on results to Dr. Hollace Kinnier at [phone]     No current facility-administered medications for this visit.      Allergies:  Allergies  Allergen Reactions  . Ketoconazole Other (See Comments)    Adverse skin reaction-- legs turned red and purple    Past Medical History, Surgical history, Social history  reviewed without any changes.  He denied any smoking or alcohol abuse.  Review of Systems:  Physical Exam:   ECOG: 2   General appearance: Comfortable appearing without any discomfort Head: Normocephalic without any trauma Oropharynx: Mucous membranes are moist and pink without any thrush or ulcers. Eyes: Pupils are equal and round reactive to light. Lymph nodes: No cervical, supraclavicular, inguinal or axillary lymphadenopathy.   Heart:regular rate and rhythm.  S1 and S2 with bilateral lower extremity edema. Lung: Clear without any rhonchi or wheezes.  No dullness to percussion. Abdomin: Soft, nontender, nondistended with good bowel sounds.  No hepatosplenomegaly. Musculoskeletal: No joint deformity or effusion.  Full range of motion noted. Neurological: No deficits noted on motor, sensory and deep tendon reflex exam. Skin: No petechial rash or dryness.  Appeared moist.  Psychiatric: Mood and affect appeared appropriate.      Lab Results: Lab Results  Component Value Date   WBC 9.3 08/10/2018   HGB 8.8 (L) 08/10/2018   HCT 31.4 (L) 08/10/2018   MCV 88.2 08/10/2018   PLT 317 08/10/2018     Chemistry      Component Value Date/Time   NA 147 07/28/2018   NA 146 (H) 11/26/2016 1156   K 4.3 07/28/2018   K 4.1 11/26/2016 1156   CL 108 12/21/2016 0931   CO2 26 12/21/2016 0931   CO2 27 11/26/2016 1156   BUN 39 (A) 07/28/2018   BUN 32.3 (H) 11/26/2016 1156   CREATININE 1.9 (A) 07/28/2018   CREATININE 1.56 (H) 12/21/2016 0931   CREATININE 1.8 (H) 11/26/2016 1156   GLU 99 07/28/2018      Component Value Date/Time   CALCIUM 9.4 12/21/2016 0931   CALCIUM 9.7 11/26/2016 1156   ALKPHOS 93 07/28/2018   ALKPHOS 102 11/26/2016 1156   AST 19 07/28/2018   AST 20 11/26/2016 1156   ALT 15 07/28/2018   ALT 19 11/26/2016 1156   BILITOT 0.52 11/26/2016 1156       Impression and Plan:  84 year old man with:  1.  Prostate cancer diagnosed in 2017.  He resented with T2c  organ confined disease that is currently in remission.  He has no evidence to suggest recurrent disease at this time and remains on active surveillance.  2.  Anemia: Related to chronically insufficiency as well as blood loss.  He is currently on Aranesp 300 mcg every 2 to 3 weeks to keep his hemoglobin above 10.  He is also been receiving intravenous iron as needed.  His hemoglobin is 9.4 today and does not require any transfusion.  We will continue to monitor his iron studies and replace as needed.  He will receive Aranesp 300 mcg today and every 2 weeks to get his hemoglobin above 10.      3. Follow-up: We will continue every 2 weeks for Aranesp and MD follow-up in 6 weeks.  15 minutes  was spent face-to-face today.  More than 50% of the encounter was dedicated to reviewing laboratory data, treatment options and answering questions regarding plan of care.  Zola Button, MD 11/27/201910:17 AM

## 2018-08-18 DIAGNOSIS — R4020 Unspecified coma: Secondary | ICD-10-CM | POA: Diagnosis not present

## 2018-08-18 DIAGNOSIS — R092 Respiratory arrest: Secondary | ICD-10-CM | POA: Diagnosis not present

## 2018-08-18 DIAGNOSIS — I469 Cardiac arrest, cause unspecified: Secondary | ICD-10-CM | POA: Diagnosis not present

## 2018-08-18 DIAGNOSIS — J449 Chronic obstructive pulmonary disease, unspecified: Secondary | ICD-10-CM | POA: Diagnosis not present

## 2018-08-18 DIAGNOSIS — I509 Heart failure, unspecified: Secondary | ICD-10-CM | POA: Diagnosis not present

## 2018-08-19 ENCOUNTER — Telehealth: Payer: Self-pay

## 2018-08-19 NOTE — Telephone Encounter (Signed)
Patient spouse called and stated that the patient died while in [place] for the Thanksgiving holiday.

## 2018-08-20 ENCOUNTER — Telehealth: Payer: Self-pay | Admitting: Internal Medicine

## 2018-08-20 NOTE — Telephone Encounter (Signed)
DON sent me a message to let me know that Kevin Kane had passed away while with family in [place] over the Thanksgiving holiday.

## 2018-08-21 DEATH — deceased

## 2018-08-22 ENCOUNTER — Ambulatory Visit (HOSPITAL_COMMUNITY): Payer: Medicare Other

## 2018-08-23 ENCOUNTER — Ambulatory Visit: Payer: Medicare Other | Admitting: Internal Medicine

## 2018-08-24 ENCOUNTER — Ambulatory Visit: Payer: Medicare Other

## 2018-08-24 ENCOUNTER — Encounter: Payer: Medicare Other | Admitting: Internal Medicine

## 2018-08-24 ENCOUNTER — Other Ambulatory Visit: Payer: Medicare Other

## 2018-09-08 ENCOUNTER — Ambulatory Visit: Payer: Medicare Other

## 2018-09-08 ENCOUNTER — Other Ambulatory Visit: Payer: Medicare Other

## 2018-09-08 ENCOUNTER — Ambulatory Visit: Payer: Medicare Other | Admitting: Oncology

## 2018-09-22 ENCOUNTER — Ambulatory Visit: Payer: Medicare Other | Admitting: Oncology

## 2018-09-22 ENCOUNTER — Ambulatory Visit: Payer: Medicare Other

## 2018-09-22 ENCOUNTER — Other Ambulatory Visit: Payer: Medicare Other

## 2018-10-06 ENCOUNTER — Other Ambulatory Visit: Payer: Medicare Other

## 2018-10-06 ENCOUNTER — Ambulatory Visit: Payer: Medicare Other

## 2018-10-20 ENCOUNTER — Ambulatory Visit: Payer: Medicare Other

## 2018-10-20 ENCOUNTER — Other Ambulatory Visit: Payer: Medicare Other
